# Patient Record
Sex: Female | Born: 1960 | Race: Black or African American | Hispanic: No | Marital: Single | Smoking: Former smoker
Health system: Southern US, Community
[De-identification: ages and names within clinical notes are randomized; demographics above are authoritative.]

## PROBLEM LIST (undated history)

## (undated) DIAGNOSIS — M5430 Sciatica, unspecified side: Secondary | ICD-10-CM

## (undated) DIAGNOSIS — I10 Essential (primary) hypertension: Secondary | ICD-10-CM

## (undated) DIAGNOSIS — D649 Anemia, unspecified: Secondary | ICD-10-CM

## (undated) DIAGNOSIS — R519 Headache, unspecified: Secondary | ICD-10-CM

## (undated) DIAGNOSIS — G4730 Sleep apnea, unspecified: Secondary | ICD-10-CM

## (undated) DIAGNOSIS — R011 Cardiac murmur, unspecified: Secondary | ICD-10-CM

## (undated) DIAGNOSIS — T8859XA Other complications of anesthesia, initial encounter: Secondary | ICD-10-CM

## (undated) DIAGNOSIS — K219 Gastro-esophageal reflux disease without esophagitis: Secondary | ICD-10-CM

## (undated) DIAGNOSIS — G5600 Carpal tunnel syndrome, unspecified upper limb: Secondary | ICD-10-CM

## (undated) DIAGNOSIS — M797 Fibromyalgia: Secondary | ICD-10-CM

## (undated) DIAGNOSIS — M1990 Unspecified osteoarthritis, unspecified site: Secondary | ICD-10-CM

## (undated) DIAGNOSIS — S299XXA Unspecified injury of thorax, initial encounter: Secondary | ICD-10-CM

## (undated) HISTORY — PX: ROTATOR CUFF REPAIR: SHX139

## (undated) HISTORY — PX: BREAST EXCISIONAL BIOPSY: SUR124

## (undated) HISTORY — PX: CARPAL TUNNEL RELEASE: SHX101

## (undated) HISTORY — DX: Fibromyalgia: M79.7

## (undated) HISTORY — PX: PARTIAL HYSTERECTOMY: SHX80

## (undated) HISTORY — PX: TUBAL LIGATION: SHX77

## (undated) HISTORY — PX: NECK SURGERY: SHX720

---

## 2008-11-29 ENCOUNTER — Encounter: Admission: RE | Admit: 2008-11-29 | Discharge: 2008-11-29 | Payer: Self-pay | Admitting: Otolaryngology

## 2008-11-29 IMAGING — CT CT PARANASAL SINUSES LIMITED
1 series · 8 of 10 positions shown, 10 images · non-contrast
Comparison: None.

CLINICAL DATA: Chronic sinusitis.  Headaches.  Facial swelling.
Status post septal perforation.

CT PARANASAL SINUS LIMITED WITHOUT CONTRAST
TECHNIQUE: Multidetector CT images of the paranasal sinuses were
obtained in a single plane without contrast.

[Series 3: coronal soft · axial · 0.33mm/px · z∈[+38,+108]mm · 8 of 10 slices shown, 10 images]
[im 2/10  brain]
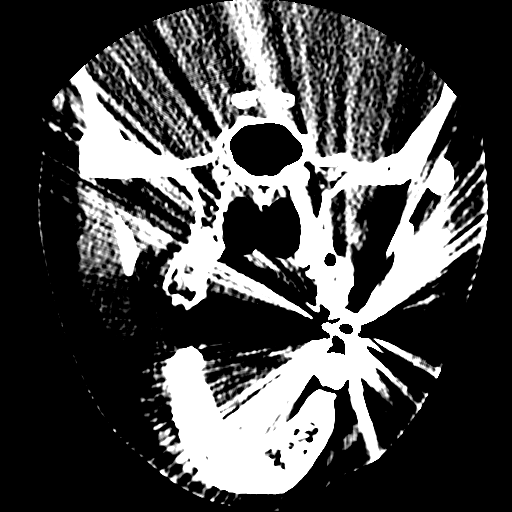
[im 2/10  bone]
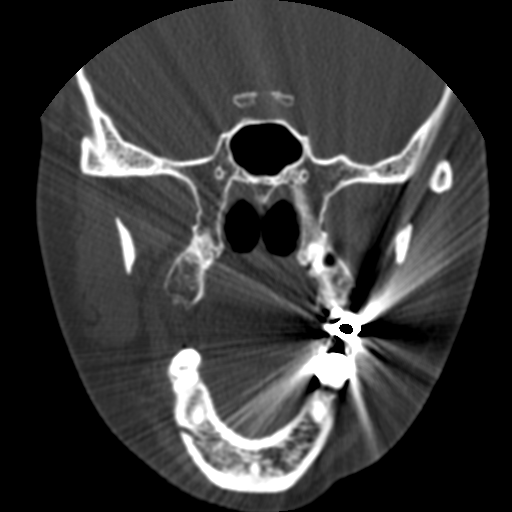
[im 3/10  bone]
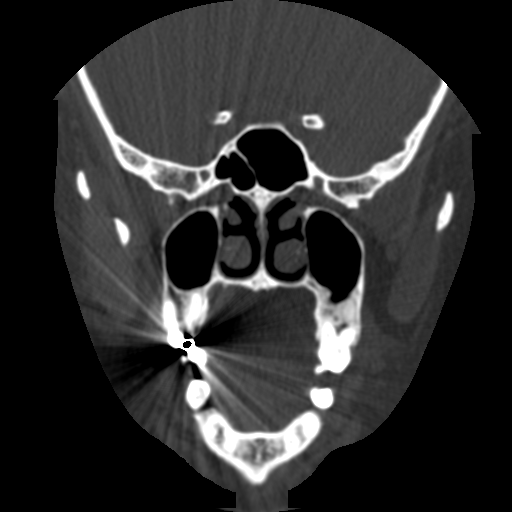
[im 4/10  bone]
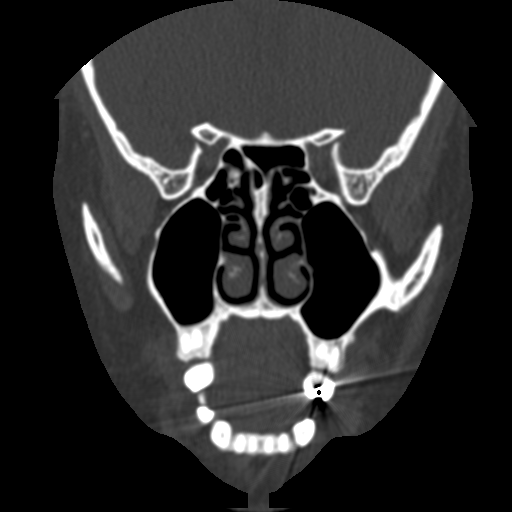
[im 5/10  bone]
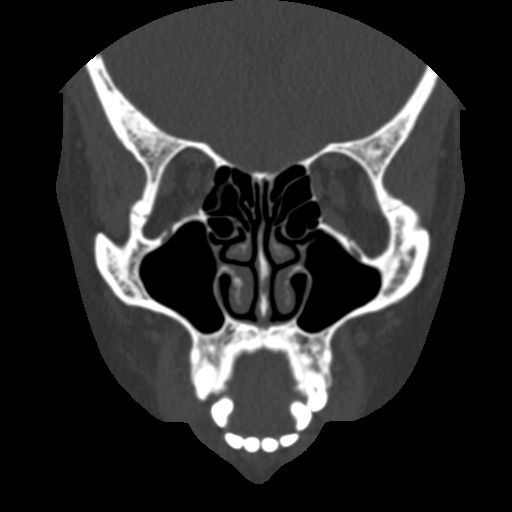
[im 6/10  brain]
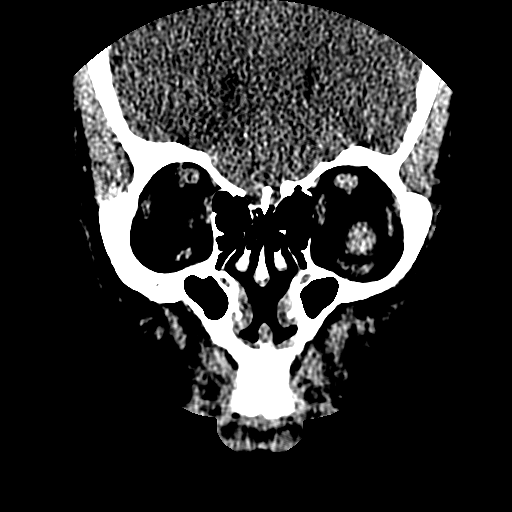
[im 6/10  bone]
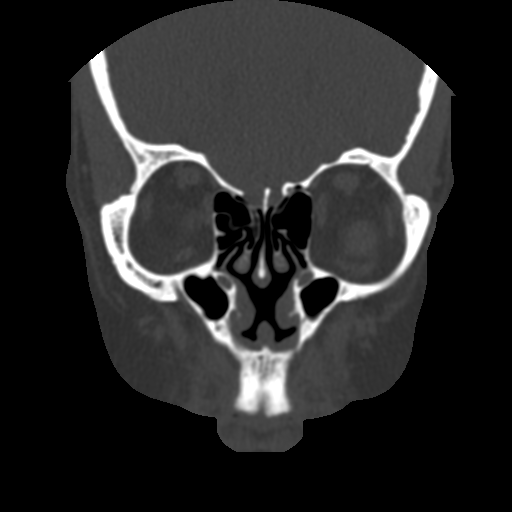
[im 7/10  bone]
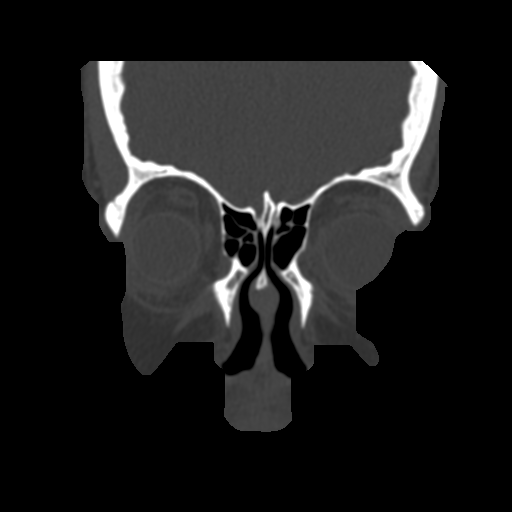
[im 8/10  bone]
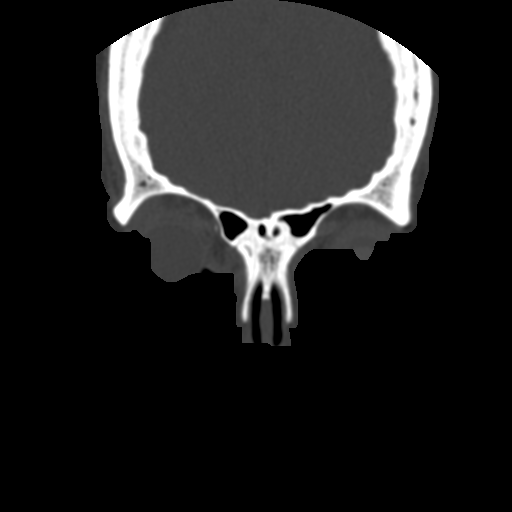
[im 9/10  bone]
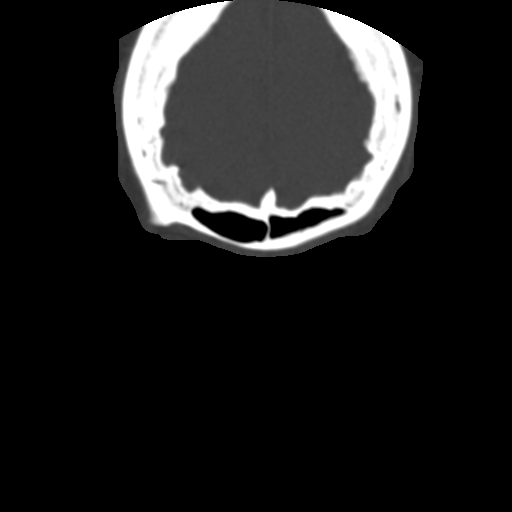

[8 of 10 positions shown; findings below may reference images not displayed]

FINDINGS: Visualized sinuses are clear.  Limited imaging of the
orbital structures and intracranial structures unremarkable.
Calcifications palatine tonsils consistent with prior inflammation.
IMPRESSION: Visualized sinuses are clear.

This has been made a call report.

## 2012-09-02 IMAGING — CR DG WRIST COMPLETE 3+V*R*
1 series · 4 of 4 positions shown · non-contrast
Comparison: None.

CLINICAL DATA: Bilateral wrist pain

EXAM:
RIGHT WRIST - COMPLETE 3+ VIEW

[Series 1: pa · 0.17mm/px · 4 of 4 slices shown]
[im 1/4]
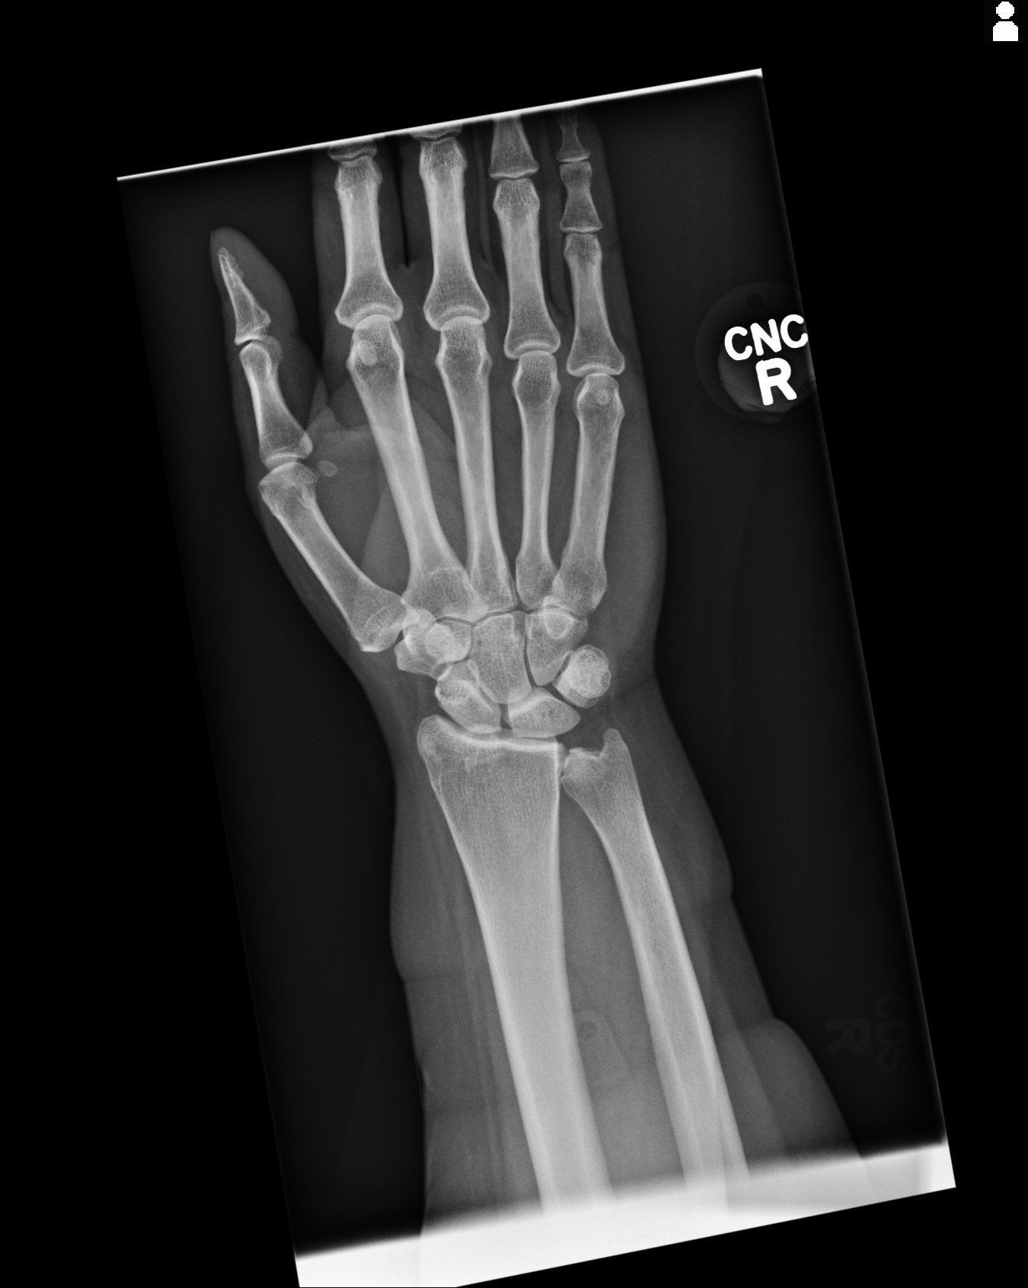
[im 2/4]
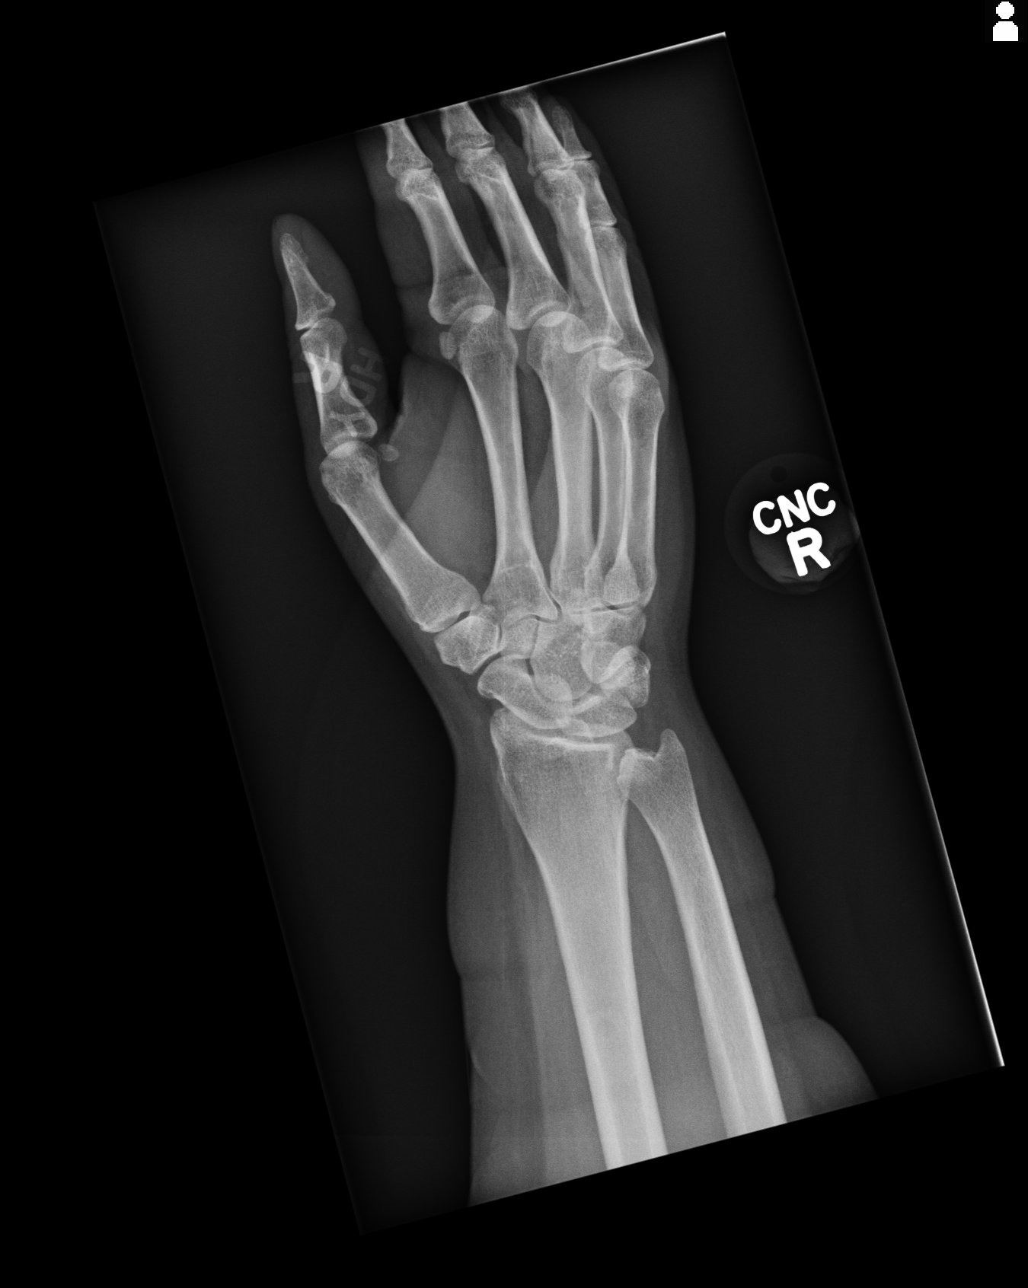
[im 3/4]
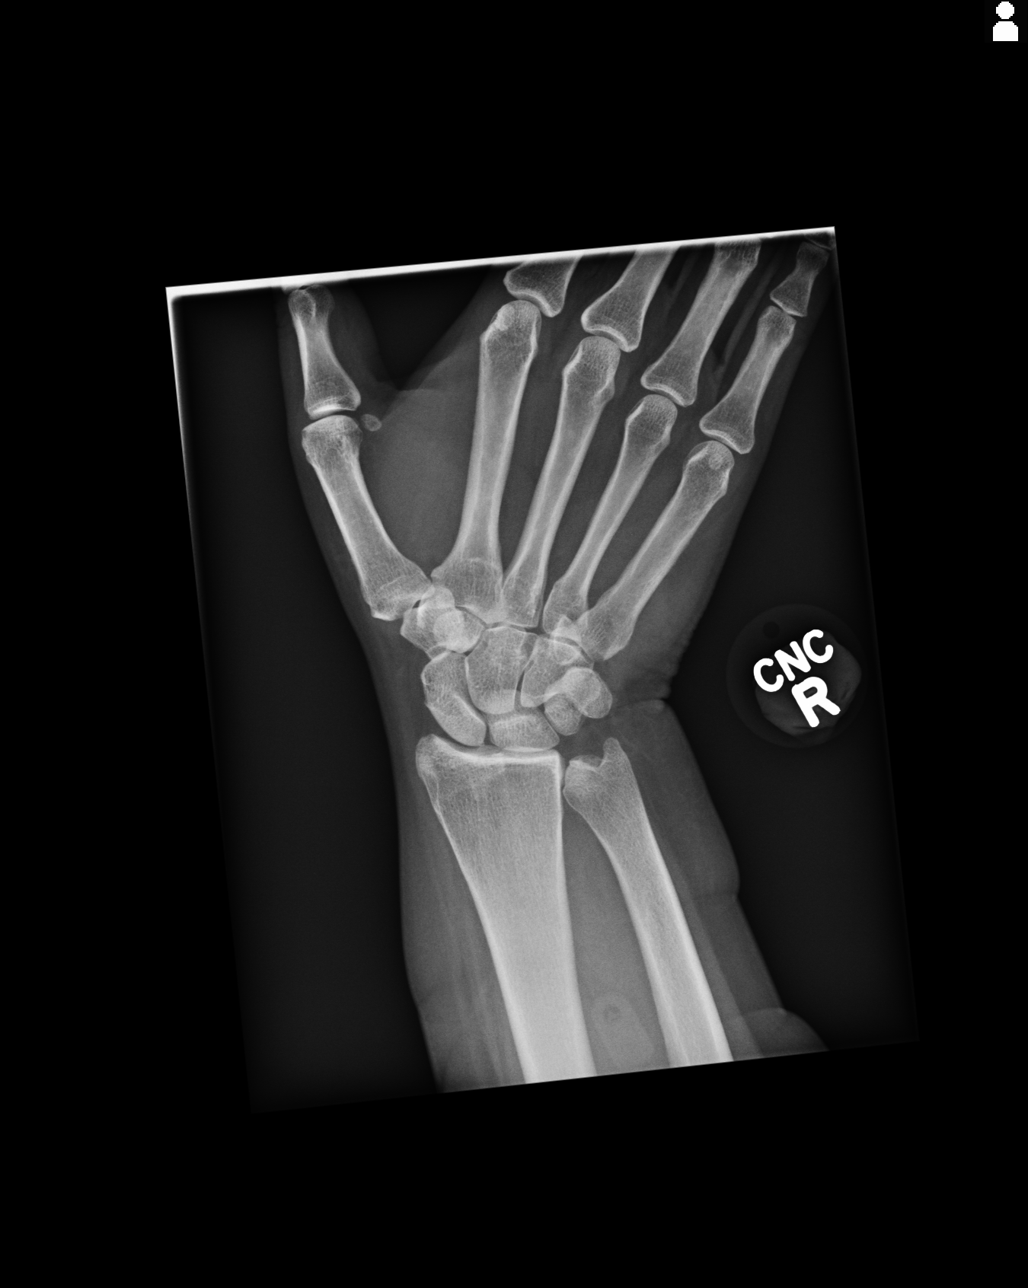
[im 4/4]
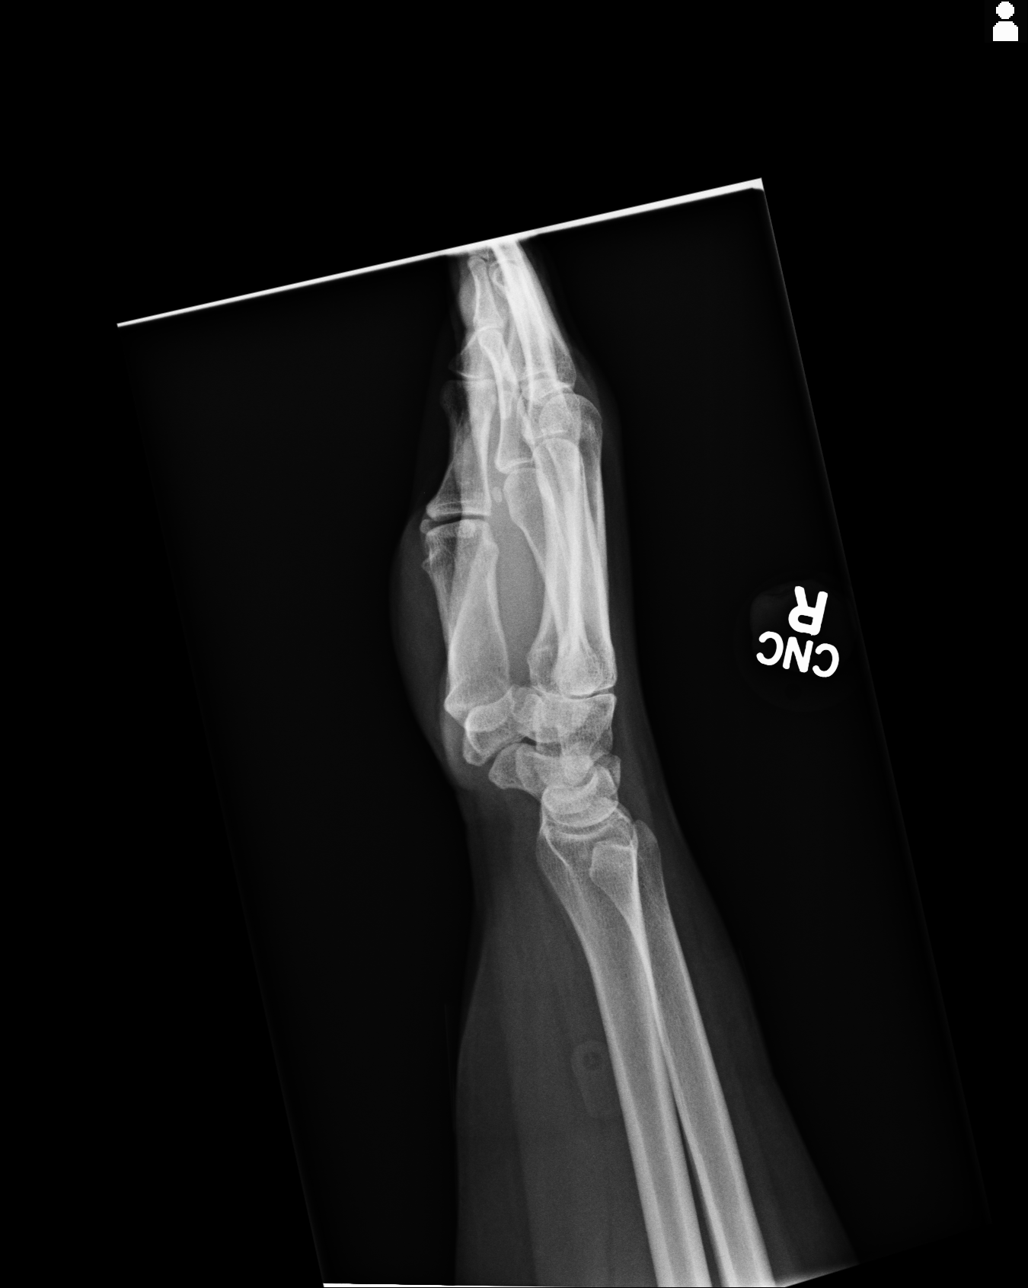

[4 of 4 positions shown; findings below may reference images not displayed]

FINDINGS: No acute fracture or dislocation is noted. No gross soft tissue
abnormality is seen. Slight ulnar minus variant is noted.
IMPRESSION: No acute abnormality seen.

## 2013-05-04 ENCOUNTER — Ambulatory Visit: Payer: Self-pay

## 2013-05-04 IMAGING — CR LEFT WRIST - COMPLETE 3+ VIEW
1 series · 4 of 4 positions shown · non-contrast
Comparison: None.

CLINICAL DATA: Left wrist pain

EXAM:
LEFT WRIST - COMPLETE 3+ VIEW

[Series 1: pa · 0.17mm/px · 4 of 4 slices shown]
[im 1/4]
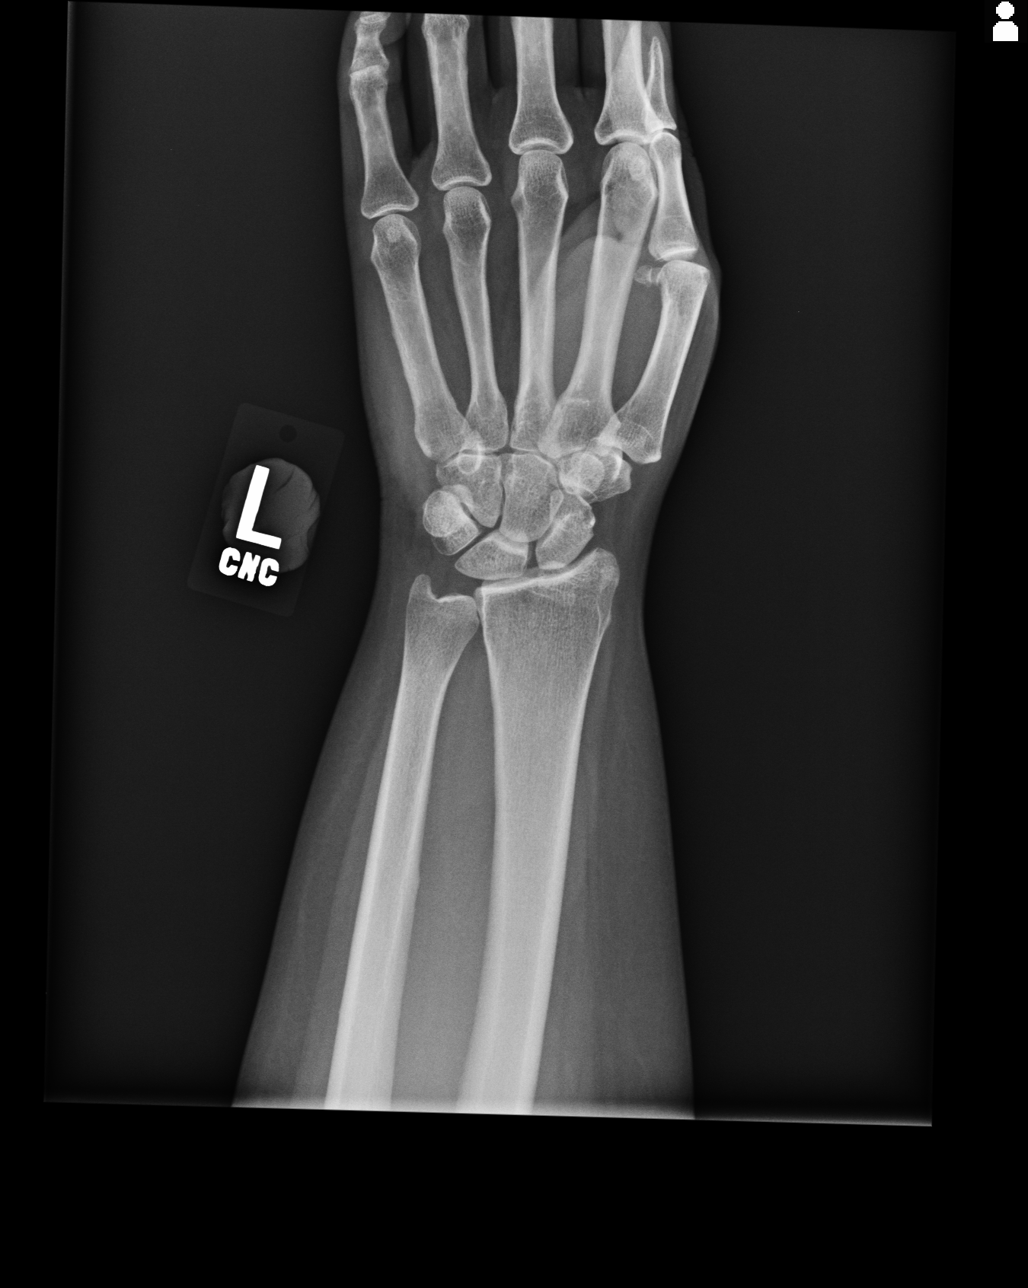
[im 2/4]
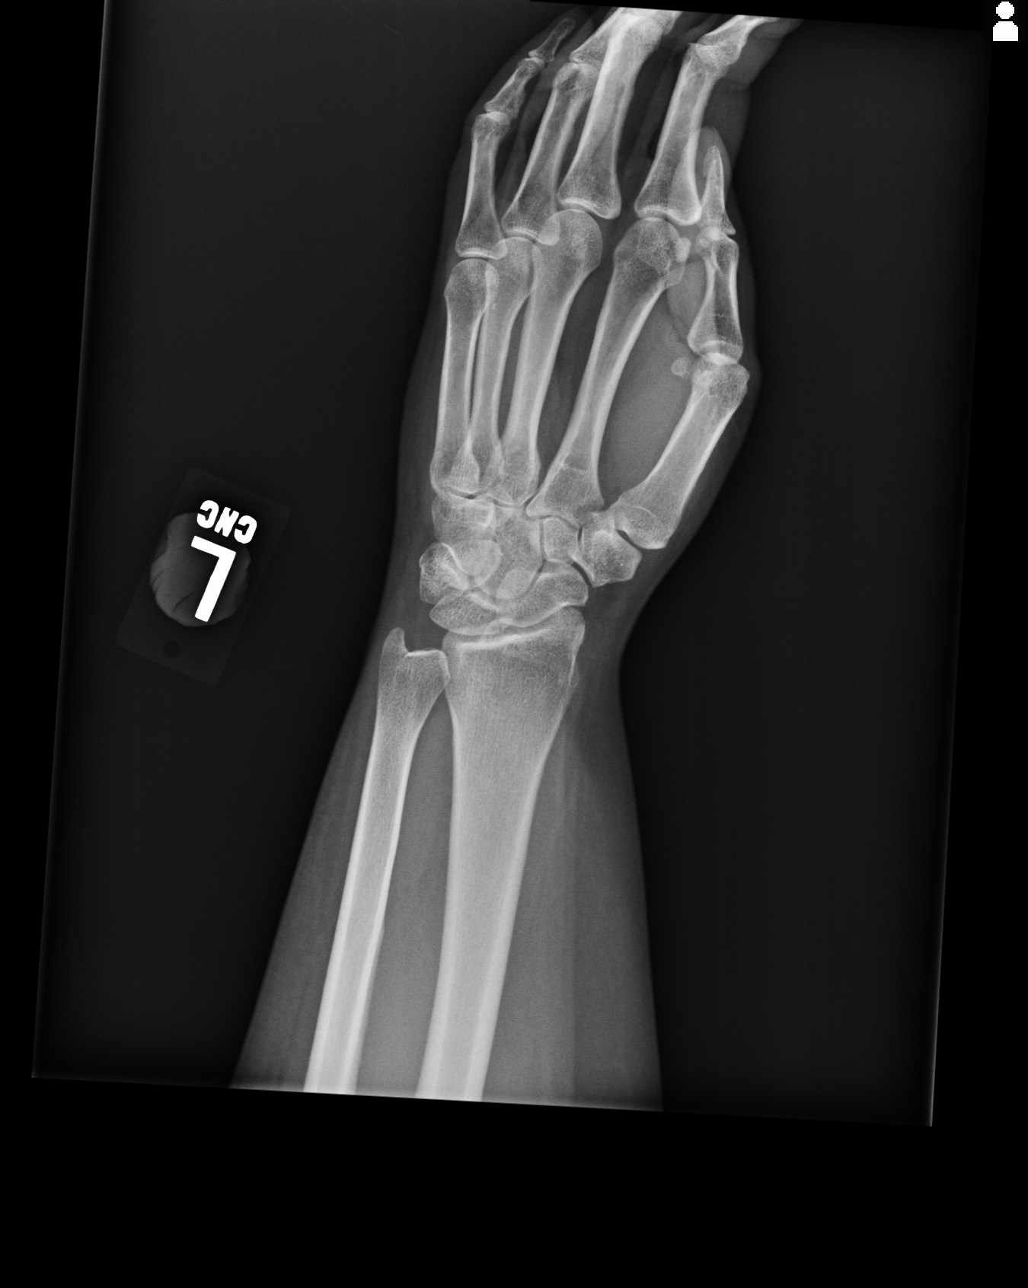
[im 3/4]
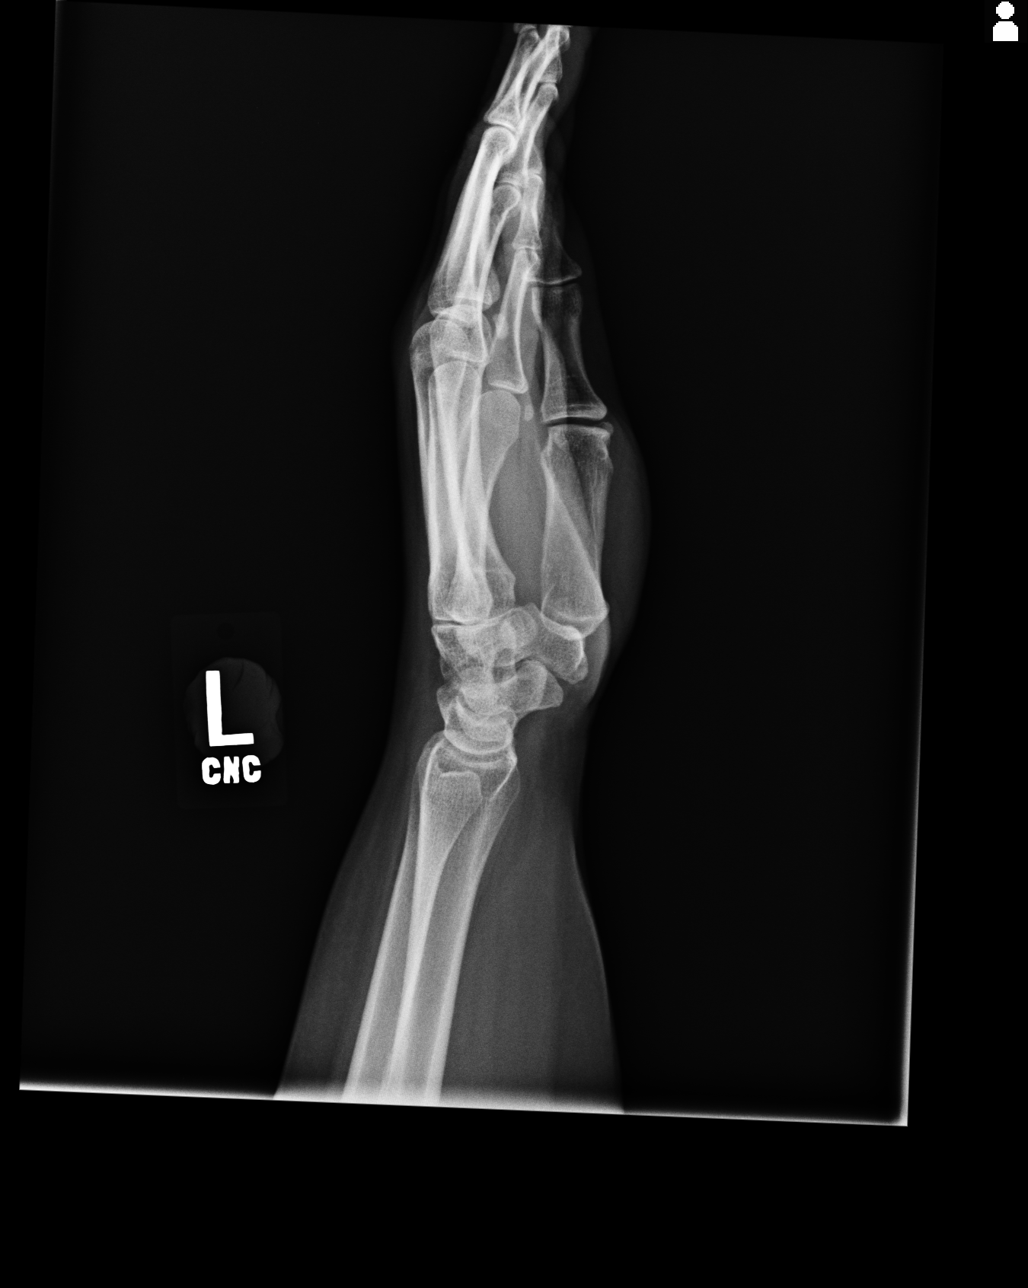
[im 4/4]
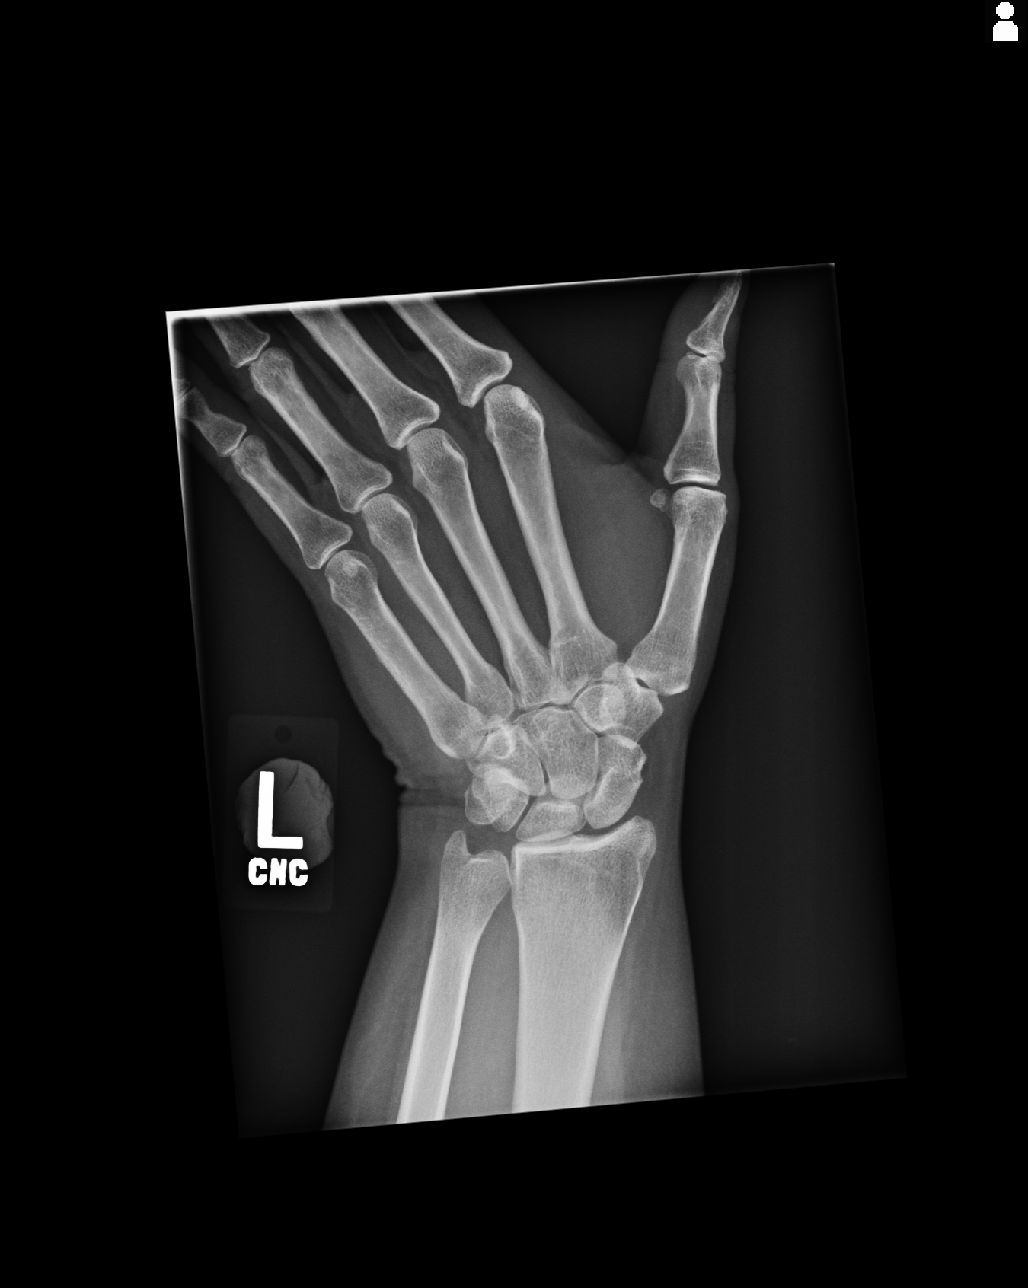

[4 of 4 positions shown; findings below may reference images not displayed]

FINDINGS: There is no evidence of fracture or dislocation. There is no
evidence of arthropathy or other focal bone abnormality. Soft
tissues are unremarkable.
IMPRESSION: Negative.

## 2019-04-01 ENCOUNTER — Other Ambulatory Visit (HOSPITAL_COMMUNITY): Payer: Self-pay | Admitting: Family Medicine

## 2019-04-01 DIAGNOSIS — Z1231 Encounter for screening mammogram for malignant neoplasm of breast: Secondary | ICD-10-CM

## 2019-04-15 ENCOUNTER — Ambulatory Visit (HOSPITAL_COMMUNITY)
Admission: RE | Admit: 2019-04-15 | Discharge: 2019-04-15 | Disposition: A | Discharge status: Home / Self Care | Payer: Medicare Other | Source: Ambulatory Visit | Attending: Family Medicine | Admitting: Family Medicine

## 2019-04-15 ENCOUNTER — Encounter (HOSPITAL_COMMUNITY): Payer: Self-pay | Admitting: Radiology

## 2019-04-15 DIAGNOSIS — Z1231 Encounter for screening mammogram for malignant neoplasm of breast: Secondary | ICD-10-CM | POA: Insufficient documentation

## 2019-04-15 IMAGING — MG DIGITAL SCREENING BILAT W/ TOMO W/ CAD
8 series · 8 of 24 positions shown · non-contrast
Comparison: Previous exam(s).

CLINICAL DATA: Screening.

EXAM:
DIGITAL SCREENING BILATERAL MAMMOGRAM WITH TOMO AND CAD

[R MLO synth-2D]
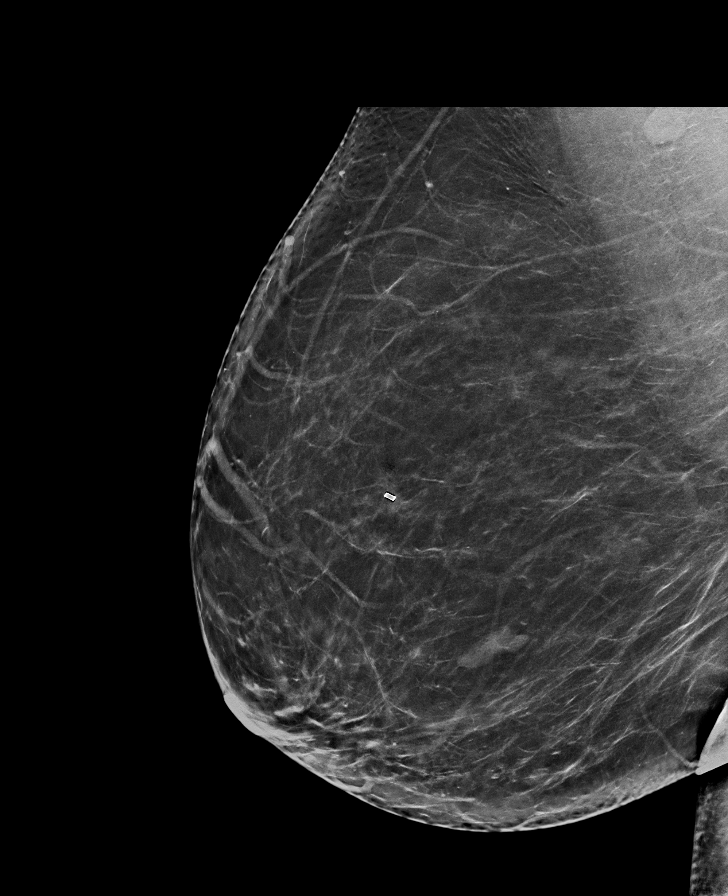

[L MLO synth-2D]
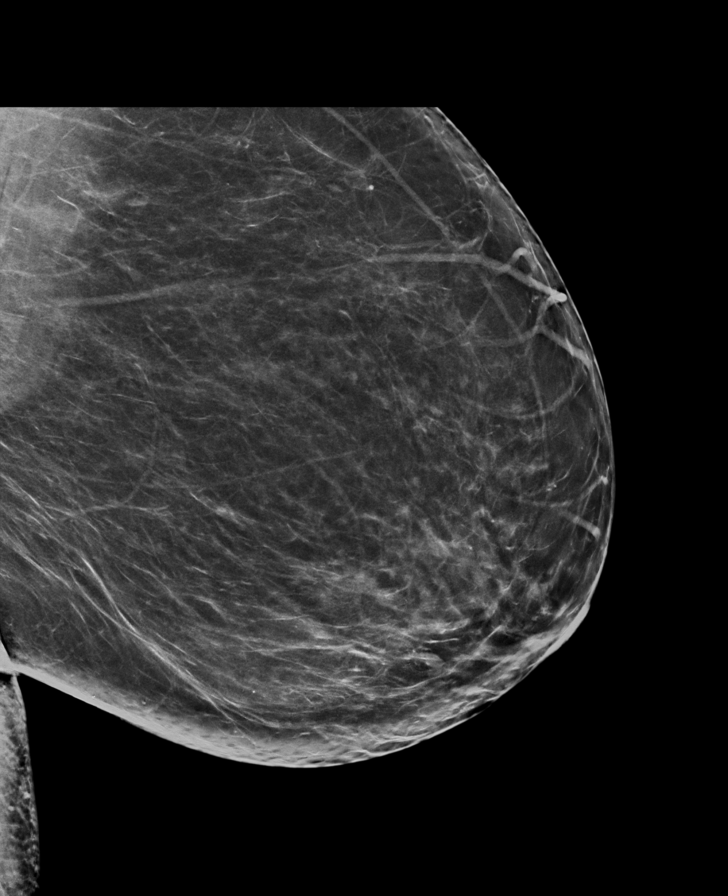

[L CC synth-2D]
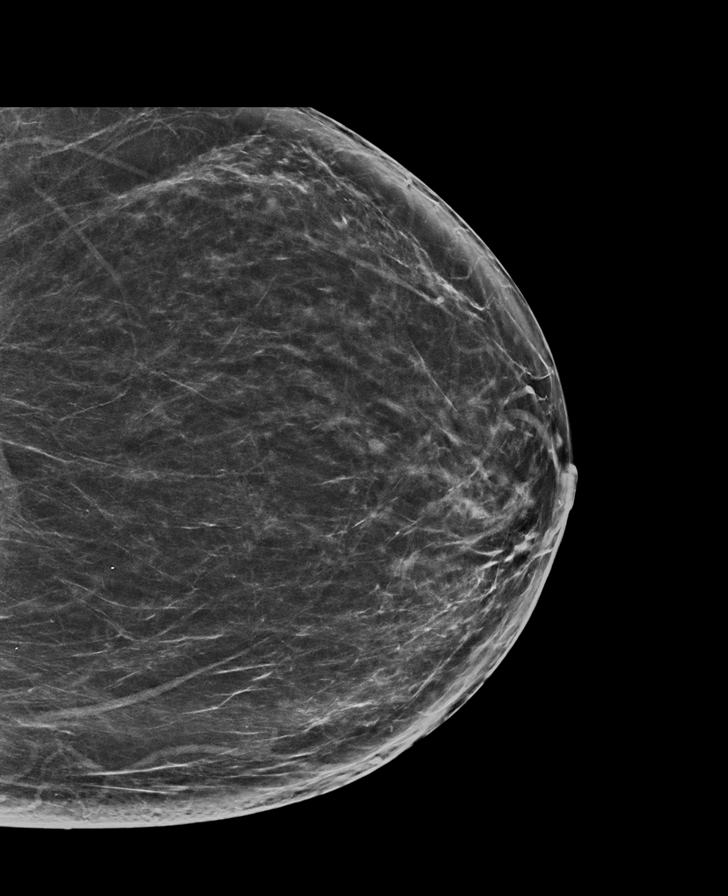

[R CC synth-2D]
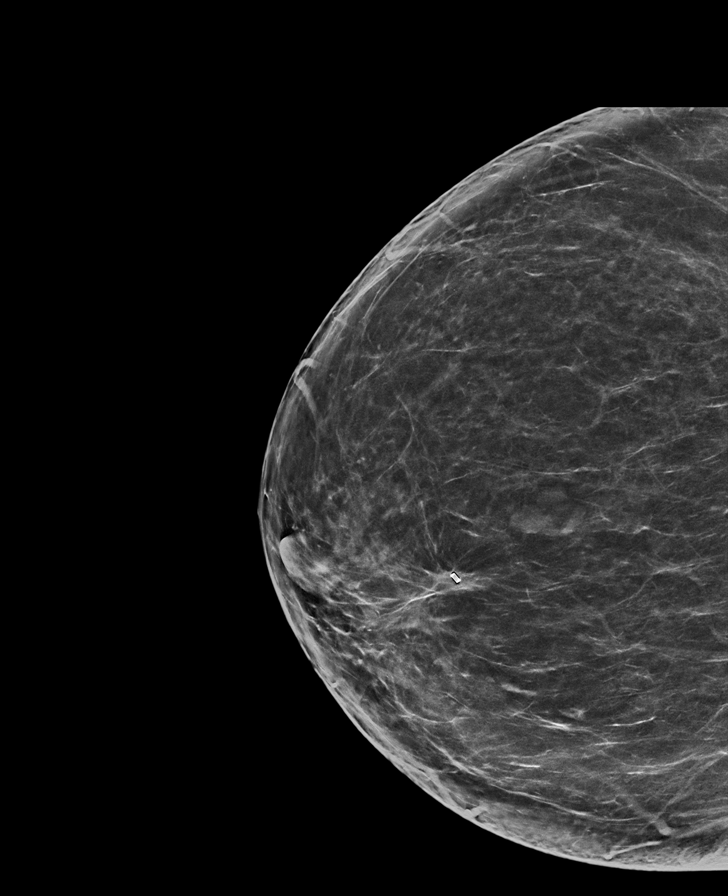

[R MLO tomo · tomo slice 44/87.0]
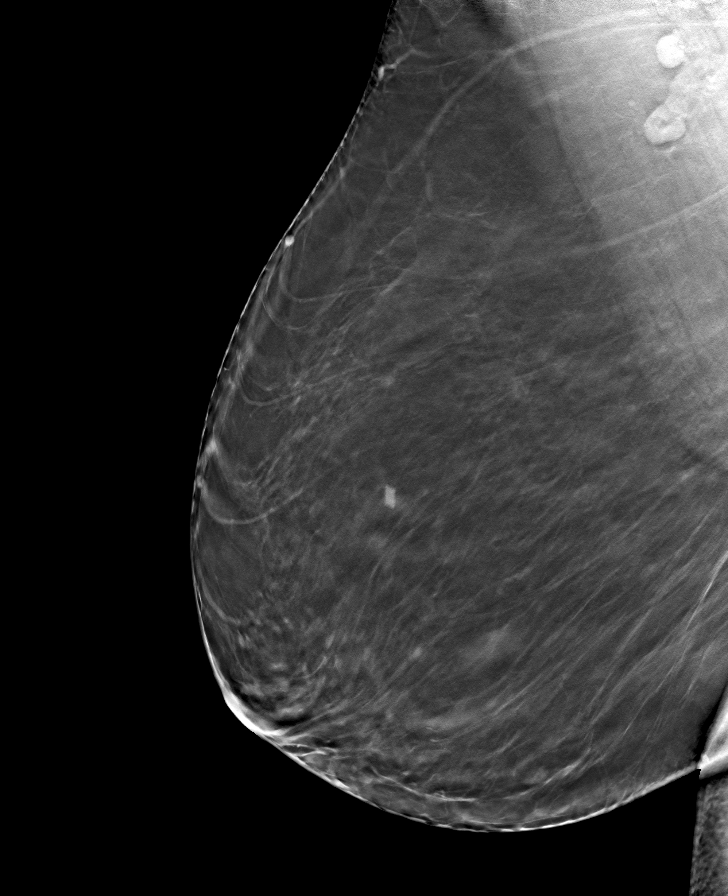

[R CC tomo · tomo slice 41/82.0]
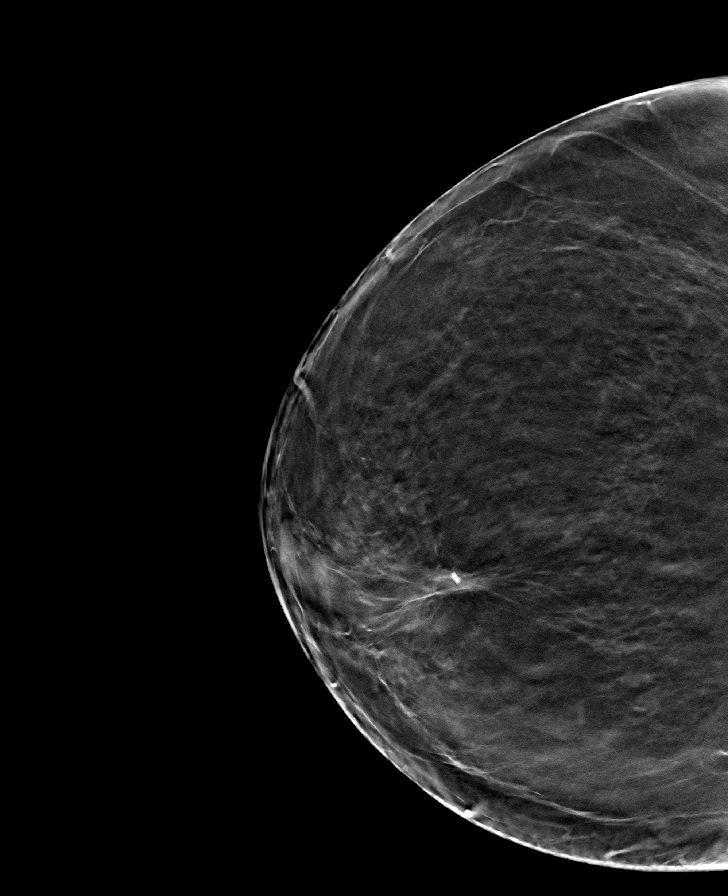

[L CC tomo · tomo slice 41/82.0]
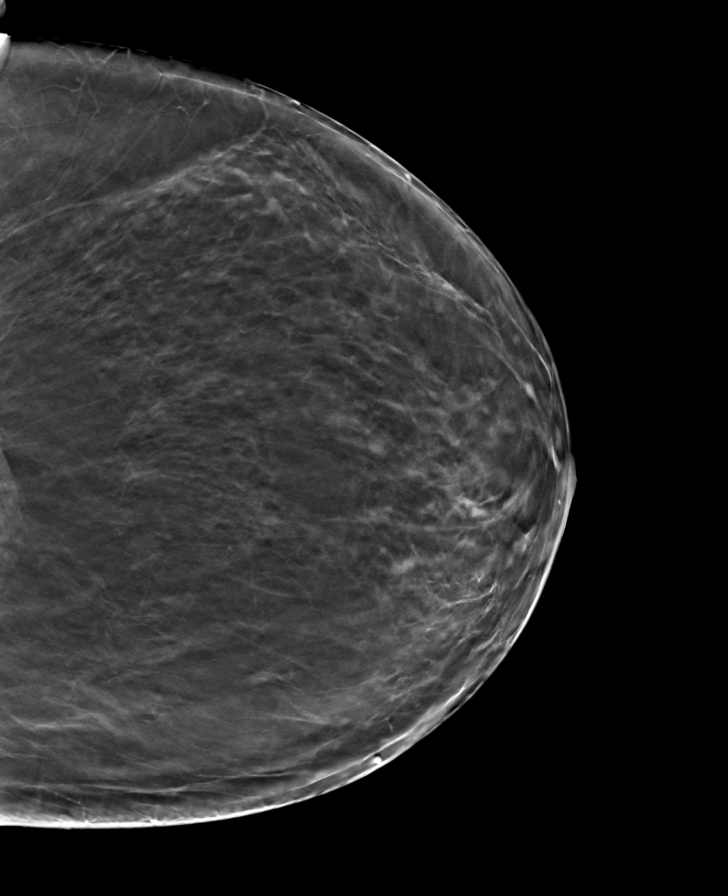

[L MLO tomo · tomo slice 43/85.0]
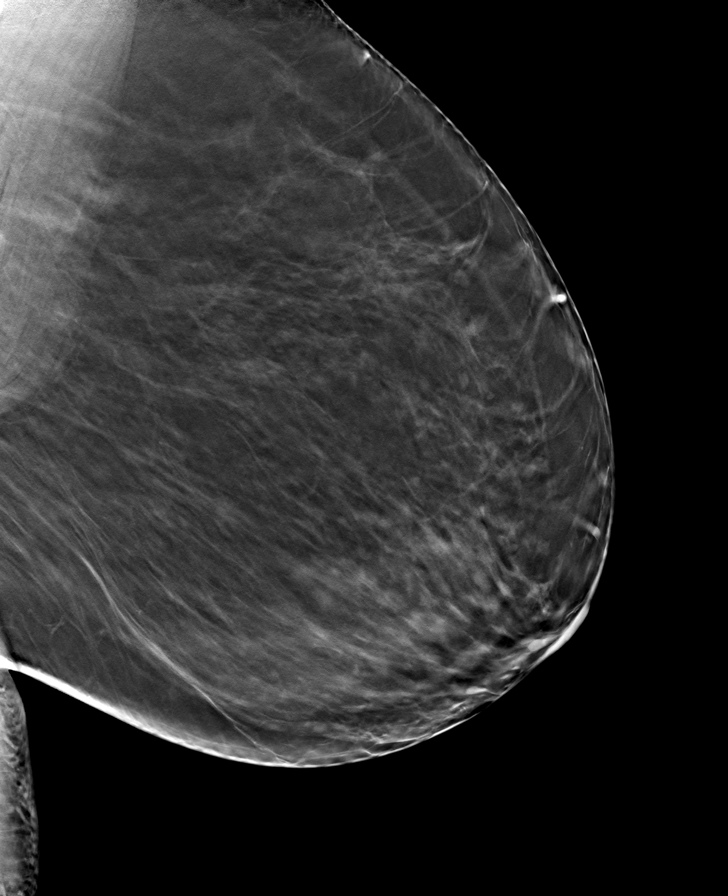

[8 of 24 positions shown; findings below may reference images not displayed]

ACR Breast Density Category b: There are scattered areas of
fibroglandular density.
FINDINGS: There are no findings suspicious for malignancy. Images were
processed with CAD.
IMPRESSION: No mammographic evidence of malignancy. A result letter of this
screening mammogram will be mailed directly to the patient.

RECOMMENDATION:
Screening mammogram in one year. (Code:[TQ])

BI-RADS CATEGORY  1: Negative.

## 2019-04-16 ENCOUNTER — Inpatient Hospital Stay
Admission: RE | Admit: 2019-04-16 | Discharge: 2019-04-16 | Disposition: A | Discharge status: Home / Self Care | Payer: Self-pay | Source: Ambulatory Visit | Attending: Family Medicine | Admitting: Family Medicine

## 2019-04-16 ENCOUNTER — Other Ambulatory Visit (HOSPITAL_COMMUNITY): Payer: Self-pay | Admitting: Family Medicine

## 2019-04-16 DIAGNOSIS — Z1231 Encounter for screening mammogram for malignant neoplasm of breast: Secondary | ICD-10-CM

## 2019-04-21 ENCOUNTER — Ambulatory Visit: Payer: Medicare Other

## 2019-04-21 ENCOUNTER — Encounter: Payer: Self-pay | Admitting: Orthopaedic Surgery

## 2019-04-21 ENCOUNTER — Ambulatory Visit (INDEPENDENT_AMBULATORY_CARE_PROVIDER_SITE_OTHER): Payer: Medicare Other | Admitting: Orthopaedic Surgery

## 2019-04-21 ENCOUNTER — Other Ambulatory Visit: Payer: Self-pay

## 2019-04-21 VITALS — BP 108/80 | HR 64 | Temp 96.6°F | Ht 66.0 in | Wt 226.0 lb

## 2019-04-21 DIAGNOSIS — M542 Cervicalgia: Secondary | ICD-10-CM | POA: Diagnosis not present

## 2019-04-21 NOTE — Progress Notes (Signed)
Subjective:    Patient ID: Kara Jimenez, female    DOB: [birth year], 59 y.o.   MRN: [id number]  HPI She complains of pain in the right anterior neck, the right shoulder, pain running to the right elbow and into the right hand and dorsum of the right index finger.  She has been seen in [place] at the center there and has had x-rays.  She brings in X-rays of the shoulder and right elbow.  I have independently reviewed and interpreted x-rays of this patient done at another site by another physician or qualified health professional.  She has no trauma.  She has no swelling.  She has no redness.  She had surgery on the neck in 2019 at [facility].  She has not been back since she recovered.    She has diagnosis of fibromyalgia.  She has intolerance of NSAIDs.  She is post carpal tunnel release and rotator cuff surgery.  She has completed a course of prednisone recently with no help.  She has not been back to [facility] as she says she has changed insurance and that doctor is no longer under her plan.   Review of Systems  Constitutional: Positive for activity change.  Musculoskeletal: Positive for arthralgias, myalgias and neck pain.  All other systems reviewed and are negative.  For Review of Systems, all other systems reviewed and are negative.  The following is a summary of the past history medically, past history surgically, known current medicines, social history and family history.  This information is gathered electronically by the computer from prior information and documentation.  I review this each visit and have found including this information at this point in the chart is beneficial and informative.   Past Medical History:  Diagnosis Date  . Fibromyalgia     Past Surgical History:  Procedure Laterality Date  . BREAST EXCISIONAL BIOPSY Right    benign  . CARPAL TUNNEL RELEASE Bilateral   . CESAREAN SECTION    . NECK SURGERY    . PARTIAL HYSTERECTOMY    .  ROTATOR CUFF REPAIR Left   . TUBAL LIGATION      Current Outpatient Medications on File Prior to Visit  Medication Sig Dispense Refill  . acetaminophen (TYLENOL) 500 MG tablet Take 500 mg by mouth every 6 (six) hours as needed.    . Brinzolamide-Brimonidine (SIMBRINZA) 1-0.2 % SUSP      No current facility-administered medications on file prior to visit.    Social History   Socioeconomic History  . Marital status: Single    Spouse name: Not on file  . Number of children: Not on file  . Years of education: Not on file  . Highest education level: Not on file  Occupational History  . Not on file  Tobacco Use  . Smoking status: Never Smoker  . Smokeless tobacco: Never Used  Substance and Sexual Activity  . Alcohol use: Not Currently  . Drug use: Never  . Sexual activity: Not on file  Other Topics Concern  . Not on file  Social History Narrative  . Not on file   Social Determinants of Health   Financial Resource Strain:   . Difficulty of Paying Living Expenses:   Food Insecurity:   . Worried About Charity fundraiser in the Last Year:   . Arboriculturist in the Last Year:   Transportation Needs:   . Film/video editor (Medical):   Marland Kitchen Lack of Transportation (  Non-Medical):   Physical Activity:   . Days of Exercise per Week:   . Minutes of Exercise per Session:   Stress:   . Feeling of Stress :   Social Connections:   . Frequency of Communication with Friends and Family:   . Frequency of Social Gatherings with Friends and Family:   . Attends Religious Services:   . Active Member of Clubs or Organizations:   . Attends Archivist Meetings:   Marland Kitchen Marital Status:   Intimate Partner Violence:   . Fear of Current or Ex-Partner:   . Emotionally Abused:   Marland Kitchen Physically Abused:   . Sexually Abused:     Family History  Problem Relation Age of Onset  . HIV/AIDS Mother   . Hypertension Father     BP 108/80   Pulse 64   Temp (!) 96.6 F (35.9 C)   Ht 5'  6" (1.676 m)   Wt 226 lb (102.5 kg)   LMP 04/15/2019   BMI 36.48 kg/m   Body mass index is 36.48 kg/m.     Objective:   Physical Exam Vitals and nursing note reviewed.  Constitutional:      Appearance: She is well-developed.  HENT:     Head: Normocephalic and atraumatic.  Eyes:     Conjunctiva/sclera: Conjunctivae normal.     Pupils: Pupils are equal, round, and reactive to light.  Cardiovascular:     Rate and Rhythm: Normal rate and regular rhythm.  Pulmonary:     Effort: Pulmonary effort is normal.  Abdominal:     Palpations: Abdomen is soft.  Musculoskeletal:       Arms:     Cervical back: Normal range of motion and neck supple.  Skin:    General: Skin is warm and dry.  Neurological:     Mental Status: She is alert and oriented to person, place, and time.     Cranial Nerves: No cranial nerve deficit.     Motor: No abnormal muscle tone.     Coordination: Coordination normal.     Deep Tendon Reflexes: Reflexes are normal and symmetric. Reflexes normal.  Psychiatric:        Behavior: Behavior normal.        Thought Content: Thought content normal.        Judgment: Judgment normal.      X-rays were done of the cervical spine, reported separately.     Assessment & Plan:   Encounter Diagnosis  Name Primary?  . Neck pain on right side Yes   I have told her that I feel her pain is from the neck and not just the shoulder and then just the elbow.  I have asked her to see her neurosurgeon.  If insurance will not cover that doctor, find out which group it will cover and we can refer for further evaluation.  I cannot give NSAIDs.  She just finished prednisone.  See as needed.  Electronically Signed Sanjuana Kava, MD 4/6/202111:13 AM

## 2019-04-22 ENCOUNTER — Telehealth: Payer: Self-pay | Admitting: Orthopaedic Surgery

## 2019-04-22 NOTE — Telephone Encounter (Signed)
Patient called to relay that she is awaiting a call back from her [place] as to whether Dr March Rummage, her previous neurosurgeon, regarding surgery in 2017, is in network. Aware, as discussed with Dr Luna Glasgow at time of visit, if not, that he will make a referral to another neurosurgery clinic. Patient also aware in that case, that she would need to request records from Dr March Rummage.

## 2019-04-27 ENCOUNTER — Telehealth: Payer: Self-pay | Admitting: Orthopaedic Surgery

## 2019-04-27 NOTE — Telephone Encounter (Signed)
Patient called back to relay that she has checked back with her previous neurosurgeon and that their office does not work with her insurance. She is asking to be referred to another neurosurgeon. Aware that any other neurosurgery clinic will lilely need her records from previous surgery, imaging reports, and films. Said she has started working on requesting.

## 2019-04-30 NOTE — Telephone Encounter (Signed)
OK. Set her up where they can see her to have her insurance cover her.

## 2019-05-18 NOTE — Telephone Encounter (Signed)
Patient aware. [employer] is assisting her; aware referral can be made to a clinic of insurer/patient's choice.

## 2019-05-18 NOTE — Telephone Encounter (Signed)
Patient called back to relay that her insurer, [employer], relays that neurosurgeon Dr Gara Kroner, [place], [place], [place], affiliate of [facility], ph# [phone].

## 2019-05-21 ENCOUNTER — Other Ambulatory Visit: Payer: Self-pay | Admitting: Orthopaedic Surgery

## 2019-05-21 ENCOUNTER — Telehealth: Payer: Self-pay

## 2019-05-21 DIAGNOSIS — M542 Cervicalgia: Secondary | ICD-10-CM

## 2019-05-21 NOTE — Telephone Encounter (Signed)
Patient called saying that her insurer, [employer] told her that Dr. Bayard Hugger, [address] [place], [place] affiliate of [facility], ph#: [phone]

## 2019-05-21 NOTE — Telephone Encounter (Signed)
I have contacted patient again to be sure she understands that the doctor will more than likely need to review the imaging on a cd as well as the report. She stated she wasn't sure about that when she got her records but that she will get the images put on a cd. I told her that as soon as we get the referral signed I will fax the necessary paperwork from our office and contact her to let her know its been faxed, she agrees with this.

## 2019-05-28 NOTE — Telephone Encounter (Signed)
Per chart notes, referral was sent electronically via [employer] portal 05/25/19 to neurosurgeon noted, Dr Era Bumpers, [facility]. Patient aware.

## 2019-06-02 ENCOUNTER — Telehealth: Payer: Self-pay | Admitting: Orthopaedic Surgery

## 2019-06-02 NOTE — Telephone Encounter (Signed)
Called patient and she gave me another doctor's name at the [facility] in [place].  Dr. Deetta Perla at [facility] (Neurosurgery) [place], [place]  She stated that her insurance carrier gave her this doctor's name and number. I have faxed her information to htem.

## 2019-06-02 NOTE — Telephone Encounter (Signed)
Patient called and wanted to know about referral.  Please call her at [phone]  Thanks

## 2019-06-09 ENCOUNTER — Other Ambulatory Visit: Payer: Self-pay

## 2019-06-09 ENCOUNTER — Emergency Department (HOSPITAL_COMMUNITY)
Admission: EM | Admit: 2019-06-09 | Discharge: 2019-06-09 | Disposition: A | Discharge status: Home / Self Care | Payer: Medicare Other | Attending: Emergency Medicine | Admitting: Emergency Medicine

## 2019-06-09 ENCOUNTER — Encounter (HOSPITAL_COMMUNITY): Payer: Self-pay

## 2019-06-09 ENCOUNTER — Emergency Department (HOSPITAL_COMMUNITY): Payer: Medicare Other

## 2019-06-09 DIAGNOSIS — R0789 Other chest pain: Secondary | ICD-10-CM | POA: Insufficient documentation

## 2019-06-09 DIAGNOSIS — I10 Essential (primary) hypertension: Secondary | ICD-10-CM | POA: Insufficient documentation

## 2019-06-09 DIAGNOSIS — Z79899 Other long term (current) drug therapy: Secondary | ICD-10-CM | POA: Insufficient documentation

## 2019-06-09 DIAGNOSIS — R079 Chest pain, unspecified: Secondary | ICD-10-CM

## 2019-06-09 HISTORY — DX: Sleep apnea, unspecified: G47.30

## 2019-06-09 HISTORY — DX: Unspecified injury of thorax, initial encounter: S29.9XXA

## 2019-06-09 HISTORY — DX: Unspecified osteoarthritis, unspecified site: M19.90

## 2019-06-09 HISTORY — DX: Carpal tunnel syndrome, unspecified upper limb: G56.00

## 2019-06-09 HISTORY — DX: Sciatica, unspecified side: M54.30

## 2019-06-09 LAB — CBC WITH DIFFERENTIAL/PLATELET
Abs Immature Granulocytes: 0.02 10*3/uL (ref 0.00–0.07)
Basophils Absolute: 0 10*3/uL (ref 0.0–0.1)
Basophils Relative: 0 %
Eosinophils Absolute: 0 10*3/uL (ref 0.0–0.5)
Eosinophils Relative: 0 %
HCT: 43.1 % (ref 36.0–46.0)
Hemoglobin: 14.2 g/dL (ref 12.0–15.0)
Immature Granulocytes: 0 %
Lymphocytes Relative: 34 %
Lymphs Abs: 3.1 10*3/uL (ref 0.7–4.0)
MCH: 31.5 pg (ref 26.0–34.0)
MCHC: 32.9 g/dL (ref 30.0–36.0)
MCV: 95.6 fL (ref 80.0–100.0)
Monocytes Absolute: 0.4 10*3/uL (ref 0.1–1.0)
Monocytes Relative: 4 %
Neutro Abs: 5.6 10*3/uL (ref 1.7–7.7)
Neutrophils Relative %: 62 %
Platelets: 236 10*3/uL (ref 150–400)
RBC: 4.51 MIL/uL (ref 3.87–5.11)
RDW: 13.1 % (ref 11.5–15.5)
WBC: 9.1 10*3/uL (ref 4.0–10.5)
nRBC: 0 % (ref 0.0–0.2)

## 2019-06-09 LAB — BASIC METABOLIC PANEL
Anion gap: 12 (ref 5–15)
BUN: 24 mg/dL — ABNORMAL HIGH (ref 6–20)
CO2: 23 mmol/L (ref 22–32)
Calcium: 9.3 mg/dL (ref 8.9–10.3)
Chloride: 104 mmol/L (ref 98–111)
Creatinine, Ser: 0.74 mg/dL (ref 0.44–1.00)
GFR calc Af Amer: >60 mL/min (ref >60)
GFR calc non Af Amer: >60 mL/min (ref >60)
Glucose, Bld: 109 mg/dL — ABNORMAL HIGH (ref 70–99)
Potassium: 3.6 mmol/L (ref 3.5–5.1)
Sodium: 139 mmol/L (ref 135–145)

## 2019-06-09 LAB — TROPONIN I (HIGH SENSITIVITY)
Troponin I (High Sensitivity): 3 ng/L (ref <18)
Troponin I (High Sensitivity): 3 ng/L (ref <18)

## 2019-06-09 IMAGING — DX DG CHEST 2V
2 series · 2 of 2 positions shown · non-contrast
Comparison: None.

CLINICAL DATA: Chest pain

EXAM:
CHEST - 2 VIEW

[chest pa]
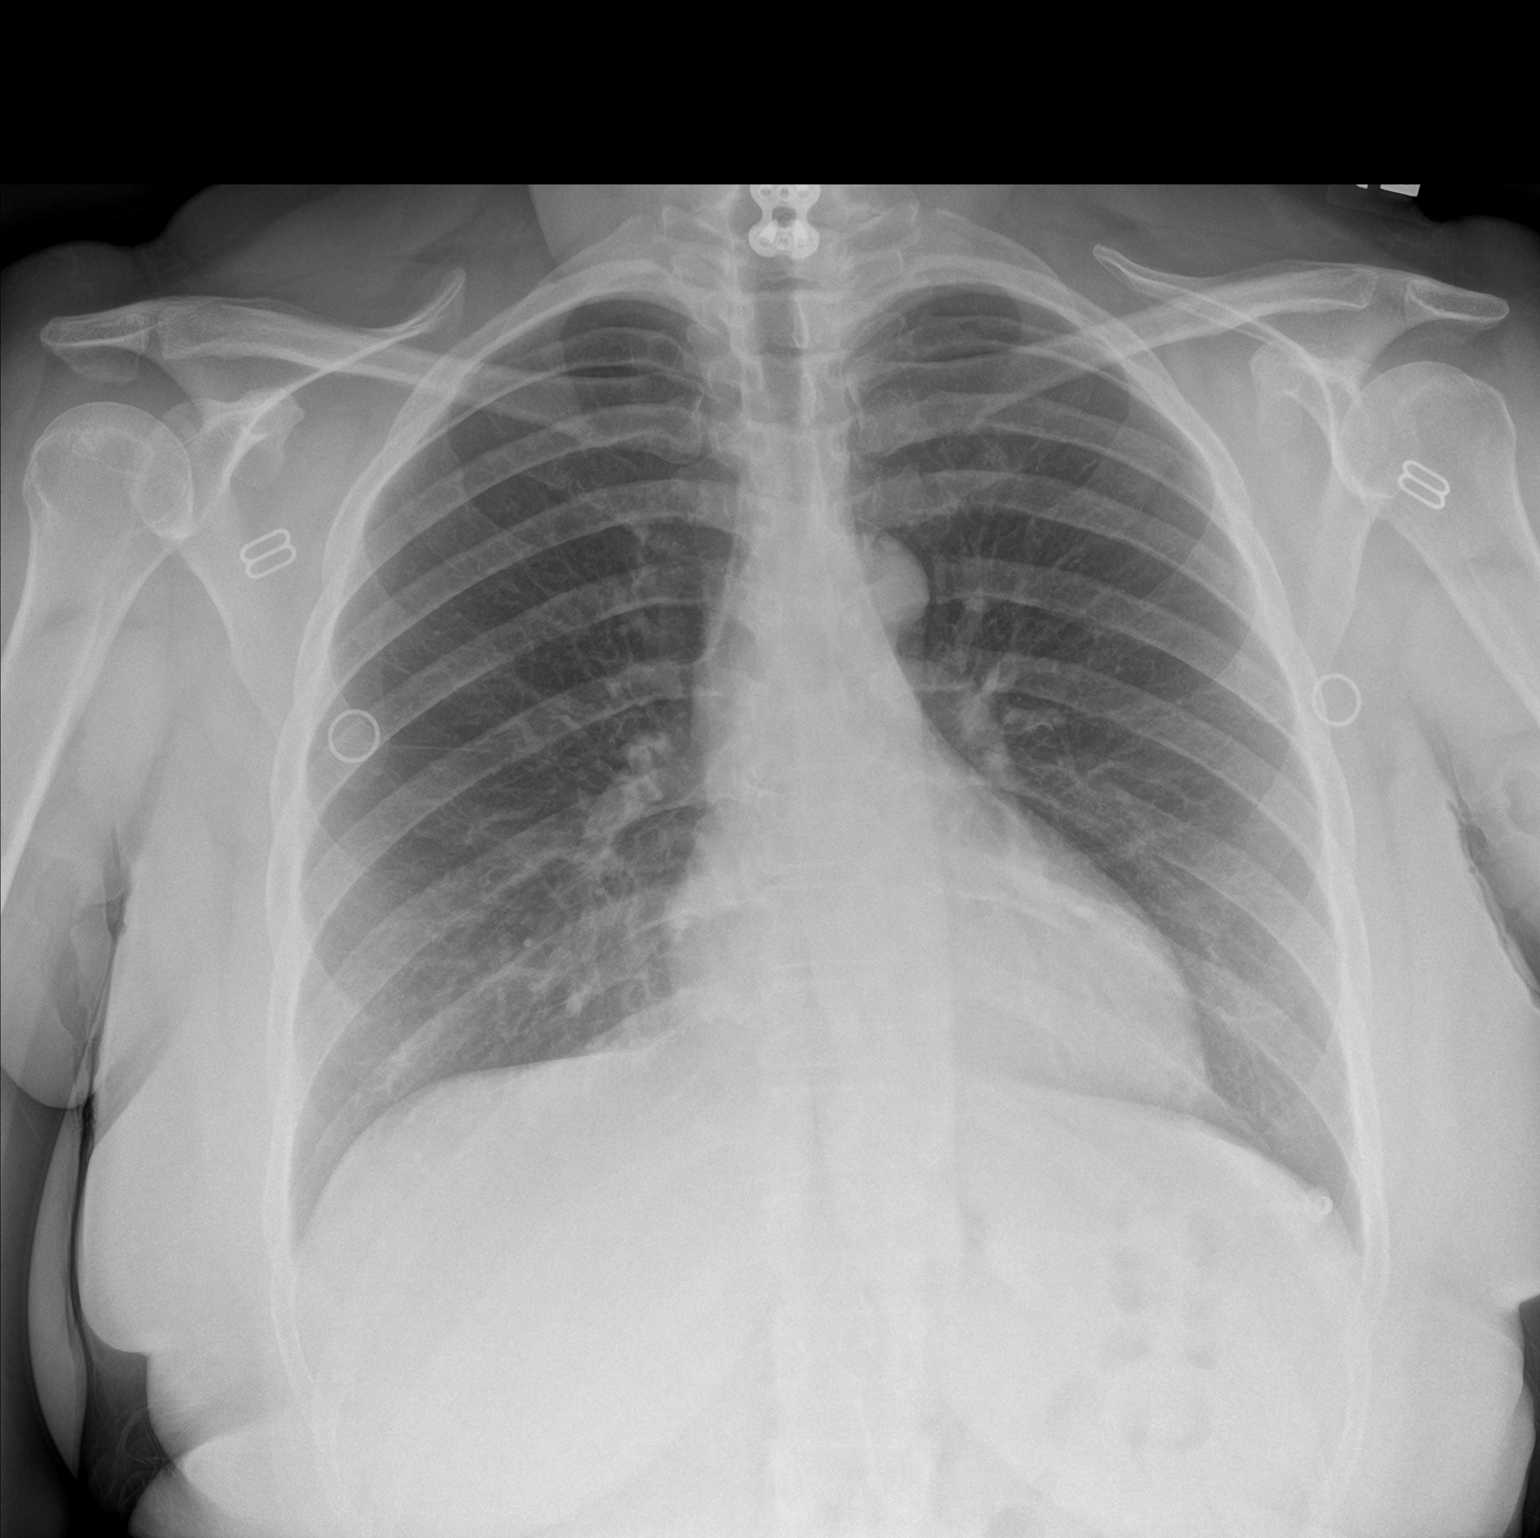

[chest lat]
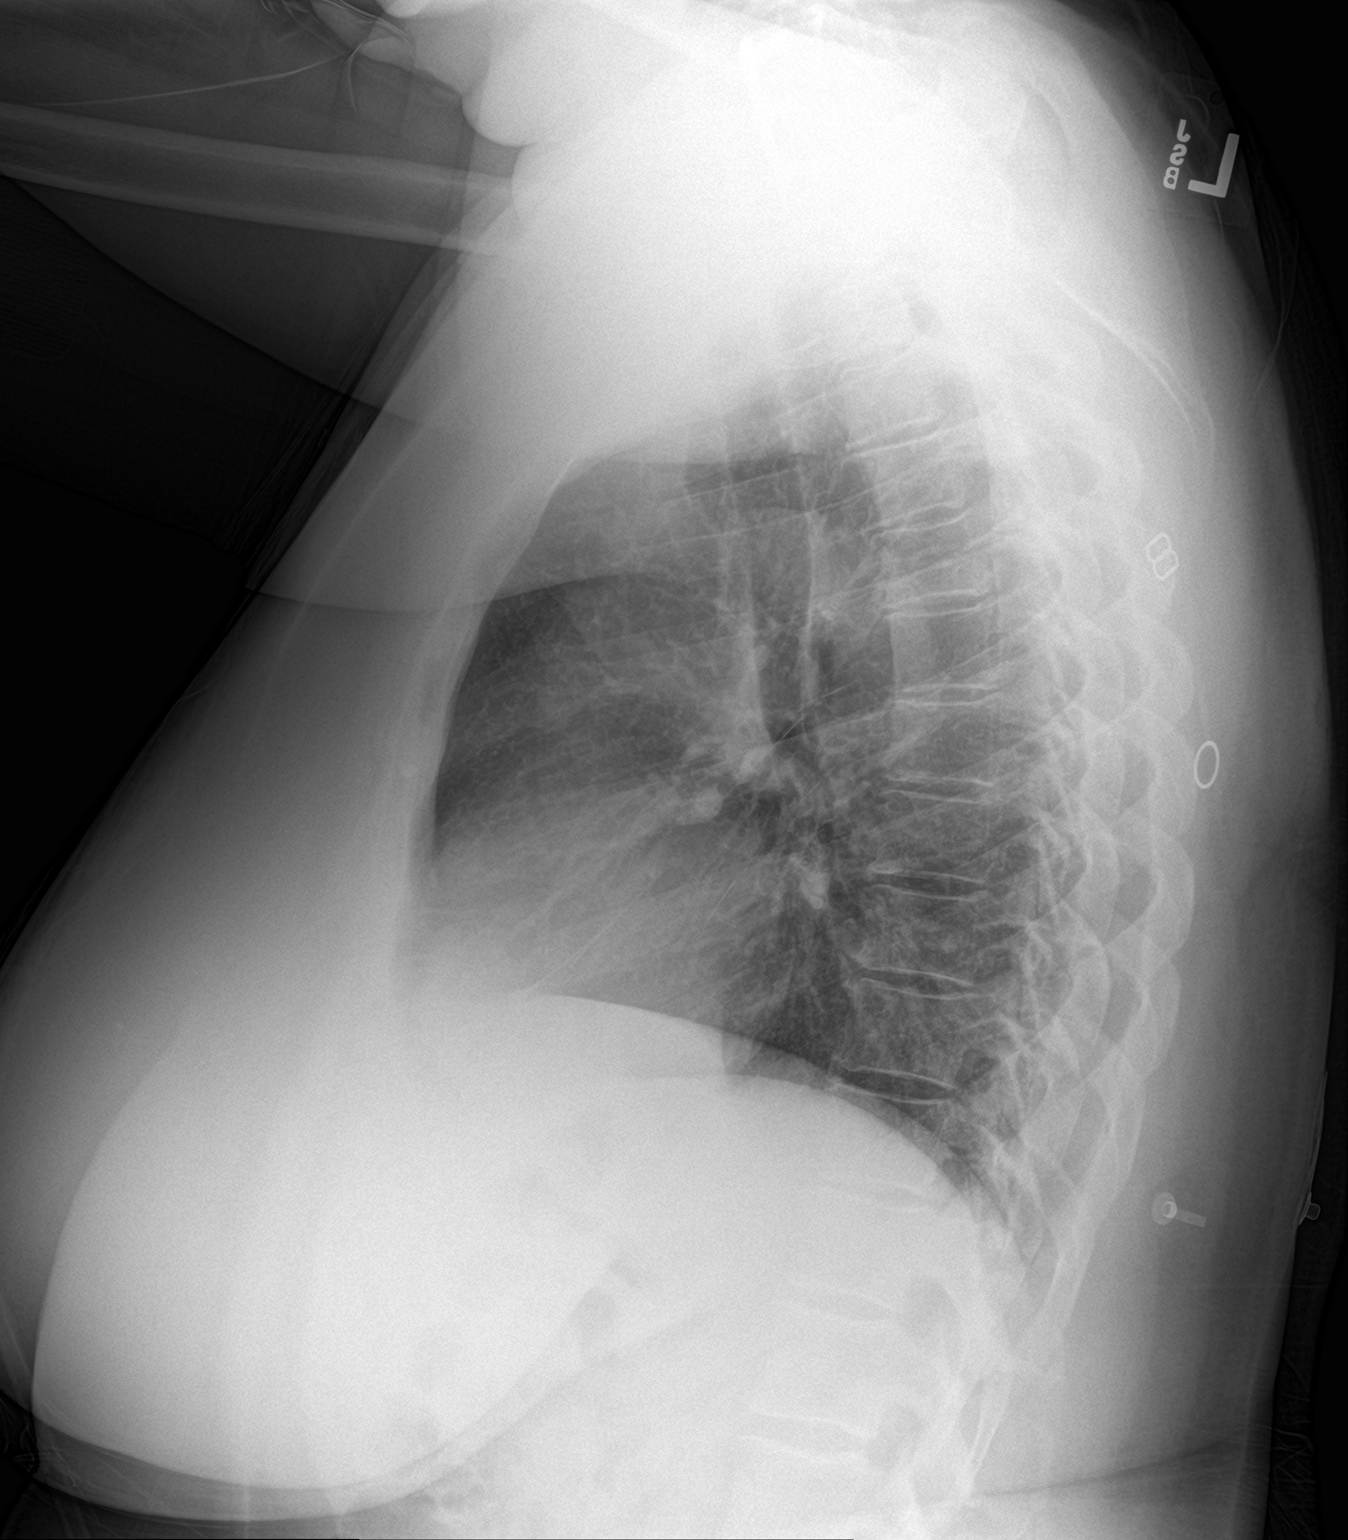

[2 of 2 positions shown; findings below may reference images not displayed]

FINDINGS: The heart size and mediastinal contours are within normal limits.
Both lungs are clear. The visualized skeletal structures are
unremarkable.
IMPRESSION: No active cardiopulmonary disease.

## 2019-06-09 MED ORDER — HYDROCHLOROTHIAZIDE 12.5 MG PO TABS: 12.5000 mg | ORAL_TABLET | Freq: Every day | ORAL | 0 refills | Status: DC

## 2019-06-09 NOTE — ED Provider Notes (Signed)
[facility] EMERGENCY DEPARTMENT Provider Note   CSN: [id number] Arrival date & time: 06/09/19  1256     History Chief Complaint  Patient presents with  . Hypertension    Kara Jimenez is a 59 y.o. female asthma history of arthritis, carpal tunnel, fibromyalgia who presents for evaluation of hypertension.  Patient reports that she had a follow-up appointment with her neurosurgeon today for evaluation of chronic right neck pain.  At her neurosurgeons office, her blood pressure was elevated with systolic blood pressure in the 180s.  She states she does not have any history of hypertension and is not currently on any medications.  She was instructed to go to urgent care for evaluation.  Urgent care advised her to go to the emergency department.  Patient went home first and then called EMS.  On EMS arrival, her blood pressure was 196/106.  Patient had told EMS that she was having "twinges of chest pain."  She was given aspirin and 1 time sublingual nitro.  Patient states that "they thought I had bad chest pain so they gave me that medication."  She states that the pain is intermittent and is more of a dull ache.  She states it is barely noticeable.  She does report that she has history of spasms and states that she gets spasms in her chest.  This does not feel as bad as her spasms.  She did not have any associated nausea, diaphoresis, vomiting, difficulty breathing.  She states that the pain was not improved with sublingual nitro.  It eventually went away by itself after a few seconds.  She states that she has followed up with her primary care doctor as recently as a month ago and did not have any evidence of high blood pressure.  She denies any vision changes, difficulty breathing, abdominal pain, nausea/vomiting, numbness/weakness of arms or legs.  She does not smoke.  She does report a history of marijuana.  She did last use marijuana earlier today.  No cocaine use.  No history of diabetes.  No  personal history of MI.  No family history of heart attack before the age of 1.  The history is provided by the patient.    HPI: A 59 year old patient presents for evaluation of chest pain. Initial onset of pain was approximately 1-3 hours ago. The patient's chest pain is not worse with exertion. The patient's chest pain is middle- or left-sided, is not well-localized, is not described as heaviness/pressure/tightness, is not sharp and does not radiate to the arms/jaw/neck. The patient does not complain of nausea and denies diaphoresis. The patient has no history of stroke, has no history of peripheral artery disease, has not smoked in the past 90 days, denies any history of treated diabetes, has no relevant family history of coronary artery disease (first degree relative at less than age 64), is not hypertensive, has no history of hypercholesterolemia and does not have an elevated BMI (>=30).   Past Medical History:  Diagnosis Date  . Arthritis   . Breast injury   . Carpal tunnel syndrome   . Fibromyalgia   . Sciatica   . Sleep apnea     There are no problems to display for this patient.   Past Surgical History:  Procedure Laterality Date  . BREAST EXCISIONAL BIOPSY Right    benign  . CARPAL TUNNEL RELEASE Bilateral   . CESAREAN SECTION    . NECK SURGERY    . PARTIAL HYSTERECTOMY    .  ROTATOR CUFF REPAIR Left   . TUBAL LIGATION       OB History   No obstetric history on file.     Family History  Problem Relation Age of Onset  . HIV/AIDS Mother   . Hypertension Father     Social History   Tobacco Use  . Smoking status: Never Smoker  . Smokeless tobacco: Never Used  Substance Use Topics  . Alcohol use: Not Currently  . Drug use: Yes    Types: Marijuana    Home Medications Prior to Admission medications   Medication Sig Start Date End Date Taking? Authorizing Provider  acetaminophen (TYLENOL) 500 MG tablet Take 500 mg by mouth every 6 (six) hours as needed.     [provider]  Brinzolamide-Brimonidine (SIMBRINZA) 1-0.2 % SUSP Apply 1 drop to eye daily.  02/27/17   [provider]  hydrochlorothiazide (HYDRODIURIL) 12.5 MG tablet Take 1 tablet (12.5 mg total) by mouth daily. 06/09/19 07/09/19  Volanda Napoleon, PA-C    Allergies    Amitriptyline, Duloxetine, Gabapentin, Pregabalin, Sertraline, Baclofen, and Ibuprofen  Review of Systems   Review of Systems  Constitutional: Negative for fever.  Respiratory: Negative for cough and shortness of breath.   Cardiovascular: Positive for chest pain.  Gastrointestinal: Negative for abdominal pain, nausea and vomiting.  Genitourinary: Negative for dysuria and hematuria.  Neurological: Negative for weakness, numbness and headaches.  All other systems reviewed and are negative.   Physical Exam Updated Vital Signs BP (!) 139/92   Pulse 86   Temp (!) 97.5 F (36.4 C) (Temporal)   Resp 16   Ht 5\' 6"  (1.676 m)   Wt 104.3 kg   LMP 04/15/2019   SpO2 98%   BMI 37.12 kg/m   Physical Exam Vitals and nursing note reviewed.  Constitutional:      Appearance: Normal appearance. She is well-developed.  HENT:     Head: Normocephalic and atraumatic.  Eyes:     General: Lids are normal.     Conjunctiva/sclera: Conjunctivae normal.     Pupils: Pupils are equal, round, and reactive to light.     Comments: PERRL. EOMs intact. No nystagmus. No neglect.   Cardiovascular:     Rate and Rhythm: Normal rate and regular rhythm.     Pulses: Normal pulses.          Radial pulses are 2+ on the right side and 2+ on the left side.       Dorsalis pedis pulses are 2+ on the right side and 2+ on the left side.     Heart sounds: Normal heart sounds. No murmur. No friction rub. No gallop.   Pulmonary:     Effort: Pulmonary effort is normal.     Breath sounds: Normal breath sounds.     Comments: Lungs clear to auscultation bilaterally.  Symmetric chest rise.  No wheezing, rales, rhonchi. Abdominal:      Palpations: Abdomen is soft. Abdomen is not rigid.     Tenderness: There is no abdominal tenderness. There is no guarding.     Comments: Abdomen is soft, non-distended, non-tender. No rigidity, No guarding. No peritoneal signs.  Musculoskeletal:        General: Normal range of motion.     Cervical back: Full passive range of motion without pain.  Skin:    General: Skin is warm and dry.     Capillary Refill: Capillary refill takes less than 2 seconds.  Neurological:     Mental  Status: She is alert and oriented to person, place, and time.     Comments: Cranial nerves III-XII intact Follows commands, Moves all extremities  5/5 strength to BUE and BLE  Sensation intact throughout all major nerve distributions Normal finger to nose. No slurred speech. No facial droop.   Psychiatric:        Speech: Speech normal.     ED Results / Procedures / Treatments   Labs (all labs ordered are listed, but only abnormal results are displayed) Labs Reviewed  BASIC METABOLIC PANEL - Abnormal; Notable for the following components:      Result Value   Glucose, Bld 109 (*)    BUN 24 (*)    All other components within normal limits  CBC WITH DIFFERENTIAL/PLATELET  URINALYSIS, ROUTINE W REFLEX MICROSCOPIC  TROPONIN I (HIGH SENSITIVITY)  TROPONIN I (HIGH SENSITIVITY)    EKG EKG Interpretation  Date/Time:  Tuesday Jun 09 2019 13:26:07 EDT Ventricular Rate:  78 PR Interval:  202 QRS Duration: 104 QT Interval:  382 QTC Calculation: 435 R Axis:   73 Text Interpretation: Normal sinus rhythm Normal ECG No significant change since last tracing Confirmed by Dorie Rank [phone]) on 06/09/2019 9:51:36 PM   Radiology DG Chest 2 View  Result Date: 06/09/2019 CLINICAL DATA:  Chest pain EXAM: CHEST - 2 VIEW COMPARISON:  None. FINDINGS: The heart size and mediastinal contours are within normal limits. Both lungs are clear. The visualized skeletal structures are unremarkable. IMPRESSION: No active  cardiopulmonary disease. Electronically Signed   By: Ulyses Jarred M.D.   On: 06/09/2019 19:33    Procedures Procedures (including critical care time)  Medications Ordered in ED Medications - No data to display  ED Course  I have reviewed the triage vital signs and the nursing notes.  Pertinent labs & imaging results that were available during my care of the patient were reviewed by me and considered in my medical decision making (see chart for details).    MDM Rules/Calculators/A&P HEAR Score: 1                    59 year old female who presents for evaluation of high blood pressure.  She reports that she was seen by a neurosurgeon for evaluation of neck pain earlier today and was told her blood pressure was high.  She was instructed to go to the emergency department.  She went home and called EMS.  On EMS arrival, her blood pressure was elevated.  She was reporting some minor twinges/dull chest pain and they gave her nitro which she states did not help with her pain.  She states that this is intermittent and not affected by exertion or deep inspiration.  She also reports she has history of spasms.  On initial arrival, she is afebrile, nontoxic-appearing.  Her initial blood pressure was 166/98.  She has fluctuated between 140s-160s.  We will plan to monitor.  Plan to check labs, EKG, chest x-ray.  BMP is normal with normal BUN and creatinine.  CBC shows no leukocytosis or anemia.  Troponin is negative.  Chest x-ray negative for any acute abnormalities.  Delta troponin is negative.  At this time, patient is resting comfortably.  Her blood pressure has improved though she still slightly hypertensive.  At this time, she has no evidence of neurological deficits.  Her delta troponin and EKG are reassuring.  Her story is not concerning for ACS etiology.  Question if this is new onset hypertension.  At this time,  do not see any infectious etiology that would be concerning for symptoms.  Exam not  concerning for hypertensive emergency.  I discussed with patient regarding her blood pressure.  We will plan to start her on a very small dose of blood pressure medication.  She has follow-up with her primary care doctor tomorrow.  I discussed with her regarding monitoring her blood pressure and following up with her doctor. Discussed patient with Dr. Tomi Bamberger who is agreeable. Patient had ample opportunity for questions and discussion. All patient's questions were answered with full understanding. Strict return precautions discussed. Patient expresses understanding and agreement to plan.   Portions of this note were generated with [employer]. Dictation errors may occur despite best attempts at proofreading.   Final Clinical Impression(s) / ED Diagnoses Final diagnoses:  Hypertension, unspecified type  Nonspecific chest pain    Rx / DC Orders ED Discharge Orders         Ordered    hydrochlorothiazide (HYDRODIURIL) 12.5 MG tablet  Daily     06/09/19 2218           Desma Mcgregor 06/10/19 0049    Dorie Rank, MD 06/10/19 2021

## 2019-06-09 NOTE — ED Triage Notes (Signed)
Per EMS- Pt c/o hypertension with dizziness. Pt sent by neurologist. Initial BP 196/106.   EMS gave 324 aspirin, 1 sublingual nitro. No relief.   In triage, complaining of "minor chest pain" per pt. Pointing to left chest area. Denies radiation of pain.

## 2019-06-09 NOTE — Discharge Instructions (Addendum)
As we discussed, your work-up today was reassuring.  Your blood pressure is still slightly elevated but has improved.  We will plan to start you on a very low dose of blood pressure medication. Follow-up with your primary care doctor as previously scheduled tomorrow.  As we discussed, if you feel lightheaded or feel you are going to pass out, stop taking the blood pressure medication. Return to the emergency department for any chest pain, difficulty breathing, numbness/weakness of your arms or legs, difficulty walking, vision changes or any other worsening concerning symptoms.

## 2019-06-10 ENCOUNTER — Other Ambulatory Visit: Payer: Self-pay | Admitting: Nurse Practitioner

## 2019-06-10 DIAGNOSIS — Z981 Arthrodesis status: Secondary | ICD-10-CM

## 2019-06-25 ENCOUNTER — Ambulatory Visit
Admission: RE | Admit: 2019-06-25 | Discharge: 2019-06-25 | Disposition: A | Discharge status: Home / Self Care | Payer: Medicare Other | Source: Ambulatory Visit | Attending: Nurse Practitioner | Admitting: Nurse Practitioner

## 2019-06-25 ENCOUNTER — Other Ambulatory Visit: Payer: Self-pay

## 2019-06-25 DIAGNOSIS — Z981 Arthrodesis status: Secondary | ICD-10-CM | POA: Diagnosis not present

## 2019-06-25 IMAGING — MR MR CERVICAL SPINE W/O CM
5 series · 40 of 48 positions shown · non-contrast
Comparison: Radiography [DATE]

CLINICAL DATA: Right-sided neck pain radiating to the right arm and
hand

EXAM:
MRI CERVICAL SPINE WITHOUT CONTRAST
TECHNIQUE: Multiplanar, multisequence MR imaging of the cervical spine was
performed. No intravenous contrast was administered.

[Series 5: T2 · sagittal · 3.0mm · 0.62mm/px · 6 of 15 slices shown (1 of 2)]
[im 1/15]
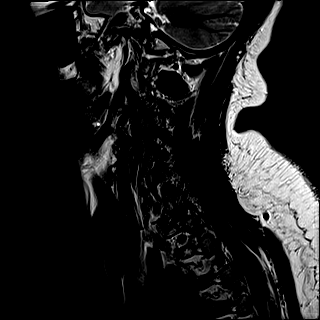
[im 3/15]
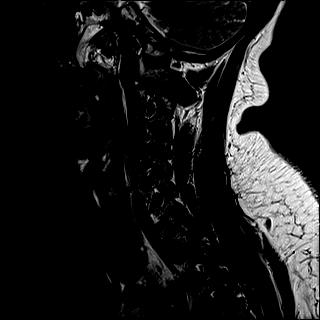
[im 6/15]
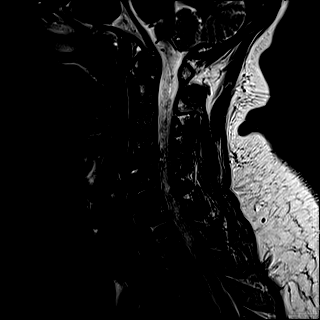
[im 9/15]
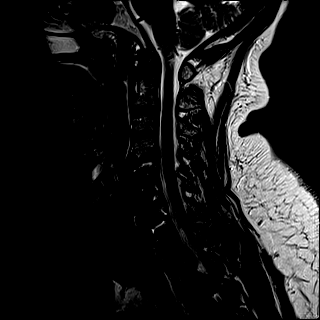
[im 12/15]
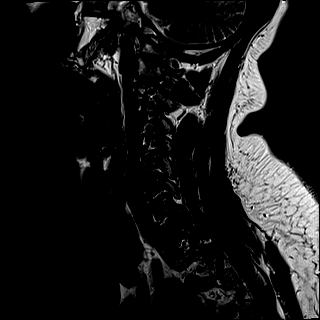
[im 15/15]
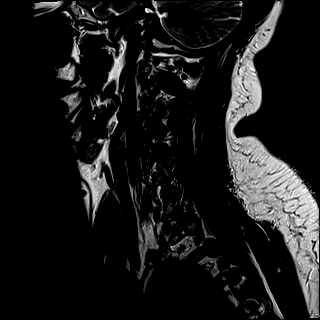

[Series 6: FLAIR · sagittal · 3.0mm · 0.78mm/px · 7 of 15 slices shown]
[im 1/15]
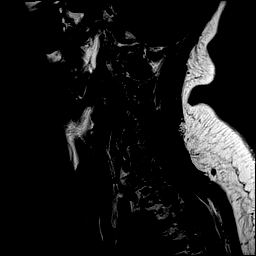
[im 3/15]
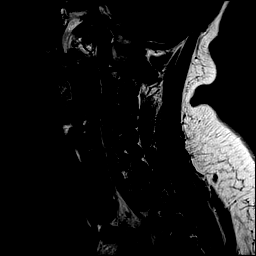
[im 5/15]
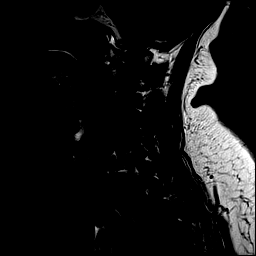
[im 8/15]
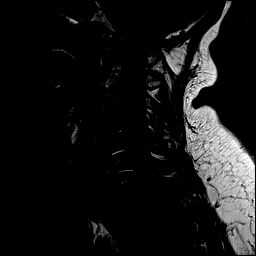
[im 10/15]
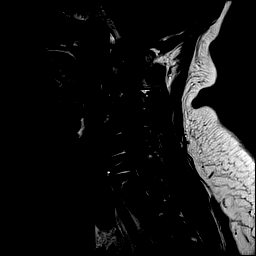
[im 12/15]
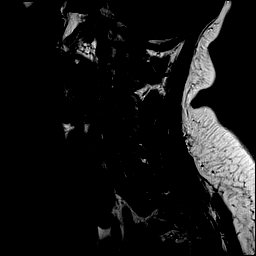
[im 15/15]
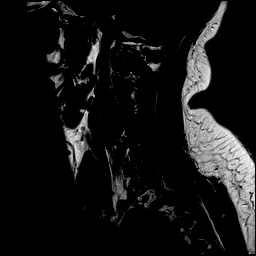

[Series 7: STIR · sagittal · 3.0mm · 0.62mm/px · 7 of 15 slices shown]
[im 1/15]
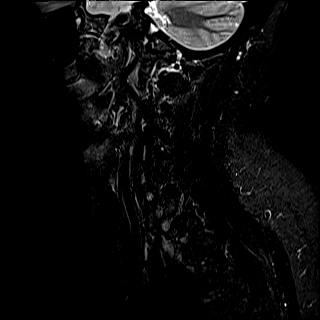
[im 3/15]
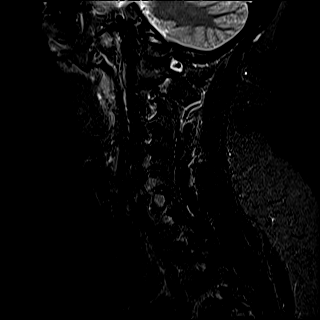
[im 5/15]
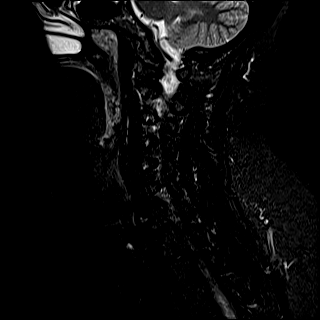
[im 8/15]
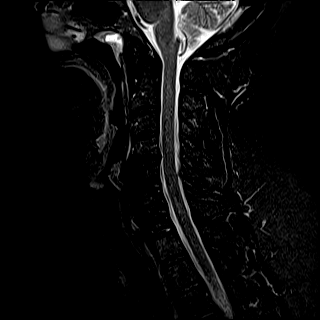
[im 10/15]
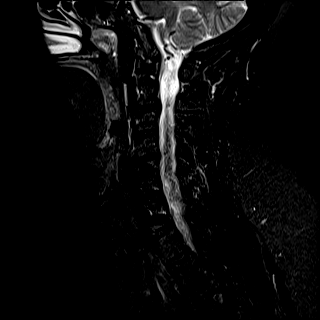
[im 12/15]
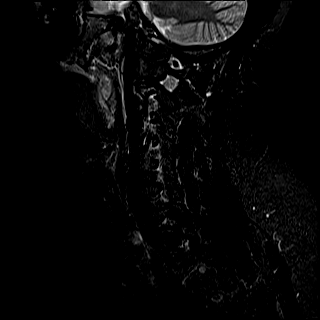
[im 15/15]
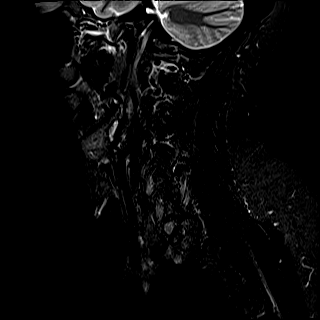

[Series 8: T2 · axial · 3.0mm · 0.70mm/px · z∈[-103,-7]mm · 12 of 29 slices shown (2 of 2)]
[im 1/29]
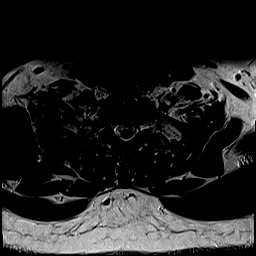
[im 3/29]
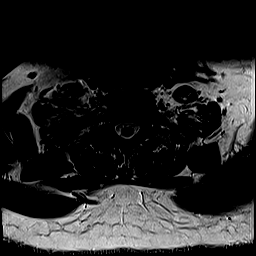
[im 5/29]
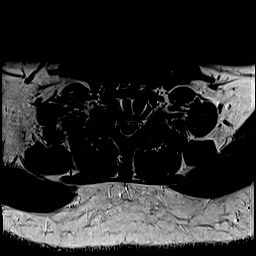
[im 7/29]
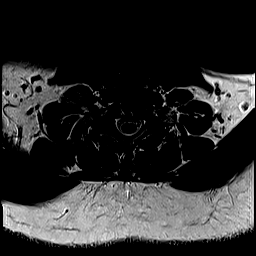
[im 9/29]
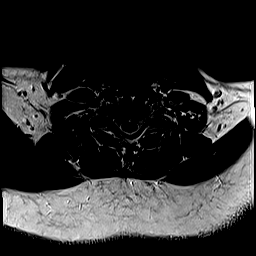
[im 11/29]
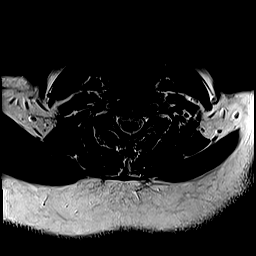
[im 13/29]
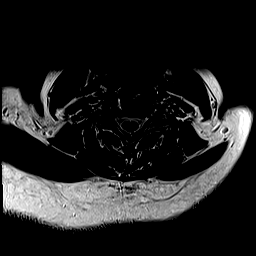
[im 16/29]
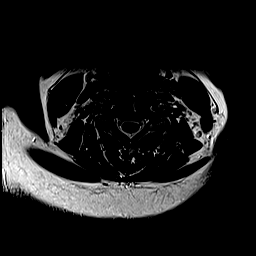
[im 18/29]
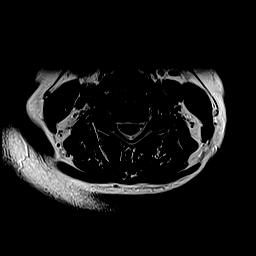
[im 20/29]
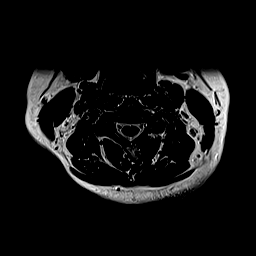
[im 24/29]
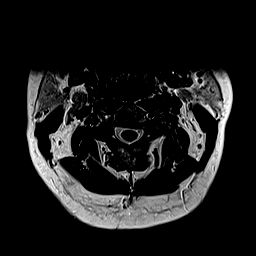
[im 29/29]
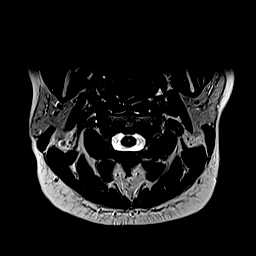

[Series 9: ax mpgr · axial · 3.0mm · 0.35mm/px · z∈[-103,-7]mm · 8 of 29 slices shown]
[im 1/29]
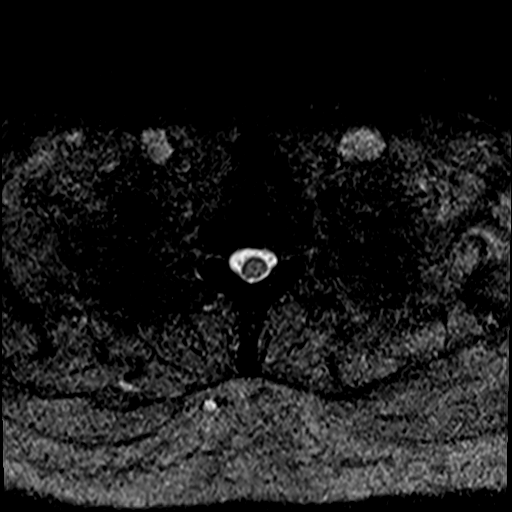
[im 5/29]
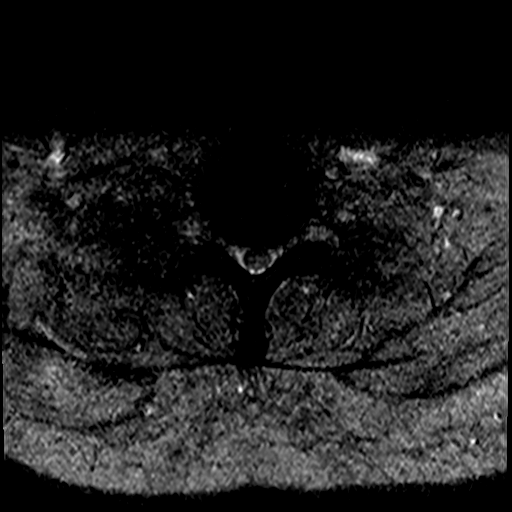
[im 9/29]
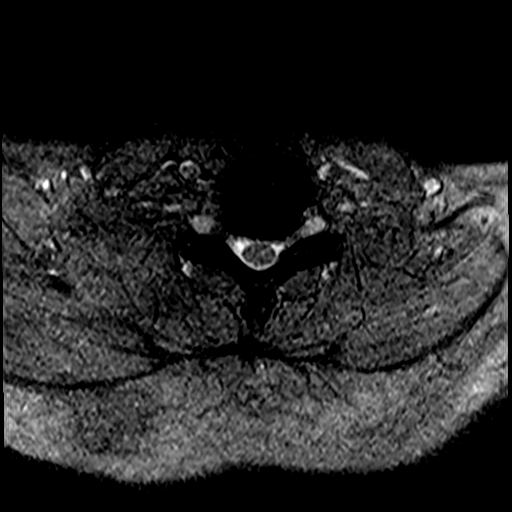
[im 13/29]
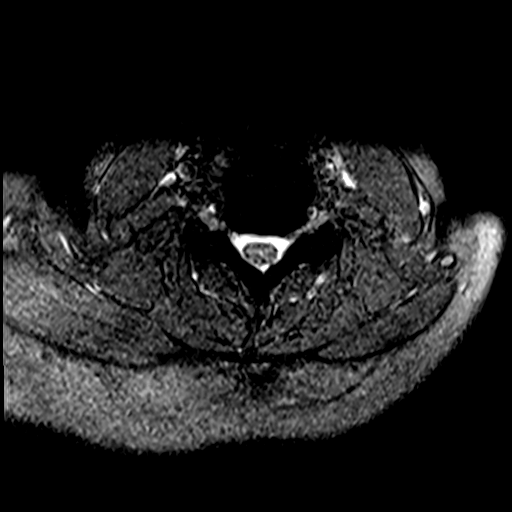
[im 16/29]
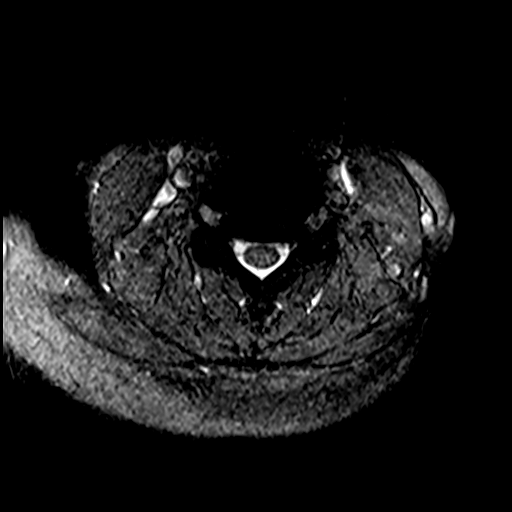
[im 20/29]
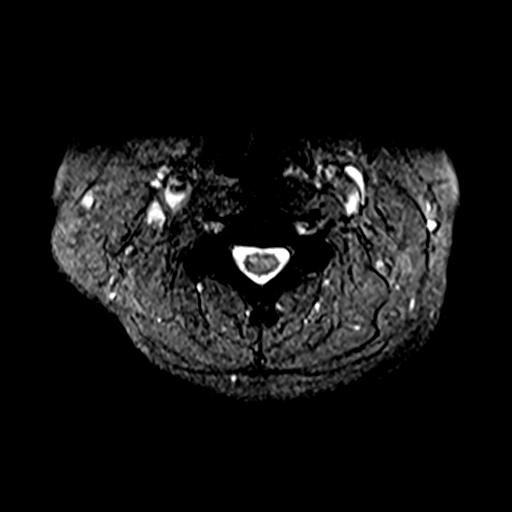
[im 24/29]
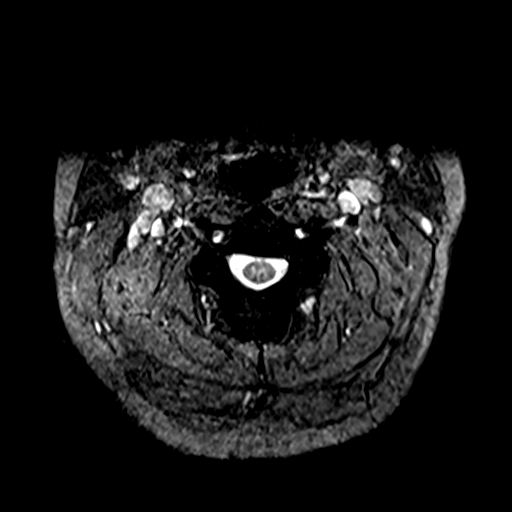
[im 29/29]
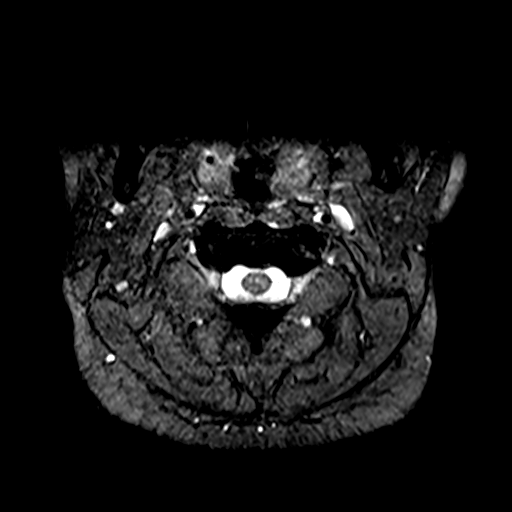

[40 of 48 positions shown; findings below may reference images not displayed]

FINDINGS: Alignment: Normal

Vertebrae: Previous ACDF C5 through C7 which appears solid.

Cord: No cord compression or primary cord lesion.

Posterior Fossa, vertebral arteries, paraspinal tissues: Negative

Disc levels:

No abnormality at the foramen magnum or C1-2.

C2-3: Left-sided uncovertebral prominence. No canal stenosis. Mild
left foraminal narrowing.

C3-4: Normal interspace.

C4-5: Degenerative spondylosis with endplate osteophytes and
uncovertebral hypertrophy right more than left. No canal stenosis.
Foraminal narrowing on the right that could affect the C5 nerve.

C5 through C7: Previous ACDF appears solid with wide patency of the
canal and foramina.

C7-T1: No disc pathology.  Mild facet degeneration.  No stenosis.
IMPRESSION: Previous ACDF C5 through C7 has a good appearance with wide patency
of the canal and foramina.

C4-5: Degenerative spondylosis right more than left. Right foraminal
narrowing that could possibly affect the right C5 nerve.

C2-3: Left uncovertebral prominence with mild left foraminal
narrowing.

C7-T1: Mild facet degeneration but without slippage or encroachment
upon the canal or foramina.

## 2019-11-05 ENCOUNTER — Inpatient Hospital Stay (HOSPITAL_COMMUNITY)
Admission: EM | Admit: 2019-11-05 | Discharge: 2019-11-07 | DRG: 343 | Disposition: A | Discharge status: Home / Self Care | Payer: Medicare Other | Attending: Surgery | Admitting: Surgery

## 2019-11-05 ENCOUNTER — Observation Stay (HOSPITAL_COMMUNITY): Payer: Medicare Other

## 2019-11-05 ENCOUNTER — Encounter (HOSPITAL_COMMUNITY)
Admission: EM | Disposition: A | Discharge status: Home / Self Care | Payer: Self-pay | Source: Home / Self Care | Attending: General Surgery

## 2019-11-05 ENCOUNTER — Emergency Department (HOSPITAL_COMMUNITY): Payer: Medicare Other | Admitting: Anesthesiology

## 2019-11-05 ENCOUNTER — Emergency Department (HOSPITAL_COMMUNITY): Payer: Medicare Other

## 2019-11-05 ENCOUNTER — Encounter (HOSPITAL_COMMUNITY): Payer: Self-pay

## 2019-11-05 DIAGNOSIS — K3580 Unspecified acute appendicitis: Principal | ICD-10-CM | POA: Diagnosis present

## 2019-11-05 DIAGNOSIS — Z6836 Body mass index (BMI) 36.0-36.9, adult: Secondary | ICD-10-CM

## 2019-11-05 DIAGNOSIS — R001 Bradycardia, unspecified: Secondary | ICD-10-CM

## 2019-11-05 DIAGNOSIS — M797 Fibromyalgia: Secondary | ICD-10-CM | POA: Diagnosis present

## 2019-11-05 DIAGNOSIS — E66812 Obesity, class 2: Secondary | ICD-10-CM

## 2019-11-05 DIAGNOSIS — Z888 Allergy status to other drugs, medicaments and biological substances status: Secondary | ICD-10-CM

## 2019-11-05 DIAGNOSIS — R1031 Right lower quadrant pain: Secondary | ICD-10-CM | POA: Diagnosis not present

## 2019-11-05 DIAGNOSIS — E669 Obesity, unspecified: Secondary | ICD-10-CM

## 2019-11-05 DIAGNOSIS — K869 Disease of pancreas, unspecified: Secondary | ICD-10-CM | POA: Diagnosis present

## 2019-11-05 DIAGNOSIS — G4733 Obstructive sleep apnea (adult) (pediatric): Secondary | ICD-10-CM | POA: Diagnosis present

## 2019-11-05 DIAGNOSIS — Z8249 Family history of ischemic heart disease and other diseases of the circulatory system: Secondary | ICD-10-CM

## 2019-11-05 DIAGNOSIS — K660 Peritoneal adhesions (postprocedural) (postinfection): Secondary | ICD-10-CM | POA: Diagnosis present

## 2019-11-05 DIAGNOSIS — Z90711 Acquired absence of uterus with remaining cervical stump: Secondary | ICD-10-CM

## 2019-11-05 DIAGNOSIS — F1290 Cannabis use, unspecified, uncomplicated: Secondary | ICD-10-CM | POA: Diagnosis present

## 2019-11-05 DIAGNOSIS — I10 Essential (primary) hypertension: Secondary | ICD-10-CM | POA: Diagnosis present

## 2019-11-05 DIAGNOSIS — Z886 Allergy status to analgesic agent status: Secondary | ICD-10-CM

## 2019-11-05 DIAGNOSIS — Z79899 Other long term (current) drug therapy: Secondary | ICD-10-CM

## 2019-11-05 DIAGNOSIS — Z20822 Contact with and (suspected) exposure to covid-19: Secondary | ICD-10-CM | POA: Diagnosis present

## 2019-11-05 DIAGNOSIS — M1990 Unspecified osteoarthritis, unspecified site: Secondary | ICD-10-CM | POA: Diagnosis present

## 2019-11-05 DIAGNOSIS — I440 Atrioventricular block, first degree: Secondary | ICD-10-CM | POA: Diagnosis present

## 2019-11-05 HISTORY — PX: LAPAROSCOPIC APPENDECTOMY: SHX408

## 2019-11-05 LAB — CBC WITH DIFFERENTIAL/PLATELET
Abs Immature Granulocytes: 0.02 10*3/uL (ref 0.00–0.07)
Abs Immature Granulocytes: 0.03 10*3/uL (ref 0.00–0.07)
Basophils Absolute: 0 10*3/uL (ref 0.0–0.1)
Basophils Absolute: 0 10*3/uL (ref 0.0–0.1)
Basophils Relative: 1 %
Basophils Relative: 0 %
Eosinophils Absolute: 0 10*3/uL (ref 0.0–0.5)
Eosinophils Absolute: 0 10*3/uL (ref 0.0–0.5)
Eosinophils Relative: 1 %
Eosinophils Relative: 0 %
HCT: 42.5 % (ref 36.0–46.0)
HCT: 42.2 % (ref 36.0–46.0)
Hemoglobin: 14.2 g/dL (ref 12.0–15.0)
Hemoglobin: 13.5 g/dL (ref 12.0–15.0)
Immature Granulocytes: 0 %
Immature Granulocytes: 0 %
Lymphocytes Relative: 32 %
Lymphocytes Relative: 16 %
Lymphs Abs: 2.3 10*3/uL (ref 0.7–4.0)
Lymphs Abs: 1.9 10*3/uL (ref 0.7–4.0)
MCH: 31.9 pg (ref 26.0–34.0)
MCH: 32.1 pg (ref 26.0–34.0)
MCHC: 33.4 g/dL (ref 30.0–36.0)
MCHC: 32 g/dL (ref 30.0–36.0)
MCV: 95.5 fL (ref 80.0–100.0)
MCV: 100.2 fL — ABNORMAL HIGH (ref 80.0–100.0)
Monocytes Absolute: 0.3 10*3/uL (ref 0.1–1.0)
Monocytes Absolute: 0.4 10*3/uL (ref 0.1–1.0)
Monocytes Relative: 5 %
Monocytes Relative: 4 %
Neutro Abs: 4.6 10*3/uL (ref 1.7–7.7)
Neutro Abs: 9.2 10*3/uL — ABNORMAL HIGH (ref 1.7–7.7)
Neutrophils Relative %: 61 %
Neutrophils Relative %: 80 %
Platelets: 217 10*3/uL (ref 150–400)
Platelets: 191 10*3/uL (ref 150–400)
RBC: 4.45 MIL/uL (ref 3.87–5.11)
RBC: 4.21 MIL/uL (ref 3.87–5.11)
RDW: 12.7 % (ref 11.5–15.5)
RDW: 12.8 % (ref 11.5–15.5)
WBC: 7.4 10*3/uL (ref 4.0–10.5)
WBC: 11.6 10*3/uL — ABNORMAL HIGH (ref 4.0–10.5)
nRBC: 0 % (ref 0.0–0.2)
nRBC: 0 % (ref 0.0–0.2)

## 2019-11-05 LAB — URINALYSIS, ROUTINE W REFLEX MICROSCOPIC
Bacteria, UA: NONE SEEN
Bilirubin Urine: NEGATIVE
Glucose, UA: NEGATIVE mg/dL
Ketones, ur: NEGATIVE mg/dL
Leukocytes,Ua: NEGATIVE
Nitrite: NEGATIVE
Protein, ur: NEGATIVE mg/dL
Specific Gravity, Urine: 1.005 (ref 1.005–1.030)
pH: 5 (ref 5.0–8.0)

## 2019-11-05 LAB — RESPIRATORY PANEL BY RT PCR (FLU A&B, COVID)
Influenza A by PCR: NEGATIVE
Influenza B by PCR: NEGATIVE
SARS Coronavirus 2 by RT PCR: NEGATIVE

## 2019-11-05 LAB — BASIC METABOLIC PANEL
Anion gap: 9 (ref 5–15)
BUN: 15 mg/dL (ref 6–20)
CO2: 21 mmol/L — ABNORMAL LOW (ref 22–32)
Calcium: 8.4 mg/dL — ABNORMAL LOW (ref 8.9–10.3)
Chloride: 108 mmol/L (ref 98–111)
Creatinine, Ser: 0.8 mg/dL (ref 0.44–1.00)
GFR, Estimated: >60 mL/min (ref >60)
Glucose, Bld: 129 mg/dL — ABNORMAL HIGH (ref 70–99)
Potassium: 3.4 mmol/L — ABNORMAL LOW (ref 3.5–5.1)
Sodium: 138 mmol/L (ref 135–145)

## 2019-11-05 LAB — COMPREHENSIVE METABOLIC PANEL
ALT: 16 U/L (ref 0–44)
AST: 18 U/L (ref 15–41)
Albumin: 4.2 g/dL (ref 3.5–5.0)
Alkaline Phosphatase: 61 U/L (ref 38–126)
Anion gap: 10 (ref 5–15)
BUN: 18 mg/dL (ref 6–20)
CO2: 26 mmol/L (ref 22–32)
Calcium: 9.3 mg/dL (ref 8.9–10.3)
Chloride: 105 mmol/L (ref 98–111)
Creatinine, Ser: 0.78 mg/dL (ref 0.44–1.00)
GFR, Estimated: >60 mL/min (ref >60)
Glucose, Bld: 95 mg/dL (ref 70–99)
Potassium: 3.8 mmol/L (ref 3.5–5.1)
Sodium: 141 mmol/L (ref 135–145)
Total Bilirubin: 0.7 mg/dL (ref 0.3–1.2)
Total Protein: 7.9 g/dL (ref 6.5–8.1)

## 2019-11-05 LAB — MAGNESIUM: Magnesium: 1.9 mg/dL (ref 1.7–2.4)

## 2019-11-05 LAB — TROPONIN I (HIGH SENSITIVITY)
Troponin I (High Sensitivity): 3 ng/L (ref <18)
Troponin I (High Sensitivity): 4 ng/L (ref <18)

## 2019-11-05 LAB — LIPASE, BLOOD: Lipase: 19 U/L (ref 11–51)

## 2019-11-05 LAB — TSH: TSH: 3.002 u[IU]/mL (ref 0.350–4.500)

## 2019-11-05 IMAGING — CT CT ABD-PELV W/ CM
2 of 5 series · 16 of 46 positions shown, 18 images · IV contrast (Omnipaque or Isovue)
Comparison: None.

CLINICAL DATA: Right lower quadrant abdominal pain

EXAM:
CT ABDOMEN AND PELVIS WITH CONTRAST
TECHNIQUE: Multidetector CT imaging of the abdomen and pelvis was performed
using the standard protocol following bolus administration of
intravenous contrast.
CONTRAST:  100mL OMNIPAQUE IOHEXOL 300 MG/ML  SOLN

[Series 2: axial st · axial · 0.94mm/px · z∈[-704,-294]mm · 13 of 92 slices shown, 15 images]
[im 5/92  soft-tissue]
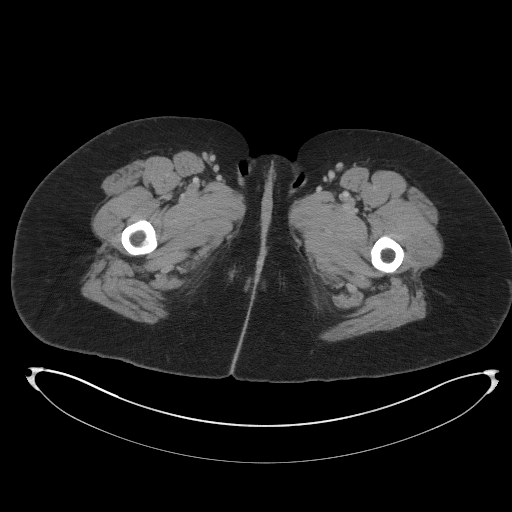
[im 5/92  bone]
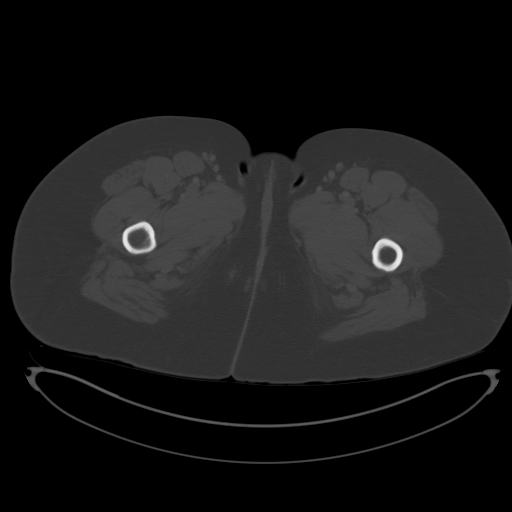
[im 14/92  soft-tissue]
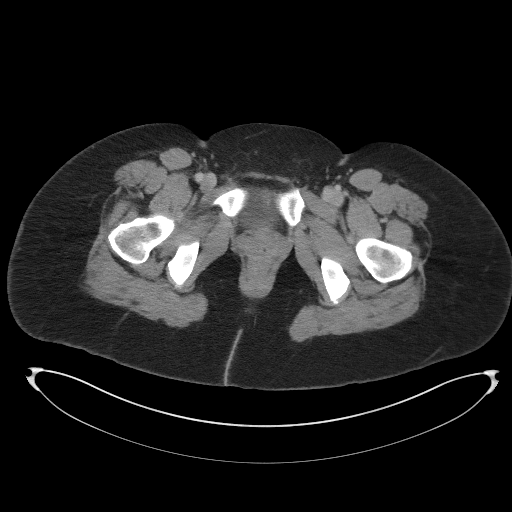
[im 19/92  soft-tissue]
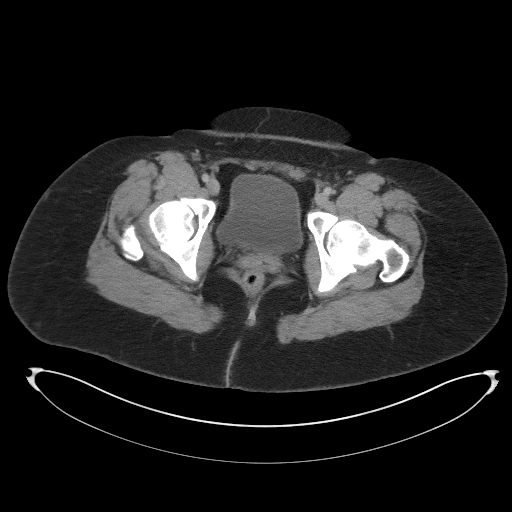
[im 28/92  soft-tissue]
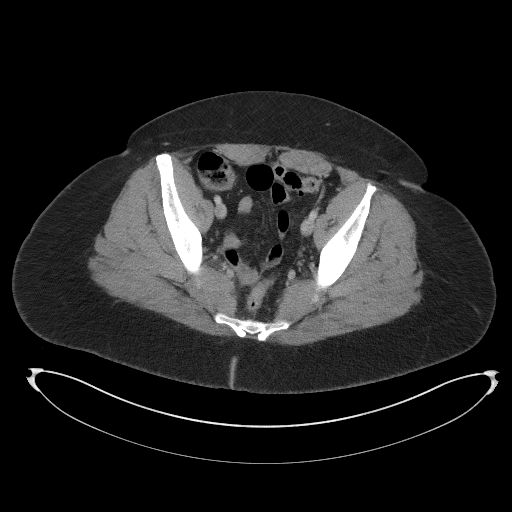
[im 32/92  soft-tissue]
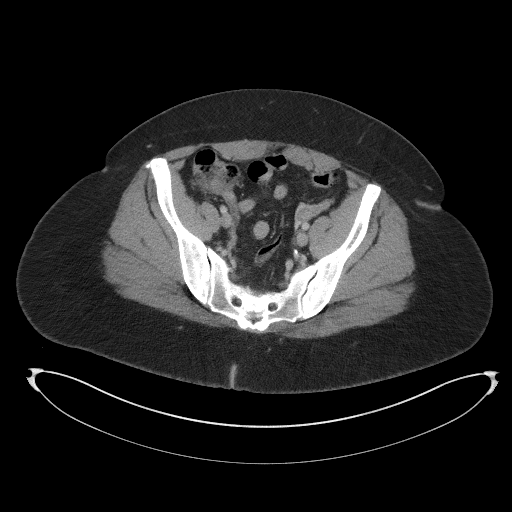
[im 41/92  soft-tissue]
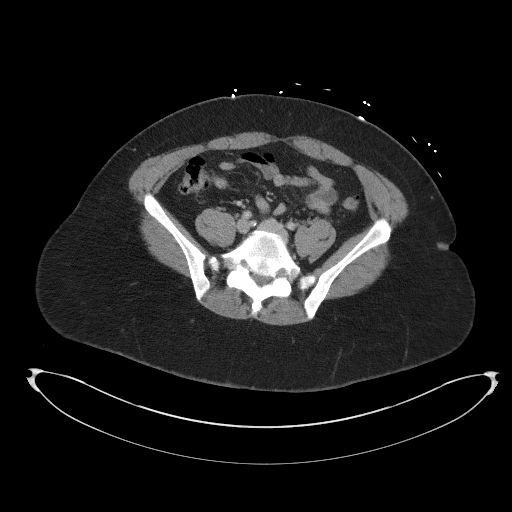
[im 46/92  soft-tissue]
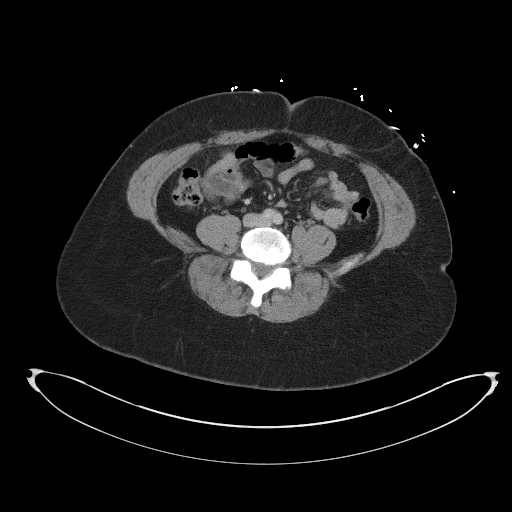
[im 51/92  soft-tissue]
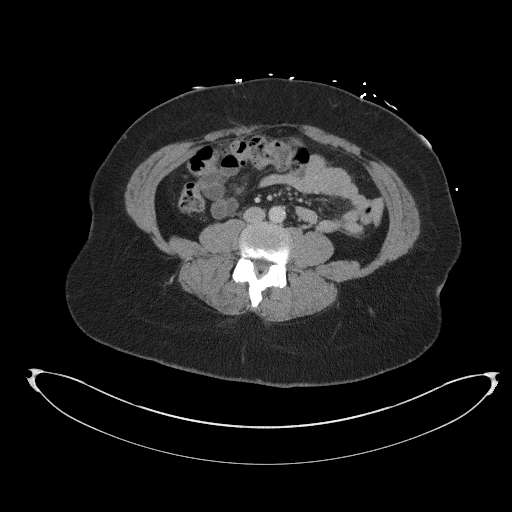
[im 60/92  soft-tissue]
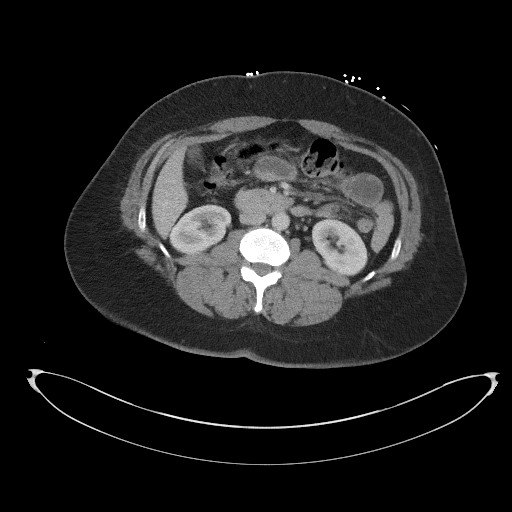
[im 60/92  bone]
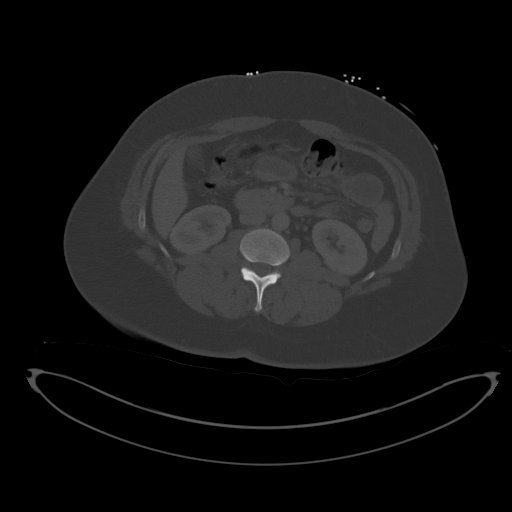
[im 64/92  soft-tissue]
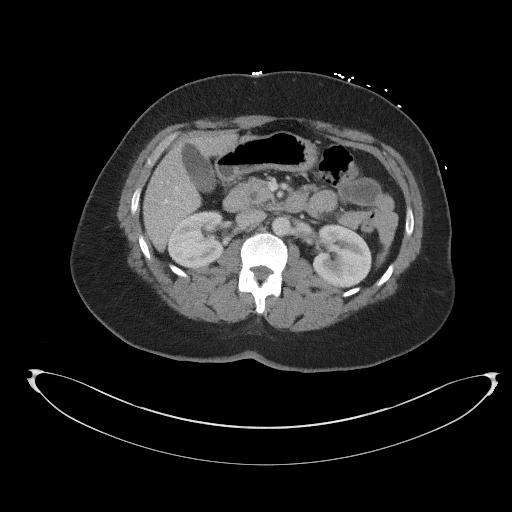
[im 73/92  soft-tissue]
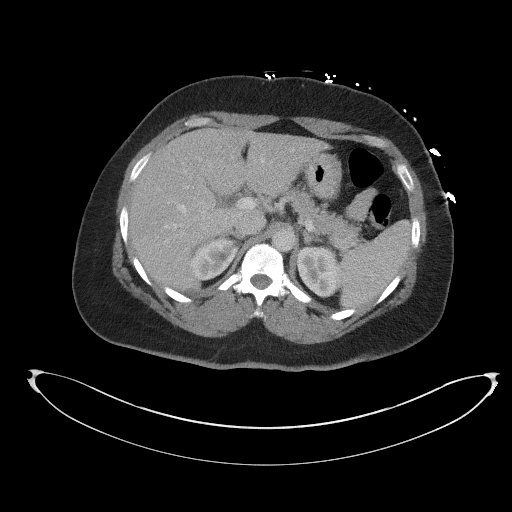
[im 78/92  soft-tissue]
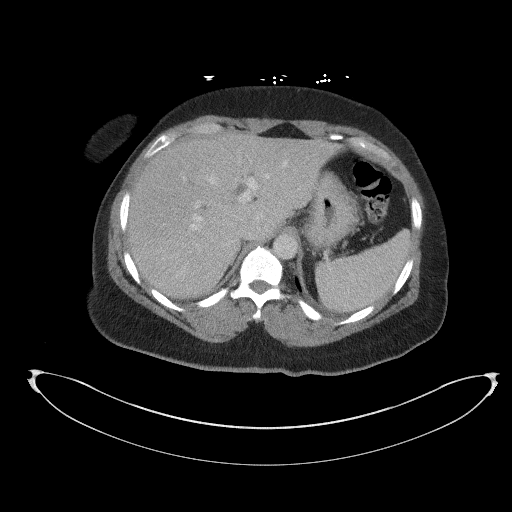
[im 87/92  soft-tissue]
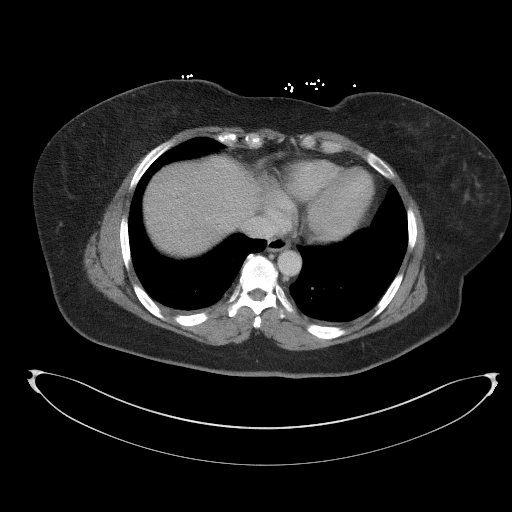

[Series 5: coronal st · coronal · 0.74mm/px · 3 of 103 slices shown]
[im 35/103  soft-tissue]
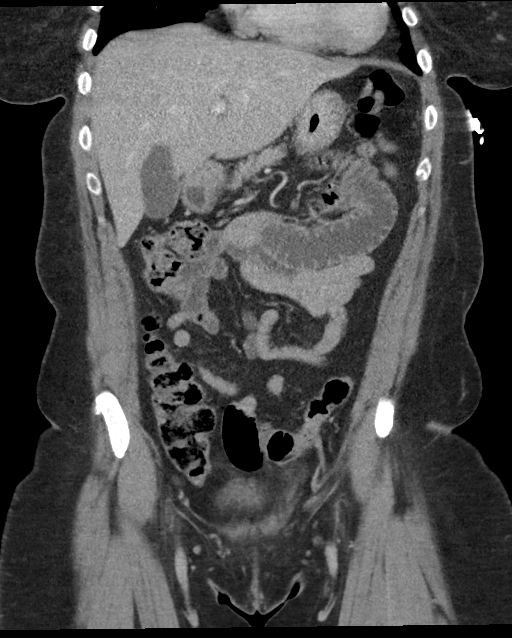
[im 46/103  soft-tissue]
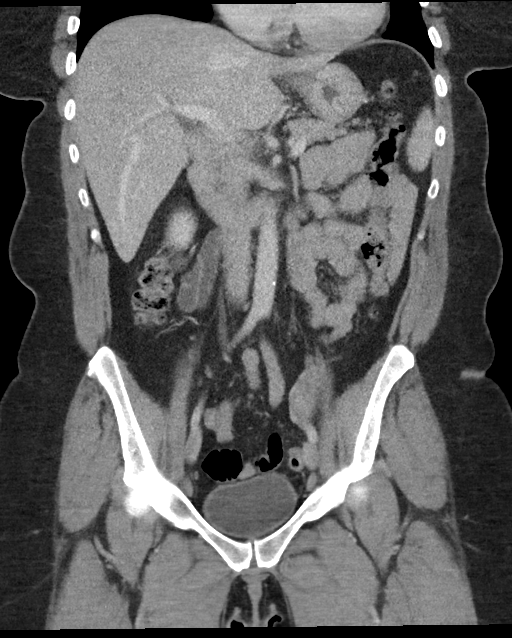
[im 57/103  soft-tissue]
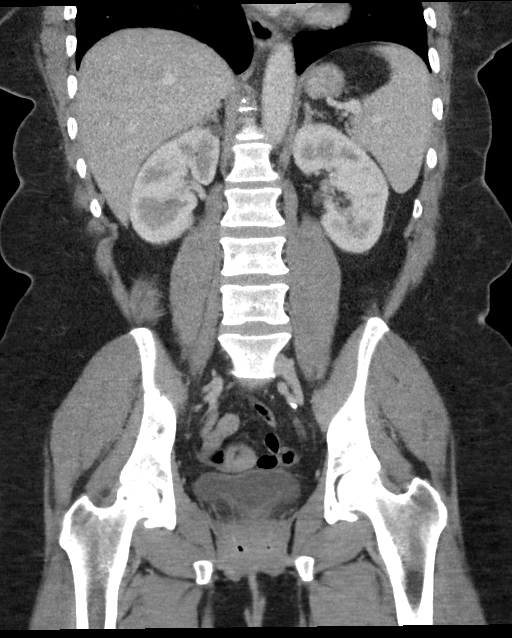

[16 of 46 positions shown; findings below may reference images not displayed]

FINDINGS: Lower chest: No acute abnormality.

Hepatobiliary: Subcentimeter low-density lesion within the left
hepatic lobe, too small to definitively characterize but may
represent a cyst. The liver is otherwise unremarkable. Normal
appearance of the gallbladder. No hyperdense gallstone. No biliary
dilatation.

Pancreas: There is a 7 mm low-density lesion within the uncinate
process of the pancreas (series 2, image 29), possibly cyst.
Pancreas appears otherwise within normal limits. No pancreatic
ductal dilatation or peripancreatic inflammatory changes.

Spleen: Normal in size without focal abnormality.

Adrenals/Urinary Tract: 1 cm upper pole right renal cyst. Kidneys
enhance symmetrically. No stone or hydronephrosis. Unremarkable
bladder. Unremarkable adrenal glands.

Stomach/Bowel: Hyperenhancing appendix measuring up to 15 mm in
diameter with a small amount of fluid surrounding the tip of the
appendix within the low pelvis (series 6, images 49-59). No
organized peritendinous heel fluid collection. No extraluminal air
is seen. Stomach is within normal limits. There is a fluid filled
loop of small bowel within the upper abdomen. Remaining small bowel
is within normal limits. No evidence of bowel obstruction. Colon is
within normal limits.

Vascular/Lymphatic: Scattered aortoiliac atherosclerotic
calcifications without aneurysm. No abdominopelvic lymphadenopathy.

Reproductive: Status post hysterectomy. No adnexal masses.

Other: No pneumoperitoneum. No abdominopelvic abscess. No abdominal
wall hernia.

Musculoskeletal: No acute or significant osseous findings.
IMPRESSION: 1. Acute uncomplicated appendicitis.
2. There is a 7 mm low-density lesion within the uncinate process of
the pancreas, possibly a cyst or IPMN. Further evaluation with
nonemergent pancreatic protocol MRI is recommended.
3. Aortic atherosclerosis.  ([2S]-[2S]).

## 2019-11-05 IMAGING — DX DG CHEST 1V PORT
1 series · 1 of 1 positions shown · non-contrast
Comparison: [DATE]

CLINICAL DATA: Bradycardia.

EXAM:
PORTABLE CHEST 1 VIEW

[chest ap]
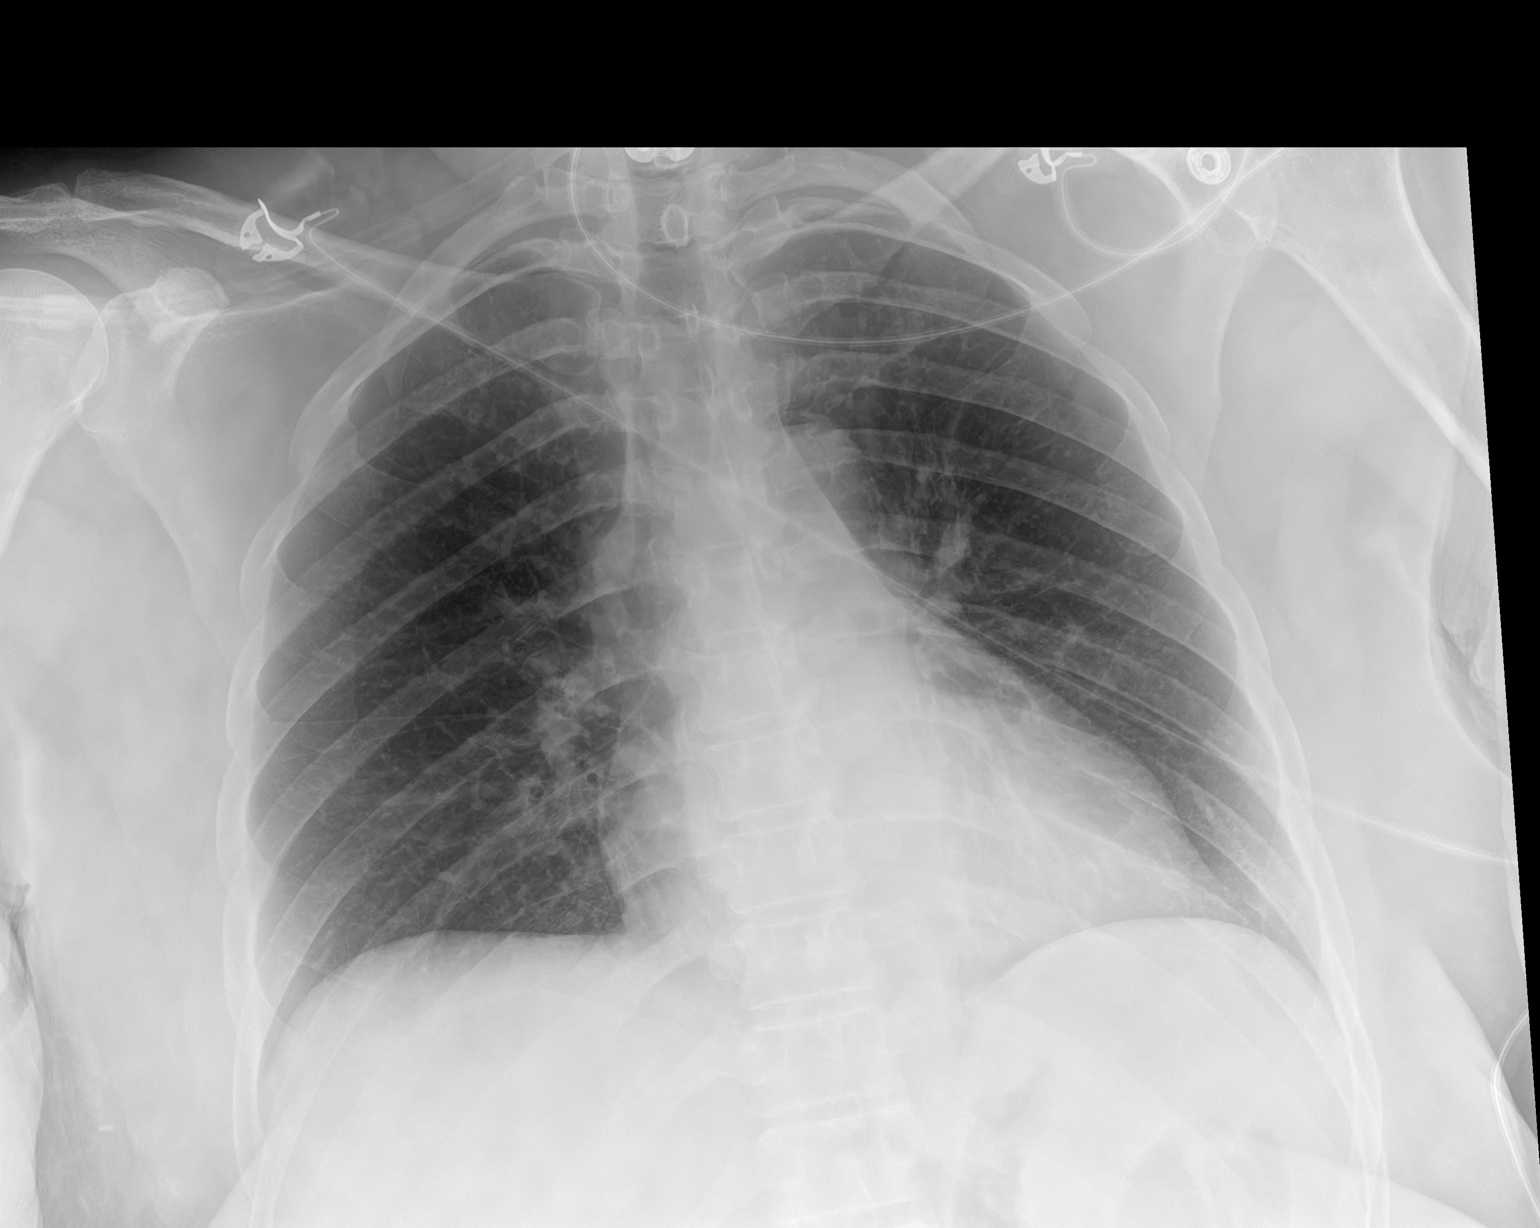

[1 of 1 positions shown; findings below may reference images not displayed]

FINDINGS: The heart size and mediastinal contours are within normal limits.
Both lungs are clear. No pneumothorax or pleural effusion is noted.
The visualized skeletal structures are unremarkable.
IMPRESSION: No active disease.

## 2019-11-05 SURGERY — APPENDECTOMY, LAPAROSCOPIC
Anesthesia: General | Site: Abdomen

## 2019-11-05 MED ORDER — KETOROLAC TROMETHAMINE 30 MG/ML IJ SOLN
INTRAMUSCULAR | Status: AC
  Filled 2019-11-05: qty 1

## 2019-11-05 MED ORDER — BUPIVACAINE LIPOSOME 1.3 % IJ SUSP
INTRAMUSCULAR | Status: AC
  Filled 2019-11-05: qty 20

## 2019-11-05 MED ORDER — PROPOFOL 10 MG/ML IV BOLUS
INTRAVENOUS | Status: AC
  Filled 2019-11-05: qty 40

## 2019-11-05 MED ORDER — FENTANYL CITRATE (PF) 100 MCG/2ML IJ SOLN
25.0000 ug | INTRAMUSCULAR | Status: DC | PRN
  Administered 2019-11-05 (×2): 50 ug via INTRAVENOUS

## 2019-11-05 MED ORDER — CHLORHEXIDINE GLUCONATE CLOTH 2 % EX PADS: 6.0000 | MEDICATED_PAD | Freq: Once | CUTANEOUS | Status: DC

## 2019-11-05 MED ORDER — NEOSTIGMINE METHYLSULFATE 10 MG/10ML IV SOLN
INTRAVENOUS | Status: DC | PRN
  Administered 2019-11-05: 3 mg via INTRAVENOUS

## 2019-11-05 MED ORDER — PROPOFOL 10 MG/ML IV BOLUS
INTRAVENOUS | Status: DC | PRN
  Administered 2019-11-05: 60 mg via INTRAVENOUS
  Administered 2019-11-05: 200 mg via INTRAVENOUS

## 2019-11-05 MED ORDER — LIDOCAINE 2% (20 MG/ML) 5 ML SYRINGE
INTRAMUSCULAR | Status: AC
  Filled 2019-11-05: qty 15

## 2019-11-05 MED ORDER — SODIUM CHLORIDE 0.9 % IV BOLUS
1000.0000 mL | Freq: Once | INTRAVENOUS | Status: AC
  Administered 2019-11-05: 1000 mL via INTRAVENOUS

## 2019-11-05 MED ORDER — SIMETHICONE 80 MG PO CHEW: 40.0000 mg | CHEWABLE_TABLET | Freq: Four times a day (QID) | ORAL | Status: DC | PRN

## 2019-11-05 MED ORDER — FENTANYL CITRATE (PF) 100 MCG/2ML IJ SOLN
INTRAMUSCULAR | Status: DC | PRN
  Administered 2019-11-05 (×2): 100 ug via INTRAVENOUS
  Administered 2019-11-05 (×2): 50 ug via INTRAVENOUS

## 2019-11-05 MED ORDER — MIDAZOLAM HCL 5 MG/5ML IJ SOLN
INTRAMUSCULAR | Status: DC | PRN
  Administered 2019-11-05: 2 mg via INTRAVENOUS

## 2019-11-05 MED ORDER — DIPHENHYDRAMINE HCL 50 MG/ML IJ SOLN: 12.5000 mg | Freq: Four times a day (QID) | INTRAMUSCULAR | Status: DC | PRN

## 2019-11-05 MED ORDER — ROCURONIUM BROMIDE 10 MG/ML (PF) SYRINGE
PREFILLED_SYRINGE | INTRAVENOUS | Status: AC
  Filled 2019-11-05: qty 10

## 2019-11-05 MED ORDER — SODIUM CHLORIDE 0.9 % IV SOLN
2.0000 g | INTRAVENOUS | Status: AC
  Administered 2019-11-05: 2 g via INTRAVENOUS
  Filled 2019-11-05: qty 2

## 2019-11-05 MED ORDER — IOHEXOL 300 MG/ML  SOLN
100.0000 mL | Freq: Once | INTRAMUSCULAR | Status: AC | PRN
  Administered 2019-11-05: 100 mL via INTRAVENOUS

## 2019-11-05 MED ORDER — SUCCINYLCHOLINE CHLORIDE 20 MG/ML IJ SOLN
INTRAMUSCULAR | Status: DC | PRN
  Administered 2019-11-05: 100 mg via INTRAVENOUS

## 2019-11-05 MED ORDER — PROPOFOL 10 MG/ML IV BOLUS
INTRAVENOUS | Status: AC
  Filled 2019-11-05: qty 20

## 2019-11-05 MED ORDER — ORAL CARE MOUTH RINSE: 15.0000 mL | Freq: Once | OROMUCOSAL | Status: DC

## 2019-11-05 MED ORDER — DOCUSATE SODIUM 100 MG PO CAPS
100.0000 mg | ORAL_CAPSULE | Freq: Two times a day (BID) | ORAL | Status: DC
  Administered 2019-11-05 – 2019-11-07 (×4): 100 mg via ORAL
  Filled 2019-11-05 (×4): qty 1

## 2019-11-05 MED ORDER — FENTANYL CITRATE (PF) 100 MCG/2ML IJ SOLN
INTRAMUSCULAR | Status: AC
  Filled 2019-11-05: qty 2

## 2019-11-05 MED ORDER — ONDANSETRON HCL 4 MG/2ML IJ SOLN
4.0000 mg | Freq: Four times a day (QID) | INTRAMUSCULAR | Status: DC | PRN
  Administered 2019-11-06: 4 mg via INTRAVENOUS
  Filled 2019-11-05: qty 2

## 2019-11-05 MED ORDER — ONDANSETRON HCL 4 MG/2ML IJ SOLN: 4.0000 mg | Freq: Once | INTRAMUSCULAR | Status: DC | PRN

## 2019-11-05 MED ORDER — ATROPINE SULFATE 0.4 MG/ML IJ SOLN
INTRAMUSCULAR | Status: DC | PRN
  Administered 2019-11-05: .4 mg via INTRAVENOUS

## 2019-11-05 MED ORDER — LIDOCAINE HCL (CARDIAC) PF 100 MG/5ML IV SOSY
PREFILLED_SYRINGE | INTRAVENOUS | Status: DC | PRN
  Administered 2019-11-05: 100 mg via INTRAVENOUS

## 2019-11-05 MED ORDER — METOPROLOL TARTRATE 5 MG/5ML IV SOLN: 5.0000 mg | Freq: Four times a day (QID) | INTRAVENOUS | Status: DC | PRN

## 2019-11-05 MED ORDER — CHLORHEXIDINE GLUCONATE 0.12 % MT SOLN: 15.0000 mL | Freq: Once | OROMUCOSAL | Status: DC

## 2019-11-05 MED ORDER — GLYCOPYRROLATE 0.2 MG/ML IJ SOLN
INTRAMUSCULAR | Status: DC | PRN
  Administered 2019-11-05: .4 mg via INTRAVENOUS

## 2019-11-05 MED ORDER — ONDANSETRON HCL 4 MG/2ML IJ SOLN
INTRAMUSCULAR | Status: AC
  Filled 2019-11-05: qty 2

## 2019-11-05 MED ORDER — MIDAZOLAM HCL 2 MG/2ML IJ SOLN
INTRAMUSCULAR | Status: AC
  Filled 2019-11-05: qty 2

## 2019-11-05 MED ORDER — ACETAMINOPHEN 325 MG PO TABS
650.0000 mg | ORAL_TABLET | Freq: Four times a day (QID) | ORAL | Status: DC | PRN
  Administered 2019-11-06 (×2): 650 mg via ORAL
  Filled 2019-11-05 (×2): qty 2

## 2019-11-05 MED ORDER — SUCCINYLCHOLINE CHLORIDE 200 MG/10ML IV SOSY
PREFILLED_SYRINGE | INTRAVENOUS | Status: AC
  Filled 2019-11-05: qty 10

## 2019-11-05 MED ORDER — LACTATED RINGERS IV SOLN: INTRAVENOUS | Status: DC

## 2019-11-05 MED ORDER — BUPIVACAINE LIPOSOME 1.3 % IJ SUSP
INTRAMUSCULAR | Status: DC | PRN
  Administered 2019-11-05: 20 mL

## 2019-11-05 MED ORDER — DIPHENHYDRAMINE HCL 12.5 MG/5ML PO ELIX: 12.5000 mg | ORAL_SOLUTION | Freq: Four times a day (QID) | ORAL | Status: DC | PRN

## 2019-11-05 MED ORDER — SODIUM CHLORIDE 0.9 % IR SOLN
Status: DC | PRN
  Administered 2019-11-05: 1000 mL

## 2019-11-05 MED ORDER — ACETAMINOPHEN 650 MG RE SUPP: 650.0000 mg | Freq: Four times a day (QID) | RECTAL | Status: DC | PRN

## 2019-11-05 MED ORDER — MORPHINE SULFATE (PF) 2 MG/ML IV SOLN: 2.0000 mg | INTRAVENOUS | Status: DC | PRN

## 2019-11-05 MED ORDER — MORPHINE SULFATE (PF) 2 MG/ML IV SOLN
1.0000 mg | Freq: Once | INTRAVENOUS | Status: AC
  Administered 2019-11-05: 1 mg via INTRAVENOUS
  Filled 2019-11-05: qty 1

## 2019-11-05 MED ORDER — OXYCODONE HCL 5 MG PO TABS
5.0000 mg | ORAL_TABLET | ORAL | Status: DC | PRN
  Administered 2019-11-05 – 2019-11-06 (×2): 10 mg via ORAL
  Filled 2019-11-05 (×2): qty 2

## 2019-11-05 MED ORDER — ONDANSETRON 4 MG PO TBDP: 4.0000 mg | ORAL_TABLET | Freq: Four times a day (QID) | ORAL | Status: DC | PRN

## 2019-11-05 MED ORDER — ONDANSETRON HCL 4 MG/2ML IJ SOLN
INTRAMUSCULAR | Status: DC | PRN
  Administered 2019-11-05: 4 mg via INTRAVENOUS

## 2019-11-05 MED ORDER — PANTOPRAZOLE SODIUM 40 MG IV SOLR
40.0000 mg | Freq: Every day | INTRAVENOUS | Status: DC
  Administered 2019-11-05 – 2019-11-06 (×2): 40 mg via INTRAVENOUS
  Filled 2019-11-05 (×2): qty 40

## 2019-11-05 MED ORDER — LACTATED RINGERS IV SOLN: INTRAVENOUS | Status: DC | PRN

## 2019-11-05 SURGICAL SUPPLY — 48 items
BAG RETRIEVAL 10 (BASKET) ×1
BLADE SURG 15 STRL LF DISP TIS (BLADE) ×1 IMPLANT
BLADE SURG 15 STRL SS (BLADE) ×1
CHLORAPREP W/TINT 26 (MISCELLANEOUS) ×2 IMPLANT
CLOTH BEACON ORANGE TIMEOUT ST (SAFETY) ×2 IMPLANT
COVER LIGHT HANDLE STERIS (MISCELLANEOUS) ×4 IMPLANT
COVER WAND RF STERILE (DRAPES) ×2 IMPLANT
CUTTER FLEX LINEAR 45M (STAPLE) ×2 IMPLANT
DERMABOND ADVANCED (GAUZE/BANDAGES/DRESSINGS) ×1
DERMABOND ADVANCED .7 DNX12 (GAUZE/BANDAGES/DRESSINGS) ×1 IMPLANT
ELECT REM PT RETURN 9FT ADLT (ELECTROSURGICAL) ×2
ELECTRODE REM PT RTRN 9FT ADLT (ELECTROSURGICAL) ×1 IMPLANT
GLOVE BIO SURGEON STRL SZ 6.5 (GLOVE) ×2 IMPLANT
GLOVE BIO SURGEON STRL SZ7 (GLOVE) ×2 IMPLANT
GLOVE BIOGEL M 6.5 STRL (GLOVE) ×2 IMPLANT
GLOVE BIOGEL PI IND STRL 6.5 (GLOVE) ×3 IMPLANT
GLOVE BIOGEL PI IND STRL 7.0 (GLOVE) ×3 IMPLANT
GLOVE BIOGEL PI INDICATOR 6.5 (GLOVE) ×3
GLOVE BIOGEL PI INDICATOR 7.0 (GLOVE) ×3
GLOVE ECLIPSE 6.5 STRL STRAW (GLOVE) ×2 IMPLANT
GLOVE SURG SS PI 6.5 STRL IVOR (GLOVE) ×2 IMPLANT
GOWN STRL REUS W/TWL LRG LVL3 (GOWN DISPOSABLE) ×8 IMPLANT
INST SET LAPROSCOPIC AP (KITS) ×2 IMPLANT
KIT TURNOVER KIT A (KITS) ×2 IMPLANT
MANIFOLD NEPTUNE II (INSTRUMENTS) ×2 IMPLANT
NEEDLE HYPO 18GX1.5 BLUNT FILL (NEEDLE) ×2 IMPLANT
NEEDLE HYPO 21X1.5 SAFETY (NEEDLE) ×2 IMPLANT
NEEDLE INSUFFLATION 14GA 120MM (NEEDLE) ×2 IMPLANT
NS IRRIG 1000ML POUR BTL (IV SOLUTION) ×2 IMPLANT
PACK LAP CHOLE LZT030E (CUSTOM PROCEDURE TRAY) ×2 IMPLANT
PAD ARMBOARD 7.5X6 YLW CONV (MISCELLANEOUS) ×2 IMPLANT
RELOAD STAPLE TA45 3.5 REG BLU (ENDOMECHANICALS) ×4 IMPLANT
SET BASIN LINEN APH (SET/KITS/TRAYS/PACK) ×2 IMPLANT
SET TUBE IRRIG SUCTION NO TIP (IRRIGATION / IRRIGATOR) IMPLANT
SET TUBE SMOKE EVAC HIGH FLOW (TUBING) ×2 IMPLANT
SHEARS HARMONIC ACE PLUS 36CM (ENDOMECHANICALS) ×2 IMPLANT
SUT MNCRL AB 4-0 PS2 18 (SUTURE) ×4 IMPLANT
SUT VICRYL 0 UR6 27IN ABS (SUTURE) ×2 IMPLANT
SYR 20ML LL LF (SYRINGE) ×4 IMPLANT
SYS BAG RETRIEVAL 10MM (BASKET) ×1
SYSTEM BAG RETRIEVAL 10MM (BASKET) ×1 IMPLANT
TRAY FOLEY W/BAG SLVR 16FR (SET/KITS/TRAYS/PACK) ×1
TRAY FOLEY W/BAG SLVR 16FR ST (SET/KITS/TRAYS/PACK) ×1 IMPLANT
TROCAR ENDO BLADELESS 11MM (ENDOMECHANICALS) ×2 IMPLANT
TROCAR ENDO BLADELESS 12MM (ENDOMECHANICALS) ×2 IMPLANT
TROCAR XCEL NON-BLD 5MMX100MML (ENDOMECHANICALS) ×2 IMPLANT
WARMER LAPAROSCOPE (MISCELLANEOUS) ×2 IMPLANT
YANKAUER SUCT 12FT TUBE ARGYLE (SUCTIONS) ×2 IMPLANT

## 2019-11-05 NOTE — H&P (Signed)
[facility] History and Physical  Reason for Referral: Acute appendicitis  Referring Physician:  Dr. Tomi Bamberger  Chief Complaint    Abdominal Pain      Kara Jimenez is a 59 y.o. female.  HPI: Kara Jimenez is a 59 yo who comes in with 3 days of abdominal pain and associated nausea and vomiting. The pain started more in the left and moved to the right lower quadrant. she has pain with moving her leg and down into her pelvis. The pain is sharp and constant in nature. She has a history of sciatica and fibromyalgia. She has had a colonoscopy and is due in 2026. She has no cardiac history. She is COVID negative. She had her last BM today.   Past Medical History:  Diagnosis Date  . Arthritis   . Breast injury   . Carpal tunnel syndrome   . Fibromyalgia   . Sciatica   . Sleep apnea     Past Surgical History:  Procedure Laterality Date  . BREAST EXCISIONAL BIOPSY Right    benign  . CARPAL TUNNEL RELEASE Bilateral   . CESAREAN SECTION    . NECK SURGERY    . PARTIAL HYSTERECTOMY    . ROTATOR CUFF REPAIR Left   . TUBAL LIGATION      Family History  Problem Relation Age of Onset  . HIV/AIDS Mother   . Hypertension Father     Social History   Tobacco Use  . Smoking status: Never Smoker  . Smokeless tobacco: Never Used  Substance Use Topics  . Alcohol use: Not Currently  . Drug use: Yes    Types: Marijuana    Medications: I have reviewed the patient's current medications. Current Facility-Administered Medications  Medication Dose Route Frequency Provider Last Rate Last Admin  . [START ON 11/06/2019] cefoTEtan (CEFOTAN) 2 g in sodium chloride 0.9 % 100 mL IVPB  2 g Intravenous On Call to OR Virl Cagey, MD      . Chlorhexidine Gluconate Cloth 2 % PADS 6 each  6 each Topical Once Virl Cagey, MD       Current Outpatient Medications  Medication Sig Dispense Refill Last Dose  . acetaminophen (TYLENOL) 500 MG tablet Take 500 mg by mouth every  6 (six) hours as needed.   11/04/2019 at Unknown time  . Brinzolamide-Brimonidine (SIMBRINZA) 1-0.2 % SUSP Apply 1 drop to eye daily.    11/05/2019 at Unknown time  . lisinopril (ZESTRIL) 5 MG tablet Take 5 mg by mouth daily.   11/05/2019 at Unknown time  . tiZANidine (ZANAFLEX) 2 MG tablet Take 2 mg by mouth at bedtime.    11/04/2019 at Unknown time  . vitamin E 1000 UNIT capsule Take 1,000 Units by mouth daily.   11/05/2019 at Unknown time  . hydrochlorothiazide (HYDRODIURIL) 12.5 MG tablet Take 1 tablet (12.5 mg total) by mouth daily. (Patient not taking: Reported on 11/05/2019) 30 tablet 0 Not Taking at Unknown time   Allergies  Allergen Reactions  . Amitriptyline Shortness Of Breath  . Duloxetine Shortness Of Breath  . Gabapentin Shortness Of Breath  . Pregabalin Shortness Of Breath  . Sertraline Shortness Of Breath  . Hydrochlorothiazide Nausea And Vomiting  . Baclofen   . Ibuprofen   . Prednisone     Emesis   . Amlodipine Rash     ROS:  A comprehensive review of systems was negative except for: Gastrointestinal: positive for abdominal pain, nausea and vomiting  Blood pressure 139/87,  pulse 78, temperature 98.3 F (36.8 C), temperature source Oral, resp. rate 17, height 5\' 6"  (1.676 m), weight 103.4 kg, last menstrual period 04/15/2019, SpO2 99 %. Physical Exam Vitals reviewed.  Constitutional:      Appearance: She is well-developed.  HENT:     Head: Normocephalic.  Cardiovascular:     Rate and Rhythm: Normal rate and regular rhythm.  Pulmonary:     Effort: Pulmonary effort is normal.  Abdominal:     General: There is no distension.     Palpations: Abdomen is soft.     Tenderness: There is abdominal tenderness in the right lower quadrant and suprapubic area.  Musculoskeletal:     Comments: Moves all extremities   Skin:    General: Skin is warm and dry.  Neurological:     General: No focal deficit present.     Mental Status: She is alert and oriented to person,  place, and time.  Psychiatric:        Mood and Affect: Mood normal.        Behavior: Behavior normal.     Results: Results for orders placed or performed during the hospital encounter of 11/05/19 (from the past 48 hour(s))  Urinalysis, Routine w reflex microscopic Urine, Clean Catch     Status: Abnormal   Collection Time: 11/05/19 12:33 PM  Result Value Ref Range   Color, Urine STRAW (A) YELLOW   APPearance CLEAR CLEAR   Specific Gravity, Urine 1.005 1.005 - 1.030   pH 5.0 5.0 - 8.0   Glucose, UA NEGATIVE NEGATIVE mg/dL   Hgb urine dipstick MODERATE (A) NEGATIVE   Bilirubin Urine NEGATIVE NEGATIVE   Ketones, ur NEGATIVE NEGATIVE mg/dL   Protein, ur NEGATIVE NEGATIVE mg/dL   Nitrite NEGATIVE NEGATIVE   Leukocytes,Ua NEGATIVE NEGATIVE   RBC / HPF 0-5 0 - 5 RBC/hpf   WBC, UA 0-5 0 - 5 WBC/hpf   Bacteria, UA NONE SEEN NONE SEEN   Squamous Epithelial / LPF 0-5 0 - 5   Mucus PRESENT     Comment: Performed at [facility], [address] [place], [place] [address]  CBC with Differential     Status: None   Collection Time: 11/05/19 12:36 PM  Result Value Ref Range   WBC 7.4 4.0 - 10.5 K/uL   RBC 4.45 3.87 - 5.11 MIL/uL   Hemoglobin 14.2 12.0 - 15.0 g/dL   HCT 42.5 36 - 46 %   MCV 95.5 80.0 - 100.0 fL   MCH 31.9 26.0 - 34.0 pg   MCHC 33.4 30.0 - 36.0 g/dL   RDW 12.7 11.5 - 15.5 %   Platelets 217 150 - 400 K/uL   nRBC 0.0 0.0 - 0.2 %   Neutrophils Relative % 61 %   Neutro Abs 4.6 1.7 - 7.7 K/uL   Lymphocytes Relative 32 %   Lymphs Abs 2.3 0.7 - 4.0 K/uL   Monocytes Relative 5 %   Monocytes Absolute 0.3 0.1 - 1.0 K/uL   Eosinophils Relative 1 %   Eosinophils Absolute 0.0 0.0 - 0.5 K/uL   Basophils Relative 1 %   Basophils Absolute 0.0 0.0 - 0.1 K/uL   Immature Granulocytes 0 %   Abs Immature Granulocytes 0.02 0.00 - 0.07 K/uL    Comment: Performed at [facility], [address], [place], [place] [address]  Comprehensive metabolic panel     Status: None   Collection Time:  11/05/19 12:36 PM  Result Value Ref Range   Sodium  141 135 - 145 mmol/L   Potassium 3.8 3.5 - 5.1 mmol/L   Chloride 105 98 - 111 mmol/L   CO2 26 22 - 32 mmol/L   Glucose, Bld 95 70 - 99 mg/dL    Comment: Glucose reference range applies only to samples taken after fasting for at least 8 hours.   BUN 18 6 - 20 mg/dL   Creatinine, Ser 0.78 0.44 - 1.00 mg/dL   Calcium 9.3 8.9 - 10.3 mg/dL   Total Protein 7.9 6.5 - 8.1 g/dL   Albumin 4.2 3.5 - 5.0 g/dL   AST 18 15 - 41 U/L   ALT 16 0 - 44 U/L   Alkaline Phosphatase 61 38 - 126 U/L   Total Bilirubin 0.7 0.3 - 1.2 mg/dL   GFR, Estimated >60 >60 mL/min    Comment: (NOTE) Calculated using the CKD-EPI Creatinine Equation (2021)    Anion gap 10 5 - 15    Comment: Performed at [facility], [address], [place], [place] [address]  Lipase, blood     Status: None   Collection Time: 11/05/19 12:36 PM  Result Value Ref Range   Lipase 19 11 - 51 U/L    Comment: Performed at [facility], [address] [place], [place] [address]  Respiratory Panel by RT PCR (Flu A&B, Covid) - Nasopharyngeal Swab     Status: None   Collection Time: 11/05/19  2:33 PM   Specimen: Nasopharyngeal Swab  Result Value Ref Range   SARS Coronavirus 2 by RT PCR NEGATIVE NEGATIVE    Comment: (NOTE) SARS-CoV-2 target nucleic acids are NOT DETECTED.  The SARS-CoV-2 RNA is generally detectable in upper respiratoy specimens during the acute phase of infection. The lowest concentration of SARS-CoV-2 viral copies this assay can detect is 131 copies/mL. A negative result does not preclude SARS-Cov-2 infection and should not be used as the sole basis for treatment or other patient management decisions. A negative result may occur with  improper specimen collection/handling, submission of specimen other than nasopharyngeal swab, presence of viral mutation(s) within the areas targeted by this assay, and inadequate number of viral copies (<131 copies/mL). A negative result  must be combined with clinical observations, patient history, and epidemiological information. The expected result is Negative.  Fact Sheet for Patients:  PinkCheek.be  Fact Sheet for Healthcare Providers:  GravelBags.it  This test is no t yet approved or cleared by the [place] FDA and  has been authorized for detection and/or diagnosis of SARS-CoV-2 by FDA under an Emergency Use Authorization (EUA). This EUA will remain  in effect (meaning this test can be used) for the duration of the COVID-19 declaration under Section 564(b)(1) of the Act, 21 U.S.C. section 360bbb-3(b)(1), unless the authorization is terminated or revoked sooner.     Influenza A by PCR NEGATIVE NEGATIVE   Influenza B by PCR NEGATIVE NEGATIVE    Comment: (NOTE) The Xpert Xpress SARS-CoV-2/FLU/RSV assay is intended as an aid in  the diagnosis of influenza from Nasopharyngeal swab specimens and  should not be used as a sole basis for treatment. Nasal washings and  aspirates are unacceptable for Xpert Xpress SARS-CoV-2/FLU/RSV  testing.  Fact Sheet for Patients: PinkCheek.be  Fact Sheet for Healthcare Providers: GravelBags.it  This test is not yet approved or cleared by the [place] FDA and  has been authorized for detection and/or diagnosis of SARS-CoV-2 by  FDA under an Emergency Use Authorization (EUA). This EUA will remain  in effect (meaning this  test can be used) for the duration of the  Covid-19 declaration under Section 564(b)(1) of the Act, 21  U.S.C. section 360bbb-3(b)(1), unless the authorization is  terminated or revoked. Performed at [facility], [address], [place], [place] [address]    Personally reviewed- acute appendicitis with thickened appendix to 1.5cm going into pelvis  CT ABDOMEN PELVIS W CONTRAST  Result Date: 11/05/2019 CLINICAL DATA:  Right lower  quadrant abdominal pain EXAM: CT ABDOMEN AND PELVIS WITH CONTRAST TECHNIQUE: Multidetector CT imaging of the abdomen and pelvis was performed using the standard protocol following bolus administration of intravenous contrast. CONTRAST:  136mL OMNIPAQUE IOHEXOL 300 MG/ML  SOLN COMPARISON:  None. FINDINGS: Lower chest: No acute abnormality. Hepatobiliary: Subcentimeter low-density lesion within the left hepatic lobe, too small to definitively characterize but may represent a cyst. The liver is otherwise unremarkable. Normal appearance of the gallbladder. No hyperdense gallstone. No biliary dilatation. Pancreas: There is a 7 mm low-density lesion within the uncinate process of the pancreas (series 2, image 29), possibly cyst. Pancreas appears otherwise within normal limits. No pancreatic ductal dilatation or peripancreatic inflammatory changes. Spleen: Normal in size without focal abnormality. Adrenals/Urinary Tract: 1 cm upper pole right renal cyst. Kidneys enhance symmetrically. No stone or hydronephrosis. Unremarkable bladder. Unremarkable adrenal glands. Stomach/Bowel: Hyperenhancing appendix measuring up to 15 mm in diameter with a small amount of fluid surrounding the tip of the appendix within the low pelvis (series 6, images 49-59). No organized peritendinous heel fluid collection. No extraluminal air is seen. Stomach is within normal limits. There is a fluid filled loop of small bowel within the upper abdomen. Remaining small bowel is within normal limits. No evidence of bowel obstruction. Colon is within normal limits. Vascular/Lymphatic: Scattered aortoiliac atherosclerotic calcifications without aneurysm. No abdominopelvic lymphadenopathy. Reproductive: Status post hysterectomy. No adnexal masses. Other: No pneumoperitoneum. No abdominopelvic abscess. No abdominal wall hernia. Musculoskeletal: No acute or significant osseous findings. IMPRESSION: 1. Acute uncomplicated appendicitis. 2. There is a 7 mm  low-density lesion within the uncinate process of the pancreas, possibly a cyst or IPMN. Further evaluation with nonemergent pancreatic protocol MRI is recommended. 3. Aortic atherosclerosis.  (ICD10-I70.0). Electronically Signed   By: Davina Poke D.O.   On: 11/05/2019 14:19     Assessment & Plan:  RUBYE STROHMEYER is a 59 y.o. female with acute appendicitis on CT. Discussed laparoscopic appendectomy with the patient and risk of bleeding, infection, finding something other than appendicitis on pathology.   Discussed discharge following surgery as long as she is doing well.   All questions were answered to the satisfaction of the patient.   Virl Cagey 11/05/2019, 4:11 PM

## 2019-11-05 NOTE — Op Note (Addendum)
[facility]  Date of Surgery: 11/05/2019  Admit Date: 11/05/2019   Performing Service: General  Surgeon(s) and Role:    * Virl Cagey, MD - Primary   Pre-operative Diagnosis: Acute Appendicitis  Post-operative Diagnosis: Acute Appendicitis  Procedure Performed: Laparoscopic Appendectomy   Surgeon: Lanell Matar. Constance Haw, MD   Assistant: No qualified resident was available.   Anesthesia: General   Findings:  The appendix was found to be inflamed. There were no signs of necrosis. There was not perforation. There was not abscess formation.  She had extensive adhesions from her prior hysterectomy with the cecum and appendix adherent to the anterior lateral right sided pelvic wall.   Estimated Blood Loss: less than 50 mL   Specimens:  ID Type Source Tests Collected by Time Destination  1 : appendix Tissue PATH Appendix SURGICAL PATHOLOGY Virl Cagey, MD [id number]      Complications: Bradycardia to the 20s intraoperative, required atropine, rebounded to 70-90s, did not lose pulse and had pressure 70/40s during the entire time   Disposition: ICU/Stepdown extubated for cardiac monitoring    Condition: stable   Indications: The patient presented with a 3 day history of right-sided abdominal pain. A CT revealed findings consistent with acute appendicitis with a very enlarged appendix down into the pelvis to 1.5cm.   Procedure Details  Prior to the procedure, the risks, benefits, complications, treatment options, and expected outcomes were discussed with the patient and/or family, including but not limited to the risk of bleeding, infection, finding of a normal appendix, and the need for conversion to an open procedure. There was concurrence with the proposed plan and informed consent was obtained. The patient was taken to the operating room, identified as Kara Jimenez and the procedure verified as Laproscopic Appendectomy.    The patient was  placed in the supine position and general anesthesia was induced, along with placement of orogastric tube, SCD's, and a Foley catheter. The abdomen was prepped and draped in a sterile fashion. The abdomen was entered with Veress technique in the infraumbilical incision. Intraperitoneal placement was confirmed with saline drop, low entry pressures, and easy insufflation. A 11 mm optiview trocar was placed under direct visualization with a 0 degree scope. The 10 mm 0 degree scope was placed in the abdomen and no evidence of injury was identified. A 12 mm port was placed in the left lower quadrant of the abdomen after skin incision with trocar placement under direct vision. There was a loop of bowel that appeared to be the sigmoid colon that was adherent to the left of midline lower abdomen. With care an additional 5 mm port was placed in the suprapubic area under direct vision.  The patient was placed in Trendelenburg and left lateral decubitus position.  The cecum was adherent to the right lower abdominal wall and down into the pelvis and the tip of the appendix was visualized in the pelvis. With great care using scissors and cut/ cautery and harmonic the cecum was taken down from the abdominal wall allowing the appendix to then be fully exposed. The small intestines were retracted in the cephalad and left lateral direction away from the pelvis and right lower quadrant. The patient was found to have an dilated and enlarged appendix that was adherent to the right pelvic anterior side wall.  There was not evidence of perforation. The appendix was very dilated and had a wide base that almost tapered down to the cecum. Given this it was difficult  to determine the exact point of the base.   To better delineate the mesoappendix was taken with the harmonic energy device from the tip toward the base, and again the base was almost tapered down to the cecum. I picked a point on the cecum that seemed to be a good place to  divide, and using two standard endo GIA loads the appendix was divided. The appendix was placed within an Endocatch specimen bag. There was no evidence of bleeding, leakage, or complication after division of the appendix.  Any remaining blood was suctioned out from the abdomen, hemostasis was confirmed. The endocatch bag was removed via the 12 mm port, then the abdomen desufflated. The appendix was passed off the field as a specimen. The bag was intact.  The the 12 mm and 10 mm port sites were closed with a 0 Vicryl suture. The trocar site skin wounds were closed using subcuticular 4-0 Monocryl suture and dermabond.   The patient had an episode of bradycardia to the 20s and required atropine. She never lost pulses and had a blood pressure. Her HR returned to the 70s.  The patient was then awakened from general anesthesia, extubated, and taken to PACU for recovery. She will be monitored in the Stepdown/ ICU unit and EKG, CXR, and labs were obtained.   Instrument, sponge, and needle counts were correct at the conclusion of the case.   Curlene Labrum, MD [facility] [address] [place], [place] [address] [phone]([email or web address])

## 2019-11-05 NOTE — Consult Note (Addendum)
Medical Consultation   NICOYA FRIEL  ONG:[id number]  DOB: [birth year]  DOA: 11/05/2019  PCP: Alfonse Flavors, MD   Outpatient Specialists: General Surgery  Requesting physician: Dr. Constance Haw  Reason for consultation: Bradycardia  History of Present Illness: Kara Jimenez is an 59 y.o. female with history of sleep apnea, presented to the ED today with right lower quadrant pain of 2 days duration.  Patient also reported nausea and vomiting. In the ED, CBC CMP, UA unremarkable, with stable creatinine 0.78, WBC of 7.4.  Stable hemoglobin 14.2.  Negative Covid and influenza test.  Pelvis with contrast showed acute uncomplicated appendicitis, 7 mm low-density lesion within the uncinate process of the pancreas possibly cyst or IPMN-nonemergent pancreatic protocol MRI recommended. General surgery was consulted and patient was taken to the OR for laparoscopic appendectomy.    During procedure, patient had an episode of bradycardia heart rate down to the 20s, lasted about 60 seconds, she required 0.4 mg of atropine with good response.  Patient never lost pulse, and maintain her blood pressure.  Subsequently patient was awakened from anesthesia, extubated and taken to PACU for recovery.  EKG, chest x-ray and labs were ordered.  Review of Systems:  ROS Unable to ascertain review of system, patient, recovering from anesthesia,  Past Medical History: Past Medical History:  Diagnosis Date  . Arthritis   . Breast injury   . Carpal tunnel syndrome   . Fibromyalgia   . Sciatica   . Sleep apnea     Past Surgical History: Past Surgical History:  Procedure Laterality Date  . BREAST EXCISIONAL BIOPSY Right    benign  . CARPAL TUNNEL RELEASE Bilateral   . CESAREAN SECTION    . NECK SURGERY    . PARTIAL HYSTERECTOMY    . ROTATOR CUFF REPAIR Left   . TUBAL LIGATION       Allergies:   Allergies  Allergen Reactions  . Amitriptyline Shortness Of Breath  .  Duloxetine Shortness Of Breath  . Gabapentin Shortness Of Breath  . Pregabalin Shortness Of Breath  . Sertraline Shortness Of Breath  . Hydrochlorothiazide Nausea And Vomiting  . Baclofen   . Ibuprofen   . Prednisone     Emesis   . Amlodipine Rash     Social History:  reports that she has never smoked. She has never used smokeless tobacco. She reports previous alcohol use. She reports current drug use. Drug: Marijuana.   Family History: Family History  Problem Relation Age of Onset  . HIV/AIDS Mother   . Hypertension Father    Physical Exam: Exam limited by patient's postop status.  Vitals:   11/05/19 1800 11/05/19 1803 11/05/19 1815 11/05/19 1824  BP: 136/79 130/77 (!) 141/81 132/70  Pulse: 90     Resp: 20 16 15 10   Temp:      TempSrc:      SpO2:   99%   Weight:      Height:        Constitutional: recovering from anesthesia, awake, but drowsy Eyes: Pupils round equal. ENMT:   Lips appears normal  Neck: neck appears normal, no masses, no thyromegaly CVS: Heartbeat distant, due to habitus, no appreciable murmur rubs or gallops, no LE edema, normal pedal pulses  Respiratory:  clear to auscultation bilaterally, no wheezing, rales or rhonchi. Respiratory effort normal. No accessory muscle use.  Abdomen: soft  nondistended Musculoskeletal: : no cyanosis,  clubbing or edema noted bilaterally                     Neuro: No apparent cranial nerve abnormality psych: recovered from anesthesia, drowsy  Skin: no rashes or lesions or ulcers, no induration or nodules   Data reviewed:  I have personally reviewed following labs and imaging studies Labs:  CBC: Recent Labs  Lab 11/05/19 1236  WBC 7.4  NEUTROABS 4.6  HGB 14.2  HCT 42.5  MCV 95.5  PLT 350    Basic Metabolic Panel: Recent Labs  Lab 11/05/19 1236  NA 141  K 3.8  CL 105  CO2 26  GLUCOSE 95  BUN 18  CREATININE 0.78  CALCIUM 9.3   Estimated Creatinine Clearance: 93.1 mL/min (by C-G formula based on  SCr of 0.78 mg/dL). Liver Function Tests: Recent Labs  Lab 11/05/19 1236  AST 18  ALT 16  ALKPHOS 61  BILITOT 0.7  PROT 7.9  ALBUMIN 4.2   Recent Labs  Lab 11/05/19 1236  LIPASE 19   Urinalysis    Component Value Date/Time   COLORURINE STRAW (A) 11/05/2019 1233   APPEARANCEUR CLEAR 11/05/2019 1233   LABSPEC 1.005 11/05/2019 1233   PHURINE 5.0 11/05/2019 1233   GLUCOSEU NEGATIVE 11/05/2019 1233   [place] (A) 11/05/2019 1233   BILIRUBINUR NEGATIVE 11/05/2019 [place] 11/05/2019 [place] 11/05/2019 1233   NITRITE NEGATIVE 11/05/2019 [place] 11/05/2019 1233     Microbiology Recent Results (from the past 240 hour(s))  Respiratory Panel by RT PCR (Flu A&B, Covid) - Nasopharyngeal Swab     Status: None   Collection Time: 11/05/19  2:33 PM   Specimen: Nasopharyngeal Swab  Result Value Ref Range Status   SARS Coronavirus 2 by RT PCR NEGATIVE NEGATIVE Final    Comment: (NOTE) SARS-CoV-2 target nucleic acids are NOT DETECTED.  The SARS-CoV-2 RNA is generally detectable in upper respiratoy specimens during the acute phase of infection. The lowest concentration of SARS-CoV-2 viral copies this assay can detect is 131 copies/mL. A negative result does not preclude SARS-Cov-2 infection and should not be used as the sole basis for treatment or other patient management decisions. A negative result may occur with  improper specimen collection/handling, submission of specimen other than nasopharyngeal swab, presence of viral mutation(s) within the areas targeted by this assay, and inadequate number of viral copies (<131 copies/mL). A negative result must be combined with clinical observations, patient history, and epidemiological information. The expected result is Negative.  Fact Sheet for Patients:  PinkCheek.be  Fact Sheet for Healthcare Providers:    GravelBags.it  This test is no t yet approved or cleared by the [place] FDA and  has been authorized for detection and/or diagnosis of SARS-CoV-2 by FDA under an Emergency Use Authorization (EUA). This EUA will remain  in effect (meaning this test can be used) for the duration of the COVID-19 declaration under Section 564(b)(1) of the Act, 21 U.S.C. section 360bbb-3(b)(1), unless the authorization is terminated or revoked sooner.     Influenza A by PCR NEGATIVE NEGATIVE Final   Influenza B by PCR NEGATIVE NEGATIVE Final    Comment: (NOTE) The Xpert Xpress SARS-CoV-2/FLU/RSV assay is intended as an aid in  the diagnosis of influenza from Nasopharyngeal swab specimens and  should not be used as a sole basis for treatment. Nasal washings and  aspirates are unacceptable for Xpert Xpress SARS-CoV-2/FLU/RSV  testing.  Fact  Sheet for Patients: PinkCheek.be  Fact Sheet for Healthcare Providers: GravelBags.it  This test is not yet approved or cleared by the [place] FDA and  has been authorized for detection and/or diagnosis of SARS-CoV-2 by  FDA under an Emergency Use Authorization (EUA). This EUA will remain  in effect (meaning this test can be used) for the duration of the  Covid-19 declaration under Section 564(b)(1) of the Act, 21  U.S.C. section 360bbb-3(b)(1), unless the authorization is  terminated or revoked. Performed at [facility], [address], [place], [place] [address]        Inpatient Medications:   Scheduled Meds: . docusate sodium  100 mg Oral BID  . pantoprazole (PROTONIX) IV  40 mg Intravenous QHS   Continuous Infusions: . lactated ringers       Radiological Exams on Admission: CT ABDOMEN PELVIS W CONTRAST  Result Date: 11/05/2019 CLINICAL DATA:  Right lower quadrant abdominal pain EXAM: CT ABDOMEN AND PELVIS WITH CONTRAST TECHNIQUE: Multidetector CT  imaging of the abdomen and pelvis was performed using the standard protocol following bolus administration of intravenous contrast. CONTRAST:  156mL OMNIPAQUE IOHEXOL 300 MG/ML  SOLN COMPARISON:  None. FINDINGS: Lower chest: No acute abnormality. Hepatobiliary: Subcentimeter low-density lesion within the left hepatic lobe, too small to definitively characterize but may represent a cyst. The liver is otherwise unremarkable. Normal appearance of the gallbladder. No hyperdense gallstone. No biliary dilatation. Pancreas: There is a 7 mm low-density lesion within the uncinate process of the pancreas (series 2, image 29), possibly cyst. Pancreas appears otherwise within normal limits. No pancreatic ductal dilatation or peripancreatic inflammatory changes. Spleen: Normal in size without focal abnormality. Adrenals/Urinary Tract: 1 cm upper pole right renal cyst. Kidneys enhance symmetrically. No stone or hydronephrosis. Unremarkable bladder. Unremarkable adrenal glands. Stomach/Bowel: Hyperenhancing appendix measuring up to 15 mm in diameter with a small amount of fluid surrounding the tip of the appendix within the low pelvis (series 6, images 49-59). No organized peritendinous heel fluid collection. No extraluminal air is seen. Stomach is within normal limits. There is a fluid filled loop of small bowel within the upper abdomen. Remaining small bowel is within normal limits. No evidence of bowel obstruction. Colon is within normal limits. Vascular/Lymphatic: Scattered aortoiliac atherosclerotic calcifications without aneurysm. No abdominopelvic lymphadenopathy. Reproductive: Status post hysterectomy. No adnexal masses. Other: No pneumoperitoneum. No abdominopelvic abscess. No abdominal wall hernia. Musculoskeletal: No acute or significant osseous findings. IMPRESSION: 1. Acute uncomplicated appendicitis. 2. There is a 7 mm low-density lesion within the uncinate process of the pancreas, possibly a cyst or IPMN. Further  evaluation with nonemergent pancreatic protocol MRI is recommended. 3. Aortic atherosclerosis.  (ICD10-I70.0). Electronically Signed   By: Davina Poke D.O.   On: 11/05/2019 14:19   DG Chest Port 1 View  Result Date: 11/05/2019 CLINICAL DATA:  Bradycardia. EXAM: PORTABLE CHEST 1 VIEW COMPARISON:  Jun 09, 2019 FINDINGS: The heart size and mediastinal contours are within normal limits. Both lungs are clear. No pneumothorax or pleural effusion is noted. The visualized skeletal structures are unremarkable. IMPRESSION: No active disease. Electronically Signed   By: Marijo Conception M.D.   On: 11/05/2019 18:34    Impression/Recommendations Principal Problem:   Acute appendicitis Active Problems:   Pancreatic lesion  Acute appendicitis-not septic, WBC 7.4, afebrile.  CT showed uncomplicated appendicitis.  -Status post laparoscopic appendectomy by Dr. Constance Haw.  Appendix was inflamed no sign of necrosis, no perforation or abscess, patient had extensive adhesions.  EBL 50 mils. -CBC in  the morning  Bradycardia-heart rate down to 20s, for about 60 seconds, required 0.4 mg of atropine.  Never lost pulse, blood pressure stable.  Heart rate currently 90, blood pressure 132/70.  Portable chest x-ray postprocedure unremarkable. -  EKG before and after procedure shows PR interval prolonged at 230, prior EKG from May 2021 showed PR interval 196-202. - Agree etiology likely vasovagal worsened by the fact that patient has baseline first-degree PR interval prolongation and ?  Anesthetic agent used. - Repeat blood work ordered by general surgery troponin, CBC, BMP, will follow. -Add on TSH, magnesium -Obtain echocardiogram  Prolonged QTC-490.  Potassium 3.8. -Check magnesium.  Hypertension-blood pressure 130s to 140s. -Home HCTZ 12.5, lisinopril 5 mg held for now. -IV metoprolol 5 mg as needed  Incidental CT findings there is a 7 mm low-density lesion within the uncinate process of the pancreas,  possibly a cyst or IPMN. Further evaluation with nonemergent pancreatic protocol MRI is recommended. -Problem list updated.   DVT prophylaxis per general surgery.  Thank you for this consultation.  Our [facility] hospitalist team will follow the patient with you.  Bethena Roys M.D. [facility] 11/05/2019, 8:03 PM

## 2019-11-05 NOTE — Progress Notes (Signed)
[facility]  Notified sister that the procedure was completed. Laparoscopic appendectomy done. Patient with some scarring that required lysis of adhesions to get appendix removed. Discussed bradycardia and need for monitoring as the patient had HR to 20s, required atropine. She never lost pulses and had BP the entire time.  CXR, EKG, troponin, CBC, BMP ordered to assess Hospitalist to consult  Stepdown for monitoring   PRN for pain Diet as tolerated   Curlene Labrum, MD [facility] [place], [place] [address] [phone] (office)

## 2019-11-05 NOTE — ED Notes (Signed)
Pt transported to CT ?

## 2019-11-05 NOTE — ED Triage Notes (Signed)
RLQ pain since Tuesday. Rebound tenderness. Pt actively dry heaving. VSS.

## 2019-11-05 NOTE — Anesthesia Procedure Notes (Signed)
Procedure Name: Intubation Date/Time: 11/05/2019 4:30 PM Performed by: Louann Sjogren, MD Pre-anesthesia Checklist: Patient identified, Suction available, Patient being monitored, Emergency Drugs available and Timeout performed Patient Re-evaluated:Patient Re-evaluated prior to induction Oxygen Delivery Method: Circle system utilized Preoxygenation: Pre-oxygenation with 100% oxygen Induction Type: IV induction and Rapid sequence Ventilation: Mask ventilation without difficulty Laryngoscope Size: Glidescope and 3 Grade View: Grade IV Tube type: Oral Tube size: 7.5 mm Number of attempts: 2 Airway Equipment and Method: Patient positioned with wedge pillow,  Rigid stylet and Video-laryngoscopy Placement Confirmation: ETT inserted through vocal cords under direct vision,  positive ETCO2,  CO2 detector and breath sounds checked- equal and bilateral Secured at: 23 cm Tube secured with: Tape Dental Injury: Teeth and Oropharynx as per pre-operative assessment  Difficulty Due To: Difficulty was unanticipated and Difficult Airway- due to anterior larynx Future Recommendations: Recommend- induction with short-acting agent, and alternative techniques readily available

## 2019-11-05 NOTE — Anesthesia Preprocedure Evaluation (Addendum)
Anesthesia Evaluation  Patient identified by MRN, date of birth, ID band Patient awake    Reviewed: Allergy & Precautions, H&P , NPO status , Patient's Chart, lab work & pertinent test results, reviewed documented beta blocker date and time   Airway Mallampati: II  TM Distance: >3 FB Neck ROM: full    Dental no notable dental hx. (+) Teeth Intact   Pulmonary sleep apnea ,    Pulmonary exam normal breath sounds clear to auscultation       Cardiovascular Exercise Tolerance: Good negative cardio ROS   Rhythm:regular Rate:Normal     Neuro/Psych  Neuromuscular disease negative psych ROS   GI/Hepatic negative GI ROS, Neg liver ROS,   Endo/Other  Morbid obesity  Renal/GU negative Renal ROS  negative genitourinary   Musculoskeletal   Abdominal   Peds  Hematology negative hematology ROS (+)   Anesthesia Other Findings   Reproductive/Obstetrics negative OB ROS                             Anesthesia Physical Anesthesia Plan  ASA: III and emergent  Anesthesia Plan: General   Post-op Pain Management:    Induction:   PONV Risk Score and Plan: Ondansetron  Airway Management Planned:   Additional Equipment:   Intra-op Plan:   Post-operative Plan:   Informed Consent: I have reviewed the patients History and Physical, chart, labs and discussed the procedure including the risks, benefits and alternatives for the proposed anesthesia with the patient or authorized representative who has indicated his/her understanding and acceptance.     Dental Advisory Given  Plan Discussed with: CRNA  Anesthesia Plan Comments:         Anesthesia Quick Evaluation

## 2019-11-05 NOTE — Transfer of Care (Addendum)
Immediate Anesthesia Transfer of Care Note  Patient: Kara Jimenez  Procedure(s) Performed: APPENDECTOMY LAPAROSCOPIC (N/A Abdomen)  Patient Location: PACU  Anesthesia Type:General  Level of Consciousness: awake  Airway & Oxygen Therapy: Patient Spontanous Breathing  Post-op Assessment: Report given to RN, Post -op Vital signs reviewed and stable and Patient moving all extremities  Post vital signs: Reviewed and stable  Last Vitals:  Vitals Value Taken Time  BP 136/79 11/05/19 1800  Temp    Pulse 90 11/05/19 1758  Resp 18 11/05/19 1801  SpO2 98 % 11/05/19 1758  Vitals shown include unvalidated device data.  Last Pain:  Vitals:   11/05/19 1147  TempSrc: Oral  PainSc:          Complications: 60 second period of severe sinus bradycardia on emergence, treated with Atropine 0.4mg .

## 2019-11-05 NOTE — ED Provider Notes (Signed)
[facility] EMERGENCY DEPARTMENT Provider Note   CSN: [id number] Arrival date & time: 11/05/19  1142     History Chief Complaint  Patient presents with  . Abdominal Pain    Kara Jimenez is a 59 y.o. female.  HPI 59 year old female with a history of fibromyalgia, sciatica, arthritis presents to the ER with 2 days of right lower quadrant pain.  Pain came on fairly suddenly and has been constant over the last 2 days.  Patient is also noted some nausea and nonbloody nonbilious vomiting.  The pain does not travel anywhere.  Denies any blood in her urine or dysuria.  She does still have her appendix in.  No prior history of diverticulitis.  Denies any fevers or chills.  She has not taken anything for her symptoms.    Past Medical History:  Diagnosis Date  . Arthritis   . Breast injury   . Carpal tunnel syndrome   . Fibromyalgia   . Sciatica   . Sleep apnea     There are no problems to display for this patient.   Past Surgical History:  Procedure Laterality Date  . BREAST EXCISIONAL BIOPSY Right    benign  . CARPAL TUNNEL RELEASE Bilateral   . CESAREAN SECTION    . NECK SURGERY    . PARTIAL HYSTERECTOMY    . ROTATOR CUFF REPAIR Left   . TUBAL LIGATION       OB History   No obstetric history on file.     Family History  Problem Relation Age of Onset  . HIV/AIDS Mother   . Hypertension Father     Social History   Tobacco Use  . Smoking status: Never Smoker  . Smokeless tobacco: Never Used  Substance Use Topics  . Alcohol use: Not Currently  . Drug use: Yes    Types: Marijuana    Home Medications Prior to Admission medications   Medication Sig Start Date End Date Taking? Authorizing Provider  acetaminophen (TYLENOL) 500 MG tablet Take 500 mg by mouth every 6 (six) hours as needed.    [provider]  Brinzolamide-Brimonidine (SIMBRINZA) 1-0.2 % SUSP Apply 1 drop to eye daily.  02/27/17   [provider]  hydrochlorothiazide  (HYDRODIURIL) 12.5 MG tablet Take 1 tablet (12.5 mg total) by mouth daily. 06/09/19 07/09/19  Volanda Napoleon, PA-C    Allergies    Amitriptyline, Duloxetine, Gabapentin, Pregabalin, Sertraline, Baclofen, Ibuprofen, and Prednisone  Review of Systems   Review of Systems  Constitutional: Negative for chills and fever.  HENT: Negative for ear pain and sore throat.   Eyes: Negative for pain and visual disturbance.  Respiratory: Negative for cough and shortness of breath.   Cardiovascular: Negative for chest pain and palpitations.  Gastrointestinal: Positive for abdominal pain, nausea and vomiting. Negative for diarrhea.  Genitourinary: Negative for dysuria, flank pain and hematuria.  Musculoskeletal: Negative for arthralgias and back pain.  Skin: Negative for color change and rash.  Neurological: Negative for seizures and syncope.  All other systems reviewed and are negative.   Physical Exam Updated Vital Signs BP 139/87   Pulse 78   Temp 98.3 F (36.8 C) (Oral)   Resp 17   Ht 5\' 6"  (1.676 m)   Wt 103.4 kg   LMP 04/15/2019   SpO2 99%   BMI 36.80 kg/m   Physical Exam Vitals and nursing note reviewed.  Constitutional:      General: She is not in acute distress.  Appearance: She is well-developed.  HENT:     Head: Normocephalic and atraumatic.  Eyes:     Conjunctiva/sclera: Conjunctivae normal.  Cardiovascular:     Rate and Rhythm: Normal rate and regular rhythm.     Heart sounds: No murmur heard.   Pulmonary:     Effort: Pulmonary effort is normal. No respiratory distress.     Breath sounds: Normal breath sounds.  Chest:     Chest wall: Tenderness present.  Abdominal:     General: Abdomen is flat.     Palpations: Abdomen is soft.     Tenderness: There is abdominal tenderness in the right lower quadrant. There is rebound. Positive signs include McBurney's sign. Negative signs include Murphy's sign and Rovsing's sign.     Hernia: No hernia is present.   Musculoskeletal:     Cervical back: Neck supple.  Skin:    General: Skin is warm and dry.  Neurological:     General: No focal deficit present.     Mental Status: She is alert.  Psychiatric:        Mood and Affect: Mood normal.        Behavior: Behavior normal.     ED Results / Procedures / Treatments   Labs (all labs ordered are listed, but only abnormal results are displayed) Labs Reviewed  URINALYSIS, ROUTINE W REFLEX MICROSCOPIC - Abnormal; Notable for the following components:      Result Value   Color, Urine STRAW (*)    Hgb urine dipstick MODERATE (*)    All other components within normal limits  RESPIRATORY PANEL BY RT PCR (FLU A&B, COVID)  CBC WITH DIFFERENTIAL/PLATELET  COMPREHENSIVE METABOLIC PANEL  LIPASE, BLOOD    EKG EKG Interpretation  Date/Time:  Thursday November 05 2019 12:19:02 EDT Ventricular Rate:  79 PR Interval:    QRS Duration: 97 QT Interval:  395 QTC Calculation: 453 R Axis:   97 Text Interpretation: Sinus rhythm Borderline prolonged PR interval Probable left atrial enlargement Borderline right axis deviation No significant change since last tracing Confirmed by Dorie Rank [phone]) on 11/05/2019 12:21:00 PM   Radiology CT ABDOMEN PELVIS W CONTRAST  Result Date: 11/05/2019 CLINICAL DATA:  Right lower quadrant abdominal pain EXAM: CT ABDOMEN AND PELVIS WITH CONTRAST TECHNIQUE: Multidetector CT imaging of the abdomen and pelvis was performed using the standard protocol following bolus administration of intravenous contrast. CONTRAST:  149mL OMNIPAQUE IOHEXOL 300 MG/ML  SOLN COMPARISON:  None. FINDINGS: Lower chest: No acute abnormality. Hepatobiliary: Subcentimeter low-density lesion within the left hepatic lobe, too small to definitively characterize but may represent a cyst. The liver is otherwise unremarkable. Normal appearance of the gallbladder. No hyperdense gallstone. No biliary dilatation. Pancreas: There is a 7 mm low-density lesion within  the uncinate process of the pancreas (series 2, image 29), possibly cyst. Pancreas appears otherwise within normal limits. No pancreatic ductal dilatation or peripancreatic inflammatory changes. Spleen: Normal in size without focal abnormality. Adrenals/Urinary Tract: 1 cm upper pole right renal cyst. Kidneys enhance symmetrically. No stone or hydronephrosis. Unremarkable bladder. Unremarkable adrenal glands. Stomach/Bowel: Hyperenhancing appendix measuring up to 15 mm in diameter with a small amount of fluid surrounding the tip of the appendix within the low pelvis (series 6, images 49-59). No organized peritendinous heel fluid collection. No extraluminal air is seen. Stomach is within normal limits. There is a fluid filled loop of small bowel within the upper abdomen. Remaining small bowel is within normal limits. No evidence of bowel obstruction. Colon is  within normal limits. Vascular/Lymphatic: Scattered aortoiliac atherosclerotic calcifications without aneurysm. No abdominopelvic lymphadenopathy. Reproductive: Status post hysterectomy. No adnexal masses. Other: No pneumoperitoneum. No abdominopelvic abscess. No abdominal wall hernia. Musculoskeletal: No acute or significant osseous findings. IMPRESSION: 1. Acute uncomplicated appendicitis. 2. There is a 7 mm low-density lesion within the uncinate process of the pancreas, possibly a cyst or IPMN. Further evaluation with nonemergent pancreatic protocol MRI is recommended. 3. Aortic atherosclerosis.  (ICD10-I70.0). Electronically Signed   By: Davina Poke D.O.   On: 11/05/2019 14:19    Procedures Procedures (including critical care time)  Medications Ordered in ED Medications  morphine 2 MG/ML injection 1 mg (1 mg Intravenous Given 11/05/19 1250)  sodium chloride 0.9 % bolus 1,000 mL (0 mLs Intravenous Stopped 11/05/19 1510)  iohexol (OMNIPAQUE) 300 MG/ML solution 100 mL (100 mLs Intravenous Contrast Given 11/05/19 1354)    ED Course  I have  reviewed the triage vital signs and the nursing notes.  Pertinent labs & imaging results that were available during my care of the patient were reviewed by me and considered in my medical decision making (see chart for details).    MDM Rules/Calculators/A&P                         59 year old female with 2 days of right lower quadrant pain On presentation, she is alert, oriented, nontoxic-appearing, no acute distress, resting comfortably in the ER bed.  Not actively vomiting.  Vital signs are reassuring, afebrile.  CMP and CBC unremarkable, normal lipase.  UA with moderate hemoglobin but no evidence of UTI.  CT of the abdomen with uncomplicated appendicitis.  Dr. Nelida Gores with general surgery consulted.  She will admit the patient.  Patient given IV fluids, morphine, made n.p.o.  Hemodynamically stable at this time.  Final Clinical Impression(s) / ED Diagnoses Final diagnoses:  Acute appendicitis, unspecified acute appendicitis type    Rx / DC Orders ED Discharge Orders    None       Lyndel Safe 11/05/19 1531    Dorie Rank, MD 11/05/19 1819

## 2019-11-06 ENCOUNTER — Other Ambulatory Visit: Payer: Self-pay | Admitting: Physician Assistant

## 2019-11-06 ENCOUNTER — Observation Stay (HOSPITAL_BASED_OUTPATIENT_CLINIC_OR_DEPARTMENT_OTHER): Payer: Medicare Other

## 2019-11-06 ENCOUNTER — Telehealth: Payer: Self-pay

## 2019-11-06 ENCOUNTER — Encounter (HOSPITAL_COMMUNITY): Payer: Self-pay | Admitting: General Surgery

## 2019-11-06 ENCOUNTER — Ambulatory Visit: Payer: Medicare Other

## 2019-11-06 DIAGNOSIS — Z8249 Family history of ischemic heart disease and other diseases of the circulatory system: Secondary | ICD-10-CM | POA: Diagnosis not present

## 2019-11-06 DIAGNOSIS — Z79899 Other long term (current) drug therapy: Secondary | ICD-10-CM | POA: Diagnosis not present

## 2019-11-06 DIAGNOSIS — K869 Disease of pancreas, unspecified: Secondary | ICD-10-CM | POA: Diagnosis not present

## 2019-11-06 DIAGNOSIS — K660 Peritoneal adhesions (postprocedural) (postinfection): Secondary | ICD-10-CM | POA: Diagnosis present

## 2019-11-06 DIAGNOSIS — R001 Bradycardia, unspecified: Secondary | ICD-10-CM

## 2019-11-06 DIAGNOSIS — Z888 Allergy status to other drugs, medicaments and biological substances status: Secondary | ICD-10-CM | POA: Diagnosis not present

## 2019-11-06 DIAGNOSIS — Z886 Allergy status to analgesic agent status: Secondary | ICD-10-CM | POA: Diagnosis not present

## 2019-11-06 DIAGNOSIS — I440 Atrioventricular block, first degree: Secondary | ICD-10-CM | POA: Diagnosis present

## 2019-11-06 DIAGNOSIS — K3580 Unspecified acute appendicitis: Principal | ICD-10-CM

## 2019-11-06 DIAGNOSIS — M1990 Unspecified osteoarthritis, unspecified site: Secondary | ICD-10-CM | POA: Diagnosis present

## 2019-11-06 DIAGNOSIS — I10 Essential (primary) hypertension: Secondary | ICD-10-CM | POA: Diagnosis present

## 2019-11-06 DIAGNOSIS — Z90711 Acquired absence of uterus with remaining cervical stump: Secondary | ICD-10-CM | POA: Diagnosis not present

## 2019-11-06 DIAGNOSIS — Z20822 Contact with and (suspected) exposure to covid-19: Secondary | ICD-10-CM | POA: Diagnosis present

## 2019-11-06 DIAGNOSIS — M797 Fibromyalgia: Secondary | ICD-10-CM | POA: Diagnosis present

## 2019-11-06 DIAGNOSIS — E669 Obesity, unspecified: Secondary | ICD-10-CM | POA: Diagnosis not present

## 2019-11-06 DIAGNOSIS — R1031 Right lower quadrant pain: Secondary | ICD-10-CM | POA: Diagnosis present

## 2019-11-06 DIAGNOSIS — Z6836 Body mass index (BMI) 36.0-36.9, adult: Secondary | ICD-10-CM | POA: Diagnosis not present

## 2019-11-06 DIAGNOSIS — G4733 Obstructive sleep apnea (adult) (pediatric): Secondary | ICD-10-CM | POA: Diagnosis present

## 2019-11-06 DIAGNOSIS — F1290 Cannabis use, unspecified, uncomplicated: Secondary | ICD-10-CM | POA: Diagnosis present

## 2019-11-06 LAB — CBC WITH DIFFERENTIAL/PLATELET
Abs Immature Granulocytes: 0.02 10*3/uL (ref 0.00–0.07)
Basophils Absolute: 0 10*3/uL (ref 0.0–0.1)
Basophils Relative: 0 %
Eosinophils Absolute: 0.1 10*3/uL (ref 0.0–0.5)
Eosinophils Relative: 1 %
HCT: 37.4 % (ref 36.0–46.0)
Hemoglobin: 12 g/dL (ref 12.0–15.0)
Immature Granulocytes: 0 %
Lymphocytes Relative: 29 %
Lymphs Abs: 2.1 10*3/uL (ref 0.7–4.0)
MCH: 31.7 pg (ref 26.0–34.0)
MCHC: 32.1 g/dL (ref 30.0–36.0)
MCV: 98.7 fL (ref 80.0–100.0)
Monocytes Absolute: 0.4 10*3/uL (ref 0.1–1.0)
Monocytes Relative: 5 %
Neutro Abs: 4.8 10*3/uL (ref 1.7–7.7)
Neutrophils Relative %: 65 %
Platelets: 195 10*3/uL (ref 150–400)
RBC: 3.79 MIL/uL — ABNORMAL LOW (ref 3.87–5.11)
RDW: 13.2 % (ref 11.5–15.5)
WBC: 7.4 10*3/uL (ref 4.0–10.5)
nRBC: 0 % (ref 0.0–0.2)

## 2019-11-06 LAB — ECHOCARDIOGRAM COMPLETE
Area-P 1/2: 2.74 cm2
Height: 66 in
S' Lateral: 2.13 cm
Weight: 3648 oz

## 2019-11-06 LAB — BASIC METABOLIC PANEL
Anion gap: 7 (ref 5–15)
BUN: 14 mg/dL (ref 6–20)
CO2: 27 mmol/L (ref 22–32)
Calcium: 7.9 mg/dL — ABNORMAL LOW (ref 8.9–10.3)
Chloride: 105 mmol/L (ref 98–111)
Creatinine, Ser: 0.86 mg/dL (ref 0.44–1.00)
GFR, Estimated: >60 mL/min (ref >60)
Glucose, Bld: 91 mg/dL (ref 70–99)
Potassium: 3.5 mmol/L (ref 3.5–5.1)
Sodium: 139 mmol/L (ref 135–145)

## 2019-11-06 LAB — MRSA PCR SCREENING: MRSA by PCR: POSITIVE — AB

## 2019-11-06 LAB — MAGNESIUM: Magnesium: 1.9 mg/dL (ref 1.7–2.4)

## 2019-11-06 MED ORDER — ENOXAPARIN SODIUM 40 MG/0.4ML ~~LOC~~ SOLN
40.0000 mg | SUBCUTANEOUS | Status: DC
  Administered 2019-11-06: 40 mg via SUBCUTANEOUS
  Filled 2019-11-06: qty 0.4

## 2019-11-06 MED ORDER — DOCUSATE SODIUM 100 MG PO CAPS: 100.0000 mg | ORAL_CAPSULE | Freq: Two times a day (BID) | ORAL | 0 refills | Status: DC | PRN

## 2019-11-06 MED ORDER — OXYCODONE HCL 5 MG PO TABS: 5.0000 mg | ORAL_TABLET | ORAL | 0 refills | Status: DC | PRN

## 2019-11-06 MED ORDER — PROMETHAZINE HCL 25 MG/ML IJ SOLN
12.5000 mg | Freq: Once | INTRAMUSCULAR | Status: AC
  Administered 2019-11-06: 12.5 mg via INTRAVENOUS
  Filled 2019-11-06: qty 1

## 2019-11-06 MED ORDER — CHLORHEXIDINE GLUCONATE CLOTH 2 % EX PADS
6.0000 | MEDICATED_PAD | Freq: Every day | CUTANEOUS | Status: DC
  Administered 2019-11-06 – 2019-11-07 (×2): 6 via TOPICAL

## 2019-11-06 MED ORDER — MUPIROCIN 2 % EX OINT
1.0000 "application " | TOPICAL_OINTMENT | Freq: Two times a day (BID) | CUTANEOUS | Status: DC
  Administered 2019-11-06: 1 via NASAL
  Filled 2019-11-06 (×2): qty 22

## 2019-11-06 MED ORDER — CHLORHEXIDINE GLUCONATE CLOTH 2 % EX PADS
6.0000 | MEDICATED_PAD | Freq: Every day | CUTANEOUS | Status: DC
  Administered 2019-11-06: 6 via TOPICAL

## 2019-11-06 MED ORDER — INFLUENZA VAC SPLIT QUAD 0.5 ML IM SUSY: 0.5000 mL | PREFILLED_SYRINGE | INTRAMUSCULAR | Status: DC

## 2019-11-06 MED ORDER — PNEUMOCOCCAL VAC POLYVALENT 25 MCG/0.5ML IJ INJ: 0.5000 mL | INJECTION | INTRAMUSCULAR | Status: DC

## 2019-11-06 MED ORDER — PROMETHAZINE HCL 25 MG/ML IJ SOLN: 12.5000 mg | Freq: Four times a day (QID) | INTRAMUSCULAR | Status: DC | PRN

## 2019-11-06 MED ORDER — ONDANSETRON 4 MG PO TBDP: 4.0000 mg | ORAL_TABLET | Freq: Four times a day (QID) | ORAL | 0 refills | Status: DC | PRN

## 2019-11-06 NOTE — Telephone Encounter (Signed)
Zio 7 day monitor for bradycardia placed in ICU bed #6 today per dr.Branch.Monitor number [id number]. Patient verbalized understanding of how to return monitor.

## 2019-11-06 NOTE — Discharge Instructions (Signed)
Discharge Laparoscopic Surgery Instructions:  Common Complaints: Right shoulder pain is common after laparoscopic surgery. This is secondary to the gas used in the surgery being trapped under the diaphragm.  Walk to help your body absorb the gas. This will improve in a few days. Pain at the port sites are common, especially the larger port sites. This will improve with time.  Some nausea is common and poor appetite. The main goal is to stay hydrated the first few days after surgery.   Diet/ Activity: Diet as tolerated. You may not have an appetite, but it is important to stay hydrated. Drink 64 ounces of water a day. Your appetite will return with time.  Shower per your regular routine daily.  Do not take hot showers. Take warm showers that are less than 10 minutes. Rest and listen to your body, but do not remain in bed all day.  Walk everyday for at least 15-20 minutes. Deep cough and move around every 1-2 hours in the first few days after surgery.  Do not lift > 10 lbs, perform excessive bending, pushing, pulling, squatting for 1-2 weeks after surgery.  Do not pick at the dermabond glue on your incision sites.  This glue film will remain in place for 1-2 weeks and will start to peel off.  Do not place lotions or balms on your incision unless instructed to specifically by Dr. Lorane Cousar.   Pain Expectations and Narcotics: -After surgery you will have pain associated with your incisions and this is normal. The pain is muscular and nerve pain, and will get better with time. -You are encouraged and expected to take non narcotic medications like tylenol and ibuprofen (when able) to treat pain as multiple modalities can aid with pain treatment. -Narcotics are only used when pain is severe or there is breakthrough pain. -You are not expected to have a pain score of 0 after surgery, as we cannot prevent pain. A pain score of 3-4 that allows you to be functional, move, walk, and tolerate some activity is  the goal. The pain will continue to improve over the days after surgery and is dependent on your surgery. -Due to [place] law, we are only able to give a certain amount of pain medication to treat post operative pain, and we only give additional narcotics on a patient by patient basis.  -For most laparoscopic surgery, studies have shown that the majority of patients only need 10-15 narcotic pills, and for open surgeries most patients only need 15-20.   -Having appropriate expectations of pain and knowledge of pain management with non narcotics is important as we do not want anyone to become addicted to narcotic pain medication.  -Using ice packs in the first 48 hours and heating pads after 48 hours, wearing an abdominal binder (when recommended), and using over the counter medications are all ways to help with pain management.   -Simple acts like meditation and mindfulness practices after surgery can also help with pain control and research has proven the benefit of these practices.  Medication: Take tylenol and ibuprofen as needed for pain control, alternating every 4-6 hours.  Example:  Tylenol 1000mg @ 6am, 12noon, 6pm, 12midnight (Do not exceed 4000mg of tylenol a day). Ibuprofen 800mg @ 9am, 3pm, 9pm, 3am (Do not exceed 3600mg of ibuprofen a day).  Take Roxicodone for breakthrough pain every 4 hours.  Take Colace for constipation related to narcotic pain medication. If you do not have a bowel movement in 2 days, take Miralax   over the counter.  Drink plenty of water to also prevent constipation.   Contact Information: If you have questions or concerns, please call our office, [phone], Monday- Thursday 8AM-5PM and Friday 8AM-12Noon.  If it is after hours or on the weekend, please call [facility] Main [facility], [phone], and ask to speak to the surgeon on call for Dr. Constance Haw at [facility].     Laparoscopic Appendectomy, Adult, Care After This sheet gives you information about how to care for  yourself after your procedure. Your doctor may also give you more specific instructions. If you have problems or questions, contact your doctor. What can I expect after the procedure? After the procedure, it is common to have:  Little energy for normal activities.  Mild pain in the area where the cuts from surgery (incisions) were made.  Trouble pooping (constipation). This can be caused by: ? Pain medicine. ? A lack of activity. Follow these instructions at home: Medicines  Take over-the-counter and prescription medicines only as told by your doctor.  If you were prescribed an antibiotic medicine, take it as told by your doctor. Do not stop taking it even if you start to feel better.  Do not drive or use heavy machinery while taking prescription pain medicine.  Ask your doctor if the medicine you are taking can cause trouble pooping. You may need to take steps to prevent or treat trouble pooping: ? Drink enough fluid to keep your pee (urine) pale yellow. ? Take over-the-counter or prescription medicines. ? Eat foods that are high in fiber. These include beans, whole grains, and fresh fruits and vegetables. ? Limit foods that are high in fat and sugar. These include fried or sweet foods. Incision care   Follow instructions from your doctor about how to take care of your cuts from surgery. Make sure you: ? Wash your hands with soap and water before and after you change your bandage (dressing). If you cannot use soap and water, use hand sanitizer. ? Change your bandage as told by your doctor. ? Leave stitches (sutures), skin glue, or skin tape (adhesive) strips in place. They may need to stay in place for 2 weeks or longer. If tape strips get loose and curl up, you may trim the loose edges. Do not remove tape strips completely unless your doctor says it is okay.  Check your cuts from surgery every day for signs of infection. Check for: ? Redness, swelling, or pain. ? Fluid or  blood. ? Warmth. ? Pus or a bad smell. Bathing  Keep your cuts from surgery clean and dry. Clean them as told by your doctor. To do this: 1. Gently wash the cuts with soap and water. 2. Rinse the cuts with water to remove all soap. 3. Pat the cuts dry with a clean towel. Do not rub the cuts.  Do not take baths, swim, or use a hot tub for 2 weeks, or until your doctor says it is okay. You may take showers after 48 hours. Activity   Do not drive for 24 hours if you were given a medicine to help you relax (sedative) during your procedure.  Rest after the procedure. Return to your normal activities as told by your doctor. Ask your doctor what activities are safe for you.  For 3 weeks, or for as long as told by your doctor: ? Do not lift anything that is heavier than 10 lb (4.5 kg), or the limit that you are told. ? Do not play  contact sports. General instructions  If you were sent home with a drain, follow instructions from your doctor on how to care for it.  Take deep breaths. This helps to keep your lungs from getting an infection (pneumonia).  Keep all follow-up visits as told by your doctor. This is important. Contact a doctor if:  You have redness, swelling, or pain around a cut from surgery.  You have fluid or blood coming from a cut.  Your cut feels warm to the touch.  You have pus or a bad smell coming from a cut or a bandage.  The edges of a cut break open after the stitches have been taken out.  You have pain in your shoulders that gets worse.  You feel dizzy or you pass out (faint).  You have shortness of breath.  You keep feeling sick to your stomach (nauseous).  You keep throwing up (vomiting).  You get watery poop (diarrhea) or you cannot control your poop.  You lose your appetite.  You have swelling or pain in your legs.  You get a rash. Get help right away if:  You have a fever.  You have trouble breathing.  You have sharp pains in your  chest. Summary  After the procedure, it is common to have low energy, mild pain, and trouble pooping.  Infection is a common problem after this procedure. Follow your doctor's instructions about caring for yourself after the procedure.  Rest after the procedure. Return to your normal activities as told by your doctor.  Contact your doctor if you see signs of infection around your cuts from surgery, or you get short of breath. Get help right away if you have a fever, chest pain, or trouble breathing. This information is not intended to replace advice given to you by your health care provider. Make sure you discuss any questions you have with your health care provider. Document Revised: 07/04/2017 Document Reviewed: 07/04/2017 Elsevier Patient Education  [place].   Cyst on Pancreas. Will need outpatient MRI.

## 2019-11-06 NOTE — Progress Notes (Signed)
PROGRESS NOTE  Kara KWIECINSKI OEV:[id number] DOB: Jul 04, 1960 DOA: 11/05/2019 PCP: Alfonse Flavors, MD  Brief History:  59 year old female with a history of OSA, fibromyalgia, and hypertension presenting with 3-day history of nausea, vomiting, and abdominal pain.  In the emergency department, CT of the abdomen and pelvis showed a hyperenhancing appendix up to 50 mm with a small amount of fluid at the tip suggestive of acute appendicitis.  There is also a 7 mm low-density lesion in the uncinate process of the pancreas.  General surgery was consulted.  Dr. Constance Haw took the patient to the operating room and performed a laparoscopic appendectomy on 11/05/2019.  Intraoperatively, the patient had bradycardia into the 20s with hypotension with a pressure of 70/40.  The patient was given atropine with improvement of her heart rate back to 70-90 as well as improvement of her blood pressure.  In the postoperative period, the patient did not have any further bradycardia. In speaking with the patient, the patient denies any previous history of bradycardia or cardiac issues.  She denied any recent palpitations, chest pain, shortness breath, dizziness, syncope. She stated that she was started on new medication, tinazidine, approximately 1 week prior to this hospital admission.  The patient uses marijuana multiple times per week, but denies any tobacco alcohol or other illicit substances.  The patient was admitted overnight and monitored on telemetry. Her preoperative EKG showed sinus rhythm with first-degree AV block and QTC 453.  In reviewing her medical record, the patient has an EKG on 06/09/2019 which showed QTC of 435 with sinus rhythm and PR interval 202.  Assessment/Plan: Bradycardia -Treated with atropine intraoperatively -The patient had some mild nocturnal bradycardia (upper 40s) overnight 11/05/2019--11/06/2019, but no pauses -Unclear if the patient's tinazidine with mild prolonged QTc  may have played a role vs vasovagal -TSH 3.002 -Echocardiogram -Cardiology consult -Repeat BMP  Acute appendicitis -Status post laparoscopic appendectomy 11/05/2019 -Postoperative care per general surgery  Essential hypertension -Holding lisinopril secondary to soft blood pressure  Morbid obesity--class II -BMI 36.0 -Lifestyle modification  Incidental CT findings - 7 mm low-density lesion within the uncinate process of the pancreas, possibly a cyst or IPMN. Further evaluation with -outpatient nonemergent pancreatic protocol MRI     Status is: Observation  The patient remains OBS appropriate and will d/c before 2 midnights.  Dispo: The patient is from: Home              Anticipated d/c is to: Home              Anticipated d/c date is: 1 day              Patient currently is not medically stable to d/c.        Family Communication:  no Family at bedside  Consultants:  cardiology  Code Status:  FULL  DVT Prophylaxis:  SCDs   Procedures: As Listed in Progress Note Above  Antibiotics: None       Subjective: Patient complains of post-operative pain.  Denies f/c, cp, sob, n/v/d, dizziness  Objective: Vitals:   11/06/19 0700 11/06/19 0800 11/06/19 0806 11/06/19 0814  BP: 122/68 126/73    Pulse: 63 65 70   Resp: 13 12 13    Temp:    98.4 F (36.9 C)  TempSrc:      SpO2: 96% 98% 99%   Weight:      Height:        Intake/Output Summary (  Last 24 hours) at 11/06/2019 0826 Last data filed at 11/06/2019 0800 Gross per 24 hour  Intake 2837.7 ml  Output 580 ml  Net 2257.7 ml   Weight change:  Exam:   General:  Pt is alert, follows commands appropriately, not in acute distress  HEENT: No icterus, No thrush, No neck mass, [place]/AT  Cardiovascular: RRR, S1/S2, no rubs, no gallops  Respiratory: CTA bilaterally, no wheezing, no crackles, no rhonchi  Abdomen: Soft/+BS, non tender, non distended, no guarding  Extremities: No edema, No lymphangitis,  No petechiae, No rashes, no synovitis   Data Reviewed: I have personally reviewed following labs and imaging studies Basic Metabolic Panel: Recent Labs  Lab 11/05/19 1236 11/05/19 1915 11/05/19 2116  NA 141 138  --   K 3.8 3.4*  --   CL 105 108  --   CO2 26 21*  --   GLUCOSE 95 129*  --   BUN 18 15  --   CREATININE 0.78 0.80  --   CALCIUM 9.3 8.4*  --   MG  --   --  1.9   Liver Function Tests: Recent Labs  Lab 11/05/19 1236  AST 18  ALT 16  ALKPHOS 61  BILITOT 0.7  PROT 7.9  ALBUMIN 4.2   Recent Labs  Lab 11/05/19 1236  LIPASE 19   No results for input(s): AMMONIA in the last 168 hours. Coagulation Profile: No results for input(s): INR, PROTIME in the last 168 hours. CBC: Recent Labs  Lab 11/05/19 1236 11/05/19 1915  WBC 7.4 11.6*  NEUTROABS 4.6 9.2*  HGB 14.2 13.5  HCT 42.5 42.2  MCV 95.5 100.2*  PLT 217 191   Cardiac Enzymes: No results for input(s): CKTOTAL, CKMB, CKMBINDEX, TROPONINI in the last 168 hours. BNP: Invalid input(s): POCBNP CBG: No results for input(s): GLUCAP in the last 168 hours. HbA1C: No results for input(s): HGBA1C in the last 72 hours. Urine analysis:    Component Value Date/Time   COLORURINE STRAW (A) 11/05/2019 1233   APPEARANCEUR CLEAR 11/05/2019 1233   LABSPEC 1.005 11/05/2019 1233   PHURINE 5.0 11/05/2019 1233   GLUCOSEU NEGATIVE 11/05/2019 1233   HGBUR MODERATE (A) 11/05/2019 1233   BILIRUBINUR NEGATIVE 11/05/2019 1233   KETONESUR NEGATIVE 11/05/2019 1233   PROTEINUR NEGATIVE 11/05/2019 1233   NITRITE NEGATIVE 11/05/2019 1233   LEUKOCYTESUR NEGATIVE 11/05/2019 1233   Sepsis Labs: @LABRCNTIP (procalcitonin:4,lacticidven:4) ) Recent Results (from the past 240 hour(s))  Respiratory Panel by RT PCR (Flu A&B, Covid) - Nasopharyngeal Swab     Status: None   Collection Time: 11/05/19  2:33 PM   Specimen: Nasopharyngeal Swab  Result Value Ref Range Status   SARS Coronavirus 2 by RT PCR NEGATIVE NEGATIVE Final     Comment: (NOTE) SARS-CoV-2 target nucleic acids are NOT DETECTED.  The SARS-CoV-2 RNA is generally detectable in upper respiratoy specimens during the acute phase of infection. The lowest concentration of SARS-CoV-2 viral copies this assay can detect is 131 copies/mL. A negative result does not preclude SARS-Cov-2 infection and should not be used as the sole basis for treatment or other patient management decisions. A negative result may occur with  improper specimen collection/handling, submission of specimen other than nasopharyngeal swab, presence of viral mutation(s) within the areas targeted by this assay, and inadequate number of viral copies (<131 copies/mL). A negative result must be combined with clinical observations, patient history, and epidemiological information. The expected result is Negative.  Fact Sheet for Patients:  PinkCheek.be  Fact Sheet  for Healthcare Providers:  GravelBags.it  This test is no t yet approved or cleared by the [place] and  has been authorized for detection and/or diagnosis of SARS-CoV-2 by FDA under an Emergency Use Authorization (EUA). This EUA will remain  in effect (meaning this test can be used) for the duration of the COVID-19 declaration under Section 564(b)(1) of the Act, 21 U.S.C. section 360bbb-3(b)(1), unless the authorization is terminated or revoked sooner.     Influenza A by PCR NEGATIVE NEGATIVE Final   Influenza B by PCR NEGATIVE NEGATIVE Final    Comment: (NOTE) The Xpert Xpress SARS-CoV-2/FLU/RSV assay is intended as an aid in  the diagnosis of influenza from Nasopharyngeal swab specimens and  should not be used as a sole basis for treatment. Nasal washings and  aspirates are unacceptable for Xpert Xpress SARS-CoV-2/FLU/RSV  testing.  Fact Sheet for Patients: PinkCheek.be  Fact Sheet for Healthcare Providers:  GravelBags.it  This test is not yet approved or cleared by the [place] FDA and  has been authorized for detection and/or diagnosis of SARS-CoV-2 by  FDA under an Emergency Use Authorization (EUA). This EUA will remain  in effect (meaning this test can be used) for the duration of the  Covid-19 declaration under Section 564(b)(1) of the Act, 21  U.S.C. section 360bbb-3(b)(1), unless the authorization is  terminated or revoked. Performed at [facility], [address], [place], [place] [address]   MRSA PCR Screening     Status: Abnormal   Collection Time: 11/05/19  7:01 PM   Specimen: Nasal Mucosa; Nasopharyngeal  Result Value Ref Range Status   MRSA by PCR POSITIVE (A) NEGATIVE Final    Comment:        The GeneXpert MRSA Assay (FDA approved for NASAL specimens only), is one component of a comprehensive MRSA colonization surveillance program. It is not intended to diagnose MRSA infection nor to guide or monitor treatment for MRSA infections. RESULT CALLED TO, READ BACK BY AND VERIFIED WITH: R WAGONER,RN@[id number]  11/06/19 [facility] Performed at [facility], [address], [place], [place] [address]      Scheduled Meds: . Chlorhexidine Gluconate Cloth  6 each Topical Q0600  . Chlorhexidine Gluconate Cloth  6 each Topical Daily  . docusate sodium  100 mg Oral BID  . [START ON 11/07/2019] influenza vac split quadrivalent PF  0.5 mL Intramuscular Tomorrow-1000  . mupirocin ointment  1 application Nasal BID  . pantoprazole (PROTONIX) IV  40 mg Intravenous QHS  . [START ON 11/07/2019] pneumococcal 23 valent vaccine  0.5 mL Intramuscular Tomorrow-1000   Continuous Infusions: . lactated ringers      Procedures/Studies: CT ABDOMEN PELVIS W CONTRAST  Result Date: 11/05/2019 CLINICAL DATA:  Right lower quadrant abdominal pain EXAM: CT ABDOMEN AND PELVIS WITH CONTRAST TECHNIQUE: Multidetector CT imaging of the abdomen and pelvis was performed using the  standard protocol following bolus administration of intravenous contrast. CONTRAST:  186mL OMNIPAQUE IOHEXOL 300 MG/ML  SOLN COMPARISON:  None. FINDINGS: Lower chest: No acute abnormality. Hepatobiliary: Subcentimeter low-density lesion within the left hepatic lobe, too small to definitively characterize but may represent a cyst. The liver is otherwise unremarkable. Normal appearance of the gallbladder. No hyperdense gallstone. No biliary dilatation. Pancreas: There is a 7 mm low-density lesion within the uncinate process of the pancreas (series 2, image 29), possibly cyst. Pancreas appears otherwise within normal limits. No pancreatic ductal dilatation or peripancreatic inflammatory changes. Spleen: Normal in size without focal abnormality. Adrenals/Urinary Tract: 1 cm upper  pole right renal cyst. Kidneys enhance symmetrically. No stone or hydronephrosis. Unremarkable bladder. Unremarkable adrenal glands. Stomach/Bowel: Hyperenhancing appendix measuring up to 15 mm in diameter with a small amount of fluid surrounding the tip of the appendix within the low pelvis (series 6, images 49-59). No organized peritendinous heel fluid collection. No extraluminal air is seen. Stomach is within normal limits. There is a fluid filled loop of small bowel within the upper abdomen. Remaining small bowel is within normal limits. No evidence of bowel obstruction. Colon is within normal limits. Vascular/Lymphatic: Scattered aortoiliac atherosclerotic calcifications without aneurysm. No abdominopelvic lymphadenopathy. Reproductive: Status post hysterectomy. No adnexal masses. Other: No pneumoperitoneum. No abdominopelvic abscess. No abdominal wall hernia. Musculoskeletal: No acute or significant osseous findings. IMPRESSION: 1. Acute uncomplicated appendicitis. 2. There is a 7 mm low-density lesion within the uncinate process of the pancreas, possibly a cyst or IPMN. Further evaluation with nonemergent pancreatic protocol MRI is  recommended. 3. Aortic atherosclerosis.  (ICD10-I70.0). Electronically Signed   By: Davina Poke D.O.   On: 11/05/2019 14:19   DG Chest Port 1 View  Result Date: 11/05/2019 CLINICAL DATA:  Bradycardia. EXAM: PORTABLE CHEST 1 VIEW COMPARISON:  Jun 09, 2019 FINDINGS: The heart size and mediastinal contours are within normal limits. Both lungs are clear. No pneumothorax or pleural effusion is noted. The visualized skeletal structures are unremarkable. IMPRESSION: No active disease. Electronically Signed   By: Marijo Conception M.D.   On: 11/05/2019 18:34    Orson Eva, DO  [facility]  If 7PM-7AM, please contact night-coverage [email or web address] Password [email or web address] 11/06/2019, 8:26 AM   LOS: 0 days

## 2019-11-06 NOTE — Progress Notes (Addendum)
I was present with the medical student for this service. I personally verified the history of present illness, performed the physical exam, and made the plan for this encounter. I have verified the medical student's documentation and made modifications where appropriately. I have personally documented in my own words a brief history, physical, and plan below.     Doing well but having nausea and vomited some. She is not ready to go home yet. Had no further cardiac issues last night. Cards planning on monitor as outpatient.   Discussed cyst on pancreas with patient nad MRI need as outpatient.  BP (!) 141/55   Pulse 69   Temp 98.4 F (36.9 C)   Resp 15   Ht 5\' 6"  (1.676 m)   Wt 103.4 kg   LMP 04/15/2019   SpO2 98%   BMI 36.80 kg/m  NAD Soft, dermabond in place, appropriately tender  Diet as tolerated but likely has some degree of ileus given the vomiting today.  Told her to self regulate and see how it goes  Given nausea and vomiting, staying the night Rx already sent to [facility] [facility] for tomorrow dc. No further lab work unless something changes.  Appreciate assistance. Dr. Lysle Pearl seeing tomorrow.  Curlene Labrum, MD [facility], [place] [phone] (office)    [facility] Progress Note  1 Day Post-Op  Subjective: No events overnight. Pt tolerated liquids . Following solid foods at breakfast and lunch today, has had recurrent nausea and vomiting. Endorses some abdominal pain ranging from 2 to 7/10 overnight.   Objective: Vital signs in last 24 hours: Temp:  [97.5 F (36.4 C)-98.8 F (37.1 C)] 98.4 F (36.9 C) (10/22 0814) Pulse Rate:  [56-97] 64 (10/22 1100) Resp:  [10-24] 13 (10/22 1100) BP: (91-157)/(49-86) 157/69 (10/22 1004) SpO2:  [94 %-99 %] 98 % (10/22 1100) Last BM Date: 11/05/19 (pt stated)  Intake/Output from previous day: 10/21 0701 - 10/22 0700 In: 2597.7  [I.V.:200; IV Piggyback:2397.7] Out: 580 [Urine:560; Blood:20] Intake/Output this shift: Total I/O In: 240 [P.O.:240] Out: 400 [Urine:400]  General appearance: alert, cooperative and mild distress Resp: Normal WOB GI: soft, non-tender Incision/Wound: incision sites c/d/i w/ no erythema, induration, or swelling  Lab Results:  Recent Labs    11/05/19 1915 11/06/19 0802  WBC 11.6* 7.4  HGB 13.5 12.0  HCT 42.2 37.4  PLT 191 195   BMET Recent Labs    11/05/19 1915 11/06/19 0802  NA 138 139  K 3.4* 3.5  CL 108 105  CO2 21* 27  GLUCOSE 129* 91  BUN 15 14  CREATININE 0.80 0.86  CALCIUM 8.4* 7.9*   PT/INR No results for input(s): LABPROT, INR in the last 72 hours.  Studies/Results: CT ABDOMEN PELVIS W CONTRAST  Result Date: 11/05/2019 CLINICAL DATA:  Right lower quadrant abdominal pain EXAM: CT ABDOMEN AND PELVIS WITH CONTRAST TECHNIQUE: Multidetector CT imaging of the abdomen and pelvis was performed using the standard protocol following bolus administration of intravenous contrast. CONTRAST:  170mL OMNIPAQUE IOHEXOL 300 MG/ML  SOLN COMPARISON:  None. FINDINGS: Lower chest: No acute abnormality. Hepatobiliary: Subcentimeter low-density lesion within the left hepatic lobe, too small to definitively characterize but may represent a cyst. The liver is otherwise unremarkable. Normal appearance of the gallbladder. No hyperdense gallstone. No biliary dilatation. Pancreas: There is a 7 mm low-density lesion within the uncinate process of the pancreas (series 2, image 29), possibly cyst. Pancreas appears otherwise within normal limits.  No pancreatic ductal dilatation or peripancreatic inflammatory changes. Spleen: Normal in size without focal abnormality. Adrenals/Urinary Tract: 1 cm upper pole right renal cyst. Kidneys enhance symmetrically. No stone or hydronephrosis. Unremarkable bladder. Unremarkable adrenal glands. Stomach/Bowel: Hyperenhancing appendix measuring up to 15 mm in  diameter with a small amount of fluid surrounding the tip of the appendix within the low pelvis (series 6, images 49-59). No organized peritendinous heel fluid collection. No extraluminal air is seen. Stomach is within normal limits. There is a fluid filled loop of small bowel within the upper abdomen. Remaining small bowel is within normal limits. No evidence of bowel obstruction. Colon is within normal limits. Vascular/Lymphatic: Scattered aortoiliac atherosclerotic calcifications without aneurysm. No abdominopelvic lymphadenopathy. Reproductive: Status post hysterectomy. No adnexal masses. Other: No pneumoperitoneum. No abdominopelvic abscess. No abdominal wall hernia. Musculoskeletal: No acute or significant osseous findings. IMPRESSION: 1. Acute uncomplicated appendicitis. 2. There is a 7 mm low-density lesion within the uncinate process of the pancreas, possibly a cyst or IPMN. Further evaluation with nonemergent pancreatic protocol MRI is recommended. 3. Aortic atherosclerosis.  (ICD10-I70.0). Electronically Signed   By: Davina Poke D.O.   On: 11/05/2019 14:19   DG Chest Port 1 View  Result Date: 11/05/2019 CLINICAL DATA:  Bradycardia. EXAM: PORTABLE CHEST 1 VIEW COMPARISON:  Jun 09, 2019 FINDINGS: The heart size and mediastinal contours are within normal limits. Both lungs are clear. No pneumothorax or pleural effusion is noted. The visualized skeletal structures are unremarkable. IMPRESSION: No active disease. Electronically Signed   By: Marijo Conception M.D.   On: 11/05/2019 18:34   ECHOCARDIOGRAM COMPLETE  Result Date: 11/06/2019    ECHOCARDIOGRAM REPORT   Patient Name:   Kara Jimenez Date of Exam: 11/06/2019 Medical Rec #:  [id number]        Height:       66.0 in Accession #:    [id number]       Weight:       228.0 lb Date of Birth:  [birth year]        BSA:          2.114 m Patient Age:    59 years         BP:           157/69 mmHg Patient Gender: F                HR:           68 bpm.  Exam Location:  [facility] Procedure: 2D Echo Indications:    Bradycardia [[id number]  History:        Patient has no prior history of Echocardiogram examinations.                 Risk Factors:Non-Smoker. Acute appendicitis, Bradycardia.  Sonographer:    Leavy Cella RDCS (AE) Referring Phys: [place]  1. Left ventricular ejection fraction, by estimation, is 60 to 65%. The left ventricle has normal function. The left ventricle has no regional wall motion abnormalities. Left ventricular diastolic parameters were normal.  2. Right ventricular systolic function is normal. The right ventricular size is normal.  3. The mitral valve is normal in structure. Trivial mitral valve regurgitation. No evidence of mitral stenosis.  4. The aortic valve is tricuspid. Aortic valve regurgitation is not visualized. No aortic stenosis is present.  5. The inferior vena cava is normal in size with greater than 50% respiratory variability, suggesting right atrial pressure of 3 mmHg. FINDINGS  Left Ventricle: Left ventricular ejection fraction, by estimation, is 60 to 65%. The left ventricle has normal function. The left ventricle has no regional wall motion abnormalities. The left ventricular internal cavity size was normal in size. There is  no left ventricular hypertrophy. Left ventricular diastolic parameters were normal. Right Ventricle: The right ventricular size is normal. No increase in right ventricular wall thickness. Right ventricular systolic function is normal. Left Atrium: Left atrial size was normal in size. Right Atrium: Right atrial size was normal in size. Pericardium: There is no evidence of pericardial effusion. Mitral Valve: The mitral valve is normal in structure. Trivial mitral valve regurgitation. No evidence of mitral valve stenosis. Tricuspid Valve: The tricuspid valve is normal in structure. Tricuspid valve regurgitation is trivial. No evidence of tricuspid stenosis. Aortic Valve: The  aortic valve is tricuspid. Aortic valve regurgitation is not visualized. No aortic stenosis is present. Pulmonic Valve: The pulmonic valve was not well visualized. Pulmonic valve regurgitation is not visualized. No evidence of pulmonic stenosis. Aorta: The aortic root is normal in size and structure. Pulmonary Artery: Indeterminant PASP, inadequate TR jet. Venous: The inferior vena cava is normal in size with greater than 50% respiratory variability, suggesting right atrial pressure of 3 mmHg. IAS/Shunts: No atrial level shunt detected by color flow Doppler.  LEFT VENTRICLE PLAX 2D LVIDd:         3.82 cm  Diastology LVIDs:         2.13 cm  LV e' medial:    8.16 cm/s LV PW:         1.00 cm  LV E/e' medial:  12.5 LV IVS:        1.00 cm  LV e' lateral:   9.79 cm/s LVOT diam:     1.80 cm  LV E/e' lateral: 10.4 LV SV:         69 LV SV Index:   33 LVOT Area:     2.54 cm  RIGHT VENTRICLE RV S prime:     11.20 cm/s TAPSE (M-mode): 2.0 cm LEFT ATRIUM             Index       RIGHT ATRIUM          Index LA diam:        3.80 cm 1.80 cm/m  RA Area:     9.82 cm LA Vol (A2C):   42.2 ml 19.96 ml/m RA Volume:   18.30 ml 8.66 ml/m LA Vol (A4C):   24.9 ml 11.78 ml/m LA Biplane Vol: 33.8 ml 15.99 ml/m  AORTIC VALVE LVOT Vmax:   135.65 cm/s LVOT Vmean:  88.805 cm/s LVOT VTI:    0.272 m  AORTA Ao Root diam: 2.00 cm MITRAL VALVE MV Area (PHT): 2.74 cm     SHUNTS MV Decel Time: 277 msec     Systemic VTI:  0.27 m MV E velocity: 102.00 cm/s  Systemic Diam: 1.80 cm MV A velocity: 75.80 cm/s MV E/A ratio:  1.35 Carlyle Dolly MD Electronically signed by Carlyle Dolly MD Signature Date/Time: 11/06/2019/1:13:01 PM    Final     Anti-infectives: Anti-infectives (From admission, onward)   Start     Dose/Rate Route Frequency Ordered Stop   11/06/19 0600  cefoTEtan (CEFOTAN) 2 g in sodium chloride 0.9 % 100 mL IVPB        2 g 200 mL/hr over 30 Minutes Intravenous On call to O.R. 11/05/19 1532 11/05/19 1659       Assessment/Plan: s/p  Procedure(s): APPENDECTOMY LAPAROSCOPIC   LOS: 0 days   Pt is 59 YO woman on post-op day 1 following laparoscopic appendectomy w/ minute-long episode of bradycardia to 20s and no overnight events. Physical exam and labs reassuring. Pt cleared by cardiology for d/c w/ home cardiac monitoring.    Thompson for discharge from surgical perspective, once pt's nausea and vomiting is better controlled.  Plan as follows: - Meds PRN for pain control - Zofran and phenergan for nausea/vom - Zio 7 day heart monitor per Dr. Harl Bowie - Advance diet as tolerated - SCDs and LMWH for DVT PPX  Raliegh Ip 11/06/2019

## 2019-11-06 NOTE — Progress Notes (Signed)
*  PRELIMINARY RESULTS* Echocardiogram 2D Echocardiogram has been performed.  Kara Jimenez 11/06/2019, 11:35 AM

## 2019-11-06 NOTE — Anesthesia Postprocedure Evaluation (Signed)
Anesthesia Post Note  Patient: Kara Jimenez  Procedure(s) Performed: APPENDECTOMY LAPAROSCOPIC (N/A Abdomen)  Patient location during evaluation: ICU Anesthesia Type: General Level of consciousness: awake Pain management: pain level controlled Vital Signs Assessment: post-procedure vital signs reviewed and stable Respiratory status: spontaneous breathing Cardiovascular status: blood pressure returned to baseline Anesthetic complications: no Comments: Patient is being followed and evaluated for bradycardic episode in OR.  It was treated effectively with no negative consequences.  Pt was extubated and transferred in stable condition to the ICU for monitoring.   No complications documented.   Last Vitals:  Vitals:   11/06/19 0900 11/06/19 1004  BP:  (!) 157/69  Pulse: 82 68  Resp: 14 17  Temp:    SpO2: 99% 97%    Last Pain:  Vitals:   11/06/19 1033  TempSrc:   PainSc: [place]

## 2019-11-06 NOTE — Consult Note (Signed)
Cardiology Consultation:   Patient ID: Kara Jimenez MRN: [id number]; DOB: 02-25-1960  Admit date: 11/05/2019 Date of Consult: 11/06/2019  Primary Care Provider: Alfonse Flavors, MD [facility] Cardiologist: New, Dr [facility]isc[facility] Electrophysiologist:  None    Patient Profile:   KERENSA Jimenez is a 59 y.o. female with a hx of fibromyalgia, sleep apnea, obesity   who is being seen today for the evaluation of bradycardia at the request of Dr Tat.  History of Present Illness:   Ms. Kara Jimenez 59 yo female history of fibromyalgia, sleep apnea, obesity admitted with abdominal pain. Diagnosed with appendicitis this admission, s/p laprascopic appendectomy on 11/05/19. During surgery had brady to 57s according to notes, received atropine with good response without recurrence.   She denies any episodes of syncope. Some nonspecifici episodes of dizziness without clear triggers, was more of an issue in the past, fairly rare sympomts recently.    WBC 7.4 Hgb 14.2 Plt 217 K 3.8 Cr 0.78 BUN 18 TSH 3 Mg 1.9 COVID neg Trop 3-->4 CXR no acute process    Past Medical History:  Diagnosis Date  . Arthritis   . Breast injury   . Carpal tunnel syndrome   . Fibromyalgia   . Sciatica   . Sleep apnea     Past Surgical History:  Procedure Laterality Date  . BREAST EXCISIONAL BIOPSY Right    benign  . CARPAL TUNNEL RELEASE Bilateral   . CESAREAN SECTION    . LAPAROSCOPIC APPENDECTOMY N/A 11/05/2019   Procedure: APPENDECTOMY LAPAROSCOPIC;  Surgeon: Virl Cagey, MD;  Location: [facility];  Service: General;  Laterality: N/A;  . NECK SURGERY    . PARTIAL HYSTERECTOMY    . ROTATOR CUFF REPAIR Left   . TUBAL LIGATION        Inpatient Medications: Scheduled Meds: . Chlorhexidine Gluconate Cloth  6 each Topical Q0600  . Chlorhexidine Gluconate Cloth  6 each Topical Daily  . docusate sodium  100 mg Oral BID  . [START ON 11/07/2019] influenza vac split quadrivalent PF   0.5 mL Intramuscular Tomorrow-1000  . mupirocin ointment  1 application Nasal BID  . pantoprazole (PROTONIX) IV  40 mg Intravenous QHS  . [START ON 11/07/2019] pneumococcal 23 valent vaccine  0.5 mL Intramuscular Tomorrow-1000   Continuous Infusions:  PRN Meds: acetaminophen **OR** acetaminophen, diphenhydrAMINE **OR** diphenhydrAMINE, metoprolol tartrate, morphine injection, ondansetron **OR** ondansetron (ZOFRAN) IV, oxyCODONE, simethicone  Allergies:    Allergies  Allergen Reactions  . Amitriptyline Shortness Of Breath  . Duloxetine Shortness Of Breath  . Gabapentin Shortness Of Breath  . Pregabalin Shortness Of Breath  . Sertraline Shortness Of Breath  . Hydrochlorothiazide Nausea And Vomiting  . Baclofen   . Ibuprofen   . Prednisone     Emesis   . Amlodipine Rash    Social History:   Social History   Socioeconomic History  . Marital status: Single    Spouse name: Not on file  . Number of children: Not on file  . Years of education: Not on file  . Highest education level: Not on file  Occupational History  . Not on file  Tobacco Use  . Smoking status: Never Smoker  . Smokeless tobacco: Never Used  Substance and Sexual Activity  . Alcohol use: Not Currently  . Drug use: Yes    Types: Marijuana  . Sexual activity: Not on file  Other Topics Concern  . Not on file  Social History Narrative  . Not on  file   Social Determinants of Health   Financial Resource Strain:   . Difficulty of Paying Living Expenses: Not on file  Food Insecurity:   . Worried About Charity fundraiser in the Last Year: Not on file  . Ran Out of Food in the Last Year: Not on file  Transportation Needs:   . Lack of Transportation (Medical): Not on file  . Lack of Transportation (Non-Medical): Not on file  Physical Activity:   . Days of Exercise per Week: Not on file  . Minutes of Exercise per Session: Not on file  Stress:   . Feeling of Stress : Not on file  Social Connections:   .  Frequency of Communication with Friends and Family: Not on file  . Frequency of Social Gatherings with Friends and Family: Not on file  . Attends Religious Services: Not on file  . Active Member of Clubs or Organizations: Not on file  . Attends Archivist Meetings: Not on file  . Marital Status: Not on file  Intimate Partner Violence:   . Fear of Current or Ex-Partner: Not on file  . Emotionally Abused: Not on file  . Physically Abused: Not on file  . Sexually Abused: Not on file    Family History:    Family History  Problem Relation Age of Onset  . HIV/AIDS Mother   . Hypertension Father      ROS:  Please see the history of present illness.   All other ROS reviewed and negative.     Physical Exam/Data:   Vitals:   11/06/19 0800 11/06/19 0806 11/06/19 0814 11/06/19 0900  BP: 126/73     Pulse: 65 70  82  Resp: 12 13  14   Temp:   98.4 F (36.9 C)   TempSrc:      SpO2: 98% 99%  99%  Weight:      Height:        Intake/Output Summary (Last 24 hours) at 11/06/2019 0928 Last data filed at 11/06/2019 0800 Gross per 24 hour  Intake 2837.7 ml  Output 580 ml  Net 2257.7 ml   Last 3 Weights 11/05/2019 06/09/2019 04/21/2019  Weight (lbs) 228 lb 230 lb 226 lb  Weight (kg) 103.42 kg 104.327 kg 102.513 kg     Body mass index is 36.8 kg/m.  General:  Well nourished, well developed, in no acute distress HEENT: normal Lymph: no adenopathy Neck: no JVD Endocrine:  No thryomegaly Vascular: No carotid bruits; FA pulses 2+ bilaterally without bruits  Cardiac:  normal S1, S2; RRR; no murmur  Lungs:  clear to auscultation bilaterally, no wheezing, rhonchi or rales  Abd: soft, nontender, no hepatomegaly  Ext: no edema Musculoskeletal:  No deformities, BUE and BLE strength normal and equal Skin: warm and dry  Neuro:  CNs 2-12 intact, no focal abnormalities noted Psych:  Normal affect    Laboratory Data:  High Sensitivity Troponin:   Recent Labs  Lab 11/05/19 1915  11/05/19 2116  TROPONINIHS 3 4     Chemistry Recent Labs  Lab 11/05/19 1236 11/05/19 1915 11/06/19 0802  NA 141 138 139  K 3.8 3.4* 3.5  CL 105 108 105  CO2 26 21* 27  GLUCOSE 95 129* 91  BUN 18 15 14   CREATININE 0.78 0.80 0.86  CALCIUM 9.3 8.4* 7.9*  GFRNONAA >60 >60 >60  ANIONGAP 10 9 7     Recent Labs  Lab 11/05/19 1236  PROT 7.9  ALBUMIN 4.2  AST  18  ALT 16  ALKPHOS 61  BILITOT 0.7   Hematology Recent Labs  Lab 11/05/19 1236 11/05/19 1915 11/06/19 0802  WBC 7.4 11.6* 7.4  RBC 4.45 4.21 3.79*  HGB 14.2 13.5 12.0  HCT 42.5 42.2 37.4  MCV 95.5 100.2* 98.7  MCH 31.9 32.1 31.7  MCHC 33.4 32.0 32.1  RDW 12.7 12.8 13.2  PLT 217 191 195   BNPNo results for input(s): BNP, PROBNP in the last 168 hours.  DDimer No results for input(s): DDIMER in the last 168 hours.   Radiology/Studies:  CT ABDOMEN PELVIS W CONTRAST  Result Date: 11/05/2019 CLINICAL DATA:  Right lower quadrant abdominal pain EXAM: CT ABDOMEN AND PELVIS WITH CONTRAST TECHNIQUE: Multidetector CT imaging of the abdomen and pelvis was performed using the standard protocol following bolus administration of intravenous contrast. CONTRAST:  148mL OMNIPAQUE IOHEXOL 300 MG/ML  SOLN COMPARISON:  None. FINDINGS: Lower chest: No acute abnormality. Hepatobiliary: Subcentimeter low-density lesion within the left hepatic lobe, too small to definitively characterize but may represent a cyst. The liver is otherwise unremarkable. Normal appearance of the gallbladder. No hyperdense gallstone. No biliary dilatation. Pancreas: There is a 7 mm low-density lesion within the uncinate process of the pancreas (series 2, image 29), possibly cyst. Pancreas appears otherwise within normal limits. No pancreatic ductal dilatation or peripancreatic inflammatory changes. Spleen: Normal in size without focal abnormality. Adrenals/Urinary Tract: 1 cm upper pole right renal cyst. Kidneys enhance symmetrically. No stone or hydronephrosis.  Unremarkable bladder. Unremarkable adrenal glands. Stomach/Bowel: Hyperenhancing appendix measuring up to 15 mm in diameter with a small amount of fluid surrounding the tip of the appendix within the low pelvis (series 6, images 49-59). No organized peritendinous heel fluid collection. No extraluminal air is seen. Stomach is within normal limits. There is a fluid filled loop of small bowel within the upper abdomen. Remaining small bowel is within normal limits. No evidence of bowel obstruction. Colon is within normal limits. Vascular/Lymphatic: Scattered aortoiliac atherosclerotic calcifications without aneurysm. No abdominopelvic lymphadenopathy. Reproductive: Status post hysterectomy. No adnexal masses. Other: No pneumoperitoneum. No abdominopelvic abscess. No abdominal wall hernia. Musculoskeletal: No acute or significant osseous findings. IMPRESSION: 1. Acute uncomplicated appendicitis. 2. There is a 7 mm low-density lesion within the uncinate process of the pancreas, possibly a cyst or IPMN. Further evaluation with nonemergent pancreatic protocol MRI is recommended. 3. Aortic atherosclerosis.  (ICD10-I70.0). Electronically Signed   By: Davina Poke D.O.   On: 11/05/2019 14:19   DG Chest Port 1 View  Result Date: 11/05/2019 CLINICAL DATA:  Bradycardia. EXAM: PORTABLE CHEST 1 VIEW COMPARISON:  Jun 09, 2019 FINDINGS: The heart size and mediastinal contours are within normal limits. Both lungs are clear. No pneumothorax or pleural effusion is noted. The visualized skeletal structures are unremarkable. IMPRESSION: No active disease. Electronically Signed   By: Marijo Conception M.D.   On: 11/05/2019 18:34     Assessment and Plan:   1. Bradycardia - episode of bradycardia to 20s during laprascopic surgery, responded to atropine without recurrence. Baseline EKG mild first degree AV block, SR normal rate. Manual QTc is 450 which is reasonable for her. SHe is on tizanadine at home.  - TSH 3, normal  electrolytes Anesthesia meds: propofol, succinylcholine, neostigmine reportedly given during procedure and all with some assocaition with bradycardia. Has been on tizaidine at  Home (alpha 2 agonist)   - suspect isolated bradycardia from either vagal response +/- reaction to anesthesia. No significant recurrence overnight, nocturnal sinus brady to high  40s would be considered benign. Nonspecific dizziness at home at times.  - will obtain 7 day zio patch for further evaluate before discharge   Likely discharge later today one zio patch placed, can f/u echo that has been ordered.       For questions or updates, please contact [place] Please consult [email or web address] for contact info under    Signed, Carlyle Dolly, MD  11/06/2019 9:28 AM

## 2019-11-06 NOTE — Progress Notes (Signed)
Zio patch placed to wear home. Echo completed which is normal. Cardiology will sign off inpatient care, we will arrange f/u. [place] for discharge from our standpoint. Can continue home tizanadine if remains medically indicated   Carlyle Dolly MD

## 2019-11-07 DIAGNOSIS — K869 Disease of pancreas, unspecified: Secondary | ICD-10-CM | POA: Diagnosis not present

## 2019-11-07 DIAGNOSIS — E669 Obesity, unspecified: Secondary | ICD-10-CM | POA: Diagnosis not present

## 2019-11-07 DIAGNOSIS — K3580 Unspecified acute appendicitis: Secondary | ICD-10-CM | POA: Diagnosis not present

## 2019-11-07 DIAGNOSIS — R001 Bradycardia, unspecified: Secondary | ICD-10-CM | POA: Diagnosis not present

## 2019-11-07 LAB — BASIC METABOLIC PANEL
Anion gap: 9 (ref 5–15)
BUN: 14 mg/dL (ref 6–20)
CO2: 26 mmol/L (ref 22–32)
Calcium: 8.3 mg/dL — ABNORMAL LOW (ref 8.9–10.3)
Chloride: 106 mmol/L (ref 98–111)
Creatinine, Ser: 0.85 mg/dL (ref 0.44–1.00)
GFR, Estimated: >60 mL/min (ref >60)
Glucose, Bld: 95 mg/dL (ref 70–99)
Potassium: 3.4 mmol/L — ABNORMAL LOW (ref 3.5–5.1)
Sodium: 141 mmol/L (ref 135–145)

## 2019-11-07 LAB — MAGNESIUM: Magnesium: 2.1 mg/dL (ref 1.7–2.4)

## 2019-11-07 LAB — PHOSPHORUS: Phosphorus: 3.1 mg/dL (ref 2.5–4.6)

## 2019-11-07 NOTE — Progress Notes (Signed)
PROGRESS NOTE  Kara Jimenez JGO:[id number] DOB: 1960-05-11 DOA: 11/05/2019 PCP: Alfonse Flavors, MD  Brief History:  59 year old female with a history of OSA, fibromyalgia, and hypertension presenting with 3-day history of nausea, vomiting, and abdominal pain.  In the emergency department, CT of the abdomen and pelvis showed a hyperenhancing appendix up to 50 mm with a small amount of fluid at the tip suggestive of acute appendicitis.  There is also a 7 mm low-density lesion in the uncinate process of the pancreas.  General surgery was consulted.  Dr. Constance Haw took the patient to the operating room and performed a laparoscopic appendectomy on 11/05/2019.  Intraoperatively, the patient had bradycardia into the 20s with hypotension with a pressure of 70/40.  The patient was given atropine with improvement of her heart rate back to 70-90 as well as improvement of her blood pressure.  In the postoperative period, the patient did not have any further bradycardia. In speaking with the patient, the patient denies any previous history of bradycardia or cardiac issues.  She denied any recent palpitations, chest pain, shortness breath, dizziness, syncope. She stated that she was started on new medication, tinazidine, approximately 1 week prior to this hospital admission.  The patient uses marijuana multiple times per week, but denies any tobacco alcohol or other illicit substances.  The patient was admitted overnight and monitored on telemetry. Her preoperative EKG showed sinus rhythm with first-degree AV block and QTC 453.  In reviewing her medical record, the patient has an EKG on 06/09/2019 which showed QTC of 435 with sinus rhythm and PR interval 202.  Assessment/Plan: Bradycardia -Treated with atropine intraoperatively -The patient had some mild nocturnal bradycardia (upper 40s) overnight 11/05/2019--11/06/2019,  And 10/22-10/23 but no pauses -Unclear if the patient's tinazidine with  mild prolonged QTc may have played a role vs vasovagal vs anesthesia/hypnotic agents -TSH 3.002 -Echocardiogram--EF 60-65%, no WMA -Cardiology consult-->ZIO patch x 7 days   Acute appendicitis -Status post laparoscopic appendectomy 11/05/2019 -Postoperative care per general surgery  Essential hypertension -Holding lisinopril secondary to soft blood pressure-->restart after d/c  Morbid obesity--class II -BMI 36.0 -Lifestyle modification  Incidental CT findings -7 mm low-density lesion within the uncinate process of the pancreas, possibly a cyst or IPMN. Further evaluation with -outpatient nonemergent pancreatic protocol MRI       The patient remains OBS appropriate and will d/c before 2 midnights.  Dispo: The patient is from: Home  Anticipated d/c is to: Home  Anticipated d/c date is: 1 day  Patient currently is not medically stable to d/c.        Family Communication:  no Family at bedside  Consultants:  cardiology  Code Status:  FULL  DVT Prophylaxis:  SCDs   Procedures: As Listed in Progress Note Above  Antibiotics: None        Subjective: Patient states n/v are improved.  Denies f/c, cp, sob.  Abd pain is slowly improving  Objective: Vitals:   11/07/19 0700 11/07/19 0758 11/07/19 0800 11/07/19 0900  BP: (!) 162/73  (!) 151/68 (!) 178/77  Pulse: 64 65 63 78  Resp: 12 20 12 18   Temp:  98.2 F (36.8 C)    TempSrc:  Oral    SpO2: 99% 99% 98% 99%  Weight:      Height:        Intake/Output Summary (Last 24 hours) at 11/07/2019 1002 Last data filed at 11/07/2019 0200 Gross per 24 hour  Intake 480 ml  Output 1400 ml  Net -920 ml   Weight change: 1.78 kg Exam:   General:  Pt is alert, follows commands appropriately, not in acute distress  HEENT: No icterus, No thrush, No neck mass, [place]/AT  Cardiovascular: RRR, S1/S2, no rubs, no gallops  Respiratory: CTA bilaterally, no  wheezing, no crackles, no rhonchi  Abdomen: Soft/+BS, non tender, non distended, no guarding  Extremities: No edema, No lymphangitis, No petechiae, No rashes, no synovitis   Data Reviewed: I have personally reviewed following labs and imaging studies Basic Metabolic Panel: Recent Labs  Lab 11/05/19 1236 11/05/19 1915 11/05/19 2116 11/06/19 0802 11/07/19 0451  NA 141 138  --  139 141  K 3.8 3.4*  --  3.5 3.4*  CL 105 108  --  105 106  CO2 26 21*  --  27 26  GLUCOSE 95 129*  --  91 95  BUN 18 15  --  14 14  CREATININE 0.78 0.80  --  0.86 0.85  CALCIUM 9.3 8.4*  --  7.9* 8.3*  MG  --   --  1.9 1.9 2.1  PHOS  --   --   --   --  3.1   Liver Function Tests: Recent Labs  Lab 11/05/19 1236  AST 18  ALT 16  ALKPHOS 61  BILITOT 0.7  PROT 7.9  ALBUMIN 4.2   Recent Labs  Lab 11/05/19 1236  LIPASE 19   No results for input(s): AMMONIA in the last 168 hours. Coagulation Profile: No results for input(s): INR, PROTIME in the last 168 hours. CBC: Recent Labs  Lab 11/05/19 1236 11/05/19 1915 11/06/19 0802  WBC 7.4 11.6* 7.4  NEUTROABS 4.6 9.2* 4.8  HGB 14.2 13.5 12.0  HCT 42.5 42.2 37.4  MCV 95.5 100.2* 98.7  PLT 217 191 195   Cardiac Enzymes: No results for input(s): CKTOTAL, CKMB, CKMBINDEX, TROPONINI in the last 168 hours. BNP: Invalid input(s): POCBNP CBG: No results for input(s): GLUCAP in the last 168 hours. HbA1C: No results for input(s): HGBA1C in the last 72 hours. Urine analysis:    Component Value Date/Time   COLORURINE STRAW (A) 11/05/2019 1233   APPEARANCEUR CLEAR 11/05/2019 1233   LABSPEC 1.005 11/05/2019 1233   PHURINE 5.0 11/05/2019 1233   GLUCOSEU NEGATIVE 11/05/2019 1233   HGBUR MODERATE (A) 11/05/2019 1233   BILIRUBINUR NEGATIVE 11/05/2019 1233   KETONESUR NEGATIVE 11/05/2019 1233   PROTEINUR NEGATIVE 11/05/2019 1233   NITRITE NEGATIVE 11/05/2019 1233   LEUKOCYTESUR NEGATIVE 11/05/2019 1233   Sepsis  Labs: @LABRCNTIP (procalcitonin:4,lacticidven:4) ) Recent Results (from the past 240 hour(s))  Respiratory Panel by RT PCR (Flu A&B, Covid) - Nasopharyngeal Swab     Status: None   Collection Time: 11/05/19  2:33 PM   Specimen: Nasopharyngeal Swab  Result Value Ref Range Status   SARS Coronavirus 2 by RT PCR NEGATIVE NEGATIVE Final    Comment: (NOTE) SARS-CoV-2 target nucleic acids are NOT DETECTED.  The SARS-CoV-2 RNA is generally detectable in upper respiratoy specimens during the acute phase of infection. The lowest concentration of SARS-CoV-2 viral copies this assay can detect is 131 copies/mL. A negative result does not preclude SARS-Cov-2 infection and should not be used as the sole basis for treatment or other patient management decisions. A negative result may occur with  improper specimen collection/handling, submission of specimen other than nasopharyngeal swab, presence of viral mutation(s) within the areas targeted by this assay, and inadequate number of viral copies (<131 copies/mL).  A negative result must be combined with clinical observations, patient history, and epidemiological information. The expected result is Negative.  Fact Sheet for Patients:  PinkCheek.be  Fact Sheet for Healthcare Providers:  GravelBags.it  This test is no t yet approved or cleared by the [place] FDA and  has been authorized for detection and/or diagnosis of SARS-CoV-2 by FDA under an Emergency Use Authorization (EUA). This EUA will remain  in effect (meaning this test can be used) for the duration of the COVID-19 declaration under Section 564(b)(1) of the Act, 21 U.S.C. section 360bbb-3(b)(1), unless the authorization is terminated or revoked sooner.     Influenza A by PCR NEGATIVE NEGATIVE Final   Influenza B by PCR NEGATIVE NEGATIVE Final    Comment: (NOTE) The Xpert Xpress SARS-CoV-2/FLU/RSV assay is intended as an  aid in  the diagnosis of influenza from Nasopharyngeal swab specimens and  should not be used as a sole basis for treatment. Nasal washings and  aspirates are unacceptable for Xpert Xpress SARS-CoV-2/FLU/RSV  testing.  Fact Sheet for Patients: PinkCheek.be  Fact Sheet for Healthcare Providers: GravelBags.it  This test is not yet approved or cleared by the [place] FDA and  has been authorized for detection and/or diagnosis of SARS-CoV-2 by  FDA under an Emergency Use Authorization (EUA). This EUA will remain  in effect (meaning this test can be used) for the duration of the  Covid-19 declaration under Section 564(b)(1) of the Act, 21  U.S.C. section 360bbb-3(b)(1), unless the authorization is  terminated or revoked. Performed at [facility], [address], [place], [place] [address]   MRSA PCR Screening     Status: Abnormal   Collection Time: 11/05/19  7:01 PM   Specimen: Nasal Mucosa; Nasopharyngeal  Result Value Ref Range Status   MRSA by PCR POSITIVE (A) NEGATIVE Final    Comment:        The GeneXpert MRSA Assay (FDA approved for NASAL specimens only), is one component of a comprehensive MRSA colonization surveillance program. It is not intended to diagnose MRSA infection nor to guide or monitor treatment for MRSA infections. RESULT CALLED TO, READ BACK BY AND VERIFIED WITH: R WAGONER,RN@[id number]  11/06/19 [facility] Performed at [facility], [address] [place], [place] [address]      Scheduled Meds: . Chlorhexidine Gluconate Cloth  6 each Topical Q0600  . Chlorhexidine Gluconate Cloth  6 each Topical Daily  . docusate sodium  100 mg Oral BID  . enoxaparin (LOVENOX) injection  40 mg Subcutaneous Q24H  . influenza vac split quadrivalent PF  0.5 mL Intramuscular Tomorrow-1000  . mupirocin ointment  1 application Nasal BID  . pantoprazole (PROTONIX) IV  40 mg Intravenous QHS  . pneumococcal 23 valent vaccine   0.5 mL Intramuscular Tomorrow-1000   Continuous Infusions:  Procedures/Studies: CT ABDOMEN PELVIS W CONTRAST  Result Date: 11/05/2019 CLINICAL DATA:  Right lower quadrant abdominal pain EXAM: CT ABDOMEN AND PELVIS WITH CONTRAST TECHNIQUE: Multidetector CT imaging of the abdomen and pelvis was performed using the standard protocol following bolus administration of intravenous contrast. CONTRAST:  13mL OMNIPAQUE IOHEXOL 300 MG/ML  SOLN COMPARISON:  None. FINDINGS: Lower chest: No acute abnormality. Hepatobiliary: Subcentimeter low-density lesion within the left hepatic lobe, too small to definitively characterize but may represent a cyst. The liver is otherwise unremarkable. Normal appearance of the gallbladder. No hyperdense gallstone. No biliary dilatation. Pancreas: There is a 7 mm low-density lesion within the uncinate process of the pancreas (series 2, image 29), possibly cyst.  Pancreas appears otherwise within normal limits. No pancreatic ductal dilatation or peripancreatic inflammatory changes. Spleen: Normal in size without focal abnormality. Adrenals/Urinary Tract: 1 cm upper pole right renal cyst. Kidneys enhance symmetrically. No stone or hydronephrosis. Unremarkable bladder. Unremarkable adrenal glands. Stomach/Bowel: Hyperenhancing appendix measuring up to 15 mm in diameter with a small amount of fluid surrounding the tip of the appendix within the low pelvis (series 6, images 49-59). No organized peritendinous heel fluid collection. No extraluminal air is seen. Stomach is within normal limits. There is a fluid filled loop of small bowel within the upper abdomen. Remaining small bowel is within normal limits. No evidence of bowel obstruction. Colon is within normal limits. Vascular/Lymphatic: Scattered aortoiliac atherosclerotic calcifications without aneurysm. No abdominopelvic lymphadenopathy. Reproductive: Status post hysterectomy. No adnexal masses. Other: No pneumoperitoneum. No  abdominopelvic abscess. No abdominal wall hernia. Musculoskeletal: No acute or significant osseous findings. IMPRESSION: 1. Acute uncomplicated appendicitis. 2. There is a 7 mm low-density lesion within the uncinate process of the pancreas, possibly a cyst or IPMN. Further evaluation with nonemergent pancreatic protocol MRI is recommended. 3. Aortic atherosclerosis.  (ICD10-I70.0). Electronically Signed   By: Davina Poke D.O.   On: 11/05/2019 14:19   DG Chest Port 1 View  Result Date: 11/05/2019 CLINICAL DATA:  Bradycardia. EXAM: PORTABLE CHEST 1 VIEW COMPARISON:  Jun 09, 2019 FINDINGS: The heart size and mediastinal contours are within normal limits. Both lungs are clear. No pneumothorax or pleural effusion is noted. The visualized skeletal structures are unremarkable. IMPRESSION: No active disease. Electronically Signed   By: Marijo Conception M.D.   On: 11/05/2019 18:34   ECHOCARDIOGRAM COMPLETE  Result Date: 11/06/2019    ECHOCARDIOGRAM REPORT   Patient Name:   [name] Date of Exam: 11/06/2019 Medical Rec #:  [id number]        Height:       66.0 in Accession #:    [id number]       Weight:       228.0 lb Date of Birth:  April 19, 1960        BSA:          2.114 m Patient Age:    8 years         BP:           157/69 mmHg Patient Gender: F                HR:           68 bpm. Exam Location:  [facility] Procedure: 2D Echo Indications:    Bradycardia [[id number]  History:        Patient has no prior history of Echocardiogram examinations.                 Risk Factors:Non-Smoker. Acute appendicitis, Bradycardia.  Sonographer:    Leavy Cella RDCS (AE) Referring Phys: [place]  1. Left ventricular ejection fraction, by estimation, is 60 to 65%. The left ventricle has normal function. The left ventricle has no regional wall motion abnormalities. Left ventricular diastolic parameters were normal.  2. Right ventricular systolic function is normal. The right ventricular size is  normal.  3. The mitral valve is normal in structure. Trivial mitral valve regurgitation. No evidence of mitral stenosis.  4. The aortic valve is tricuspid. Aortic valve regurgitation is not visualized. No aortic stenosis is present.  5. The inferior vena cava is normal in size with greater than 50% respiratory variability, suggesting right atrial  pressure of 3 mmHg. FINDINGS  Left Ventricle: Left ventricular ejection fraction, by estimation, is 60 to 65%. The left ventricle has normal function. The left ventricle has no regional wall motion abnormalities. The left ventricular internal cavity size was normal in size. There is  no left ventricular hypertrophy. Left ventricular diastolic parameters were normal. Right Ventricle: The right ventricular size is normal. No increase in right ventricular wall thickness. Right ventricular systolic function is normal. Left Atrium: Left atrial size was normal in size. Right Atrium: Right atrial size was normal in size. Pericardium: There is no evidence of pericardial effusion. Mitral Valve: The mitral valve is normal in structure. Trivial mitral valve regurgitation. No evidence of mitral valve stenosis. Tricuspid Valve: The tricuspid valve is normal in structure. Tricuspid valve regurgitation is trivial. No evidence of tricuspid stenosis. Aortic Valve: The aortic valve is tricuspid. Aortic valve regurgitation is not visualized. No aortic stenosis is present. Pulmonic Valve: The pulmonic valve was not well visualized. Pulmonic valve regurgitation is not visualized. No evidence of pulmonic stenosis. Aorta: The aortic root is normal in size and structure. Pulmonary Artery: Indeterminant PASP, inadequate TR jet. Venous: The inferior vena cava is normal in size with greater than 50% respiratory variability, suggesting right atrial pressure of 3 mmHg. IAS/Shunts: No atrial level shunt detected by color flow Doppler.  LEFT VENTRICLE PLAX 2D LVIDd:         3.82 cm  Diastology LVIDs:          2.13 cm  LV e' medial:    8.16 cm/s LV PW:         1.00 cm  LV E/e' medial:  12.5 LV IVS:        1.00 cm  LV e' lateral:   9.79 cm/s LVOT diam:     1.80 cm  LV E/e' lateral: 10.4 LV SV:         69 LV SV Index:   33 LVOT Area:     2.54 cm  RIGHT VENTRICLE RV S prime:     11.20 cm/s TAPSE (M-mode): 2.0 cm LEFT ATRIUM             Index       RIGHT ATRIUM          Index LA diam:        3.80 cm 1.80 cm/m  RA Area:     9.82 cm LA Vol (A2C):   42.2 ml 19.96 ml/m RA Volume:   18.30 ml 8.66 ml/m LA Vol (A4C):   24.9 ml 11.78 ml/m LA Biplane Vol: 33.8 ml 15.99 ml/m  AORTIC VALVE LVOT Vmax:   135.65 cm/s LVOT Vmean:  88.805 cm/s LVOT VTI:    0.272 m  AORTA Ao Root diam: 2.00 cm MITRAL VALVE MV Area (PHT): 2.74 cm     SHUNTS MV Decel Time: 277 msec     Systemic VTI:  0.27 m MV E velocity: 102.00 cm/s  Systemic Diam: 1.80 cm MV A velocity: 75.80 cm/s MV E/A ratio:  1.35 Carlyle Dolly MD Electronically signed by Carlyle Dolly MD Signature Date/Time: 11/06/2019/1:13:01 PM    Final     Orson Eva, DO  [facility]  If 7PM-7AM, please contact night-coverage [email or web address] Password [email or web address] 11/07/2019, 10:02 AM   LOS: 1 day

## 2019-11-07 NOTE — Discharge Summary (Signed)
Physician Discharge Summary  Patient ID: Kara Jimenez MRN: [id number] DOB/AGE: July 22, 1960 59 y.o.  Admit date: 11/05/2019 Discharge date: 11/07/19  Admission Diagnoses: acute appendicitis  Discharge Diagnoses:  Same as above  Discharged Condition: good  Hospital Course: admitted for above.  Underwent lap appy.  See op note for details.  Post op n/v initially, resolved with no specific intervention needed.  At time of d/c, tolerated regular diet, passing flatus, pain controlled.    Consults: None  Discharge Exam: Blood pressure (!) 151/68, pulse 63, temperature 98.2 F (36.8 C), temperature source Oral, resp. rate 12, height 5\' 6"  (1.676 m), weight 105.2 kg, last menstrual period 04/15/2019, SpO2 98 %. General appearance: alert, cooperative and no distress GI: soft, no guarding, appropriate TTP around incision sites, which are all clean.  slight erythema noted from irritation of wounds being within pannus folds but no sign of active infection  Disposition:  Discharge disposition: 01-Home or Self Care       Discharge Instructions    Call MD for:  difficulty breathing, headache or visual disturbances   Complete by: As directed    Call MD for:  persistant dizziness or light-headedness   Complete by: As directed    Call MD for:  persistant nausea and vomiting   Complete by: As directed    Call MD for:  redness, tenderness, or signs of infection (pain, swelling, redness, odor or green/yellow discharge around incision site)   Complete by: As directed    Call MD for:  severe uncontrolled pain   Complete by: As directed    Call MD for:  temperature >100.4   Complete by: As directed    Discharge instructions   Complete by: As directed    Discharge Laparoscopic Surgery Instructions:  Common Complaints: Right shoulder pain is common after laparoscopic surgery. This is secondary to the gas used in the surgery being trapped under the diaphragm.  Walk to help your body absorb  the gas. This will improve in a few days. Pain at the port sites are common, especially the larger port sites. This will improve with time.  Some nausea is common and poor appetite. The main goal is to stay hydrated the first few days after surgery.   Diet/ Activity: Diet as tolerated. You may not have an appetite, but it is important to stay hydrated. Drink 64 ounces of water a day. Your appetite will return with time.  Shower per your regular routine daily.  Do not take hot showers. Take warm showers that are less than 10 minutes. Rest and listen to your body, but do not remain in bed all day.  Walk everyday for at least 15-20 minutes. Deep cough and move around every 1-2 hours in the first few days after surgery.  Do not lift > 10 lbs, perform excessive bending, pushing, pulling, squatting for 1-2 weeks after surgery.  Do not pick at the dermabond glue on your incision sites.  This glue film will remain in place for 1-2 weeks and will start to peel off.  Do not place lotions or balms on your incision unless instructed to specifically by Dr. Constance Haw.   Pain Expectations and Narcotics: -After surgery you will have pain associated with your incisions and this is normal. The pain is muscular and nerve pain, and will get better with time. -You are encouraged and expected to take non narcotic medications like tylenol and ibuprofen (when able) to treat pain as multiple modalities can aid with pain treatment. -  Narcotics are only used when pain is severe or there is breakthrough pain. -You are not expected to have a pain score of 0 after surgery, as we cannot prevent pain. A pain score of 3-4 that allows you to be functional, move, walk, and tolerate some activity is the goal. The pain will continue to improve over the days after surgery and is dependent on your surgery. -Due to [place] law, we are only able to give a certain amount of pain medication to treat post operative pain, and we only give additional  narcotics on a patient by patient basis.  -For most laparoscopic surgery, studies have shown that the majority of patients only need 10-15 narcotic pills, and for open surgeries most patients only need 15-20.   -Having appropriate expectations of pain and knowledge of pain management with non narcotics is important as we do not want anyone to become addicted to narcotic pain medication.  -Using ice packs in the first 48 hours and heating pads after 48 hours, wearing an abdominal binder (when recommended), and using over the counter medications are all ways to help with pain management.   -Simple acts like meditation and mindfulness practices after surgery can also help with pain control and research has proven the benefit of these practices.  Medication: Take tylenol and ibuprofen as needed for pain control, alternating every 4-6 hours.  Example:  Tylenol 1000mg  @ 6am, 12noon, 6pm, 14midnight (Do not exceed 4000mg  of tylenol a day). Ibuprofen 800mg  @ 9am, 3pm, 9pm, 3am (Do not exceed 3600mg  of ibuprofen a day).  Take Roxicodone for breakthrough pain every 4 hours.  Take Colace for constipation related to narcotic pain medication. If you do not have a bowel movement in 2 days, take Miralax over the counter.  Drink plenty of water to also prevent constipation.   Contact Information: If you have questions or concerns, please call our office, [phone], Monday- Thursday 8AM-5PM and Friday 8AM-12Noon.  If it is after hours or on the weekend, please call [facility] Main Number, [phone], and ask to speak to the surgeon on call for Dr. Constance Haw at [facility].   Discharge patient   Complete by: As directed    Discharge disposition: 01-Home or Self Care   Discharge patient date: 11/07/2019   Increase activity slowly   Complete by: As directed      Allergies as of 11/07/2019      Reactions   Amitriptyline Shortness Of Breath   Duloxetine Shortness Of Breath   Gabapentin Shortness Of Breath    Pregabalin Shortness Of Breath   Sertraline Shortness Of Breath   Hydrochlorothiazide Nausea And Vomiting   Baclofen    Ibuprofen    Prednisone    Emesis    Amlodipine Rash      Medication List    TAKE these medications   acetaminophen 500 MG tablet Commonly known as: TYLENOL Take 500 mg by mouth every 6 (six) hours as needed.   docusate sodium 100 MG capsule Commonly known as: COLACE Take 1 capsule (100 mg total) by mouth 2 (two) times daily as needed for mild constipation (while taking narcotic).   hydrochlorothiazide 12.5 MG tablet Commonly known as: HYDRODIURIL Take 1 tablet (12.5 mg total) by mouth daily.   lisinopril 5 MG tablet Commonly known as: ZESTRIL Take 5 mg by mouth daily.   ondansetron 4 MG disintegrating tablet Commonly known as: ZOFRAN-ODT Take 1 tablet (4 mg total) by mouth every 6 (six) hours as needed for nausea.  oxyCODONE 5 MG immediate release tablet Commonly known as: Oxy IR/ROXICODONE Take 1 tablet (5 mg total) by mouth every 4 (four) hours as needed for severe pain or breakthrough pain.   Simbrinza 1-0.2 % Susp Generic drug: Brinzolamide-Brimonidine Apply 1 drop to eye daily.   tiZANidine 2 MG tablet Commonly known as: ZANAFLEX Take 2 mg by mouth at bedtime.   vitamin E 1000 UNIT capsule Take 1,000 Units by mouth daily.       Follow-up Information    Virl Cagey, MD Follow up in 2 week(s).   Specialty: General Surgery Contact information: [address] [place] [facility] [address] [phone]                Total time spent arranging discharge was <71min. Signed: Benjamine Sprague 11/07/2019, 9:22 AM

## 2019-11-07 NOTE — Progress Notes (Signed)
Patient discharged to home. Taken to car via wheelchair.

## 2019-11-09 LAB — SURGICAL PATHOLOGY

## 2019-11-12 ENCOUNTER — Telehealth: Payer: Self-pay | Admitting: General Surgery

## 2019-11-12 DIAGNOSIS — K869 Disease of pancreas, unspecified: Secondary | ICD-10-CM

## 2019-11-12 DIAGNOSIS — K862 Cyst of pancreas: Secondary | ICD-10-CM

## 2019-11-12 NOTE — Telephone Encounter (Signed)
[facility]   Results of pathology without acute appendicitis but findings that wound suggest some chronic inflammation or appendicitis based on my research. No cancer or concerning features.  About 1-2 years ago she says she had a similar presentation.    Patient has a pancreas lesion that will need imaging and we can order this MRI.   FINAL MICROSCOPIC DIAGNOSIS:   A. APPENDIX, APPENDECTOMY:  - Benign appendix with foamy histiocytes and fibro-obliterative tip  - Fibrous adhesions    GROSS DESCRIPTION:   Specimen: Received in formalin  Size: A vermiform appendix measuring 5.1 cm in length by 0.7 cm in  diameter. The proximal end displays possible cecal cuff, measuring 4.5  cm in length.  Serosa: Tan-gray, hyperemic, with moderate adhesions  Mucosa: Mucosa is tan-brown  Wall: Up to 0.2 cm in thickness  Lumen: Lumen is pinpoint, with a minimal amount of tan-brown fecal  material. At the distal tip possible diverticuli are noted. No  perforations are grossly identified.  Block Summary:  2 blocks submitted, with the resection margin inked black  (KL 11/06/2019)

## 2019-11-16 ENCOUNTER — Ambulatory Visit (INDEPENDENT_AMBULATORY_CARE_PROVIDER_SITE_OTHER): Payer: Medicare Other | Admitting: Internal Medicine

## 2019-11-16 DIAGNOSIS — G4733 Obstructive sleep apnea (adult) (pediatric): Secondary | ICD-10-CM | POA: Insufficient documentation

## 2019-11-16 DIAGNOSIS — Z9989 Dependence on other enabling machines and devices: Secondary | ICD-10-CM | POA: Diagnosis not present

## 2019-11-16 DIAGNOSIS — Z7189 Other specified counseling: Secondary | ICD-10-CM | POA: Diagnosis not present

## 2019-11-16 NOTE — Progress Notes (Signed)
[facility] [place], [place] [address]  Pulmonary Sleep Medicine   Office Visit Note  Patient Name: Kara Jimenez DOB: 1960-04-06 MRN [id number]    Chief Complaint: Obstructive Sleep Apnea visit  Brief History:  Kara Jimenez is seen today for initial consultation The patient has a 3 month history of sleep apnea. Patient is using PAP nightly.  She has chronic pain which can affect sleep. She recently had emergency surgery.  She gets hot flashes and then the mask feels too hot. She has been using a FFmask.The patient feels better after sleeping with PAP.  The patient reports benefiting from PAP use. Reported sleepiness is  improved and the Epworth Sleepiness Score is 4 out of 24. The patient does not take naps. The patient complains of the following: feeling hot under the mask  The compliance download shows very good compliance with an average use time of 6.1 hours. The AHI is 4.2  The patient does not complain of limb movements disrupting sleep.  ROS  General: (-) fever, (-) chills, (-) night sweat Nose and Sinuses: (-) nasal stuffiness or itchiness, (-) postnasal drip, (-) nosebleeds, (-) sinus trouble. Mouth and Throat: (-) sore throat, (-) hoarseness. Neck: (-) swollen glands, (-) enlarged thyroid, (-) neck pain. Respiratory: - cough, - shortness of breath, - wheezing. Neurologic: - numbness, - tingling. Psychiatric: - anxiety, - depression   Current Medication: Outpatient Encounter Medications as of 11/16/2019  Medication Sig  . acetaminophen (TYLENOL) 500 MG tablet Take 500 mg by mouth every 6 (six) hours as needed.  . Brinzolamide-Brimonidine (SIMBRINZA) 1-0.2 % SUSP Apply 1 drop to eye daily.   Marland Kitchen docusate sodium (COLACE) 100 MG capsule Take 1 capsule (100 mg total) by mouth 2 (two) times daily as needed for mild constipation (while taking narcotic).  Marland Kitchen lisinopril (ZESTRIL) 5 MG tablet Take 5 mg by mouth daily.  Marland Kitchen tiZANidine (ZANAFLEX) 2 MG tablet Take 2  mg by mouth at bedtime.   . vitamin E 1000 UNIT capsule Take 1,000 Units by mouth daily.  . [DISCONTINUED] hydrochlorothiazide (HYDRODIURIL) 12.5 MG tablet Take 1 tablet (12.5 mg total) by mouth daily. (Patient not taking: Reported on 11/05/2019)  . [DISCONTINUED] ondansetron (ZOFRAN-ODT) 4 MG disintegrating tablet Take 1 tablet (4 mg total) by mouth every 6 (six) hours as needed for nausea.  . [DISCONTINUED] oxyCODONE (OXY IR/ROXICODONE) 5 MG immediate release tablet Take 1 tablet (5 mg total) by mouth every 4 (four) hours as needed for severe pain or breakthrough pain.   No facility-administered encounter medications on file as of 11/16/2019.    Surgical History: Past Surgical History:  Procedure Laterality Date  . BREAST EXCISIONAL BIOPSY Right    benign  . CARPAL TUNNEL RELEASE Bilateral   . CESAREAN SECTION    . LAPAROSCOPIC APPENDECTOMY N/A 11/05/2019   Procedure: APPENDECTOMY LAPAROSCOPIC;  Surgeon: Virl Cagey, MD;  Location: [facility];  Service: General;  Laterality: N/A;  . NECK SURGERY    . PARTIAL HYSTERECTOMY    . ROTATOR CUFF REPAIR Left   . TUBAL LIGATION      Medical History: Past Medical History:  Diagnosis Date  . Arthritis   . Breast injury   . Carpal tunnel syndrome   . Fibromyalgia   . Sciatica   . Sleep apnea     Family History: Non contributory to the present illness  Social History: Social History   Socioeconomic History  . Marital status: Single    Spouse name: Not  on file  . Number of children: Not on file  . Years of education: Not on file  . Highest education level: Not on file  Occupational History  . Not on file  Tobacco Use  . Smoking status: Former Research scientist (life sciences)  . Smokeless tobacco: Never Used  Substance and Sexual Activity  . Alcohol use: Not Currently  . Drug use: Yes    Types: Marijuana  . Sexual activity: Not on file  Other Topics Concern  . Not on file  Social History Narrative  . Not on file   Social Determinants of  Health   Financial Resource Strain:   . Difficulty of Paying Living Expenses: Not on file  Food Insecurity:   . Worried About Charity fundraiser in the Last Year: Not on file  . Ran Out of Food in the Last Year: Not on file  Transportation Needs:   . Lack of Transportation (Medical): Not on file  . Lack of Transportation (Non-Medical): Not on file  Physical Activity:   . Days of Exercise per Week: Not on file  . Minutes of Exercise per Session: Not on file  Stress:   . Feeling of Stress : Not on file  Social Connections:   . Frequency of Communication with Friends and Family: Not on file  . Frequency of Social Gatherings with Friends and Family: Not on file  . Attends Religious Services: Not on file  . Active Member of Clubs or Organizations: Not on file  . Attends Archivist Meetings: Not on file  . Marital Status: Not on file  Intimate Partner Violence:   . Fear of Current or Ex-Partner: Not on file  . Emotionally Abused: Not on file  . Physically Abused: Not on file  . Sexually Abused: Not on file    Vital Signs: Blood pressure (!) 165/92, pulse 64, height 5\' 6"  (1.676 m), weight 226 lb (102.5 kg), last menstrual period 04/15/2019, SpO2 98 %.  Examination: General Appearance: The patient is well-developed, well-nourished, and in no distress. Neck Circumference: 39 Skin: Gross inspection of skin unremarkable. Head: normocephalic, no gross deformities. Eyes: no gross deformities noted. ENT: ears appear grossly normal Neurologic: Alert and oriented. No involuntary movements.    EPWORTH SLEEPINESS SCALE:  Scale:  (0)= no chance of dozing; (1)= slight chance of dozing; (2)= moderate chance of dozing; (3)= high chance of dozing  Chance  Situtation    Sitting and reading: 0    Watching TV: 2    Sitting Inactive in public: 0    As a passenger in car: 0      Lying down to rest: 2    Sitting and talking: 0    Sitting quielty after lunch: 0    In a  car, stopped in traffic: 0   TOTAL SCORE:   4 out of 24    SLEEP STUDIES:  1. 08/05/19 AH 39 SpO36min 81%   CPAP COMPLIANCE DATA:  Date Range: 10/13/19-11/11/19  Average Daily Use: 6.1 hours  Median Use: 6.1 Compliance for > 4 Hours: 87%  AHI: 4.2 respiratory events per hour  Days Used: 27/30  Mask Leak: 14.2  95th Percentile Pressure: 12.5   LABS: Recent Results (from the past 2160 hour(s))  Urinalysis, Routine w reflex microscopic Urine, Clean Catch     Status: Abnormal   Collection Time: 11/05/19 12:33 PM  Result Value Ref Range   Color, Urine STRAW (A) YELLOW   APPearance CLEAR CLEAR   Specific Gravity,  Urine 1.005 1.005 - 1.030   pH 5.0 5.0 - 8.0   Glucose, UA NEGATIVE NEGATIVE mg/dL   Hgb urine dipstick MODERATE (A) NEGATIVE   Bilirubin Urine NEGATIVE NEGATIVE   Ketones, ur NEGATIVE NEGATIVE mg/dL   Protein, ur NEGATIVE NEGATIVE mg/dL   Nitrite NEGATIVE NEGATIVE   Leukocytes,Ua NEGATIVE NEGATIVE   RBC / HPF 0-5 0 - 5 RBC/hpf   WBC, UA 0-5 0 - 5 WBC/hpf   Bacteria, UA NONE SEEN NONE SEEN   Squamous Epithelial / LPF 0-5 0 - 5   Mucus PRESENT     Comment: Performed at [facility], [address] [place], [place] [address]  CBC with Differential     Status: None   Collection Time: 11/05/19 12:36 PM  Result Value Ref Range   WBC 7.4 4.0 - 10.5 K/uL   RBC 4.45 3.87 - 5.11 MIL/uL   Hemoglobin 14.2 12.0 - 15.0 g/dL   HCT 42.5 36 - 46 %   MCV 95.5 80.0 - 100.0 fL   MCH 31.9 26.0 - 34.0 pg   MCHC 33.4 30.0 - 36.0 g/dL   RDW 12.7 11.5 - 15.5 %   Platelets 217 150 - 400 K/uL   nRBC 0.0 0.0 - 0.2 %   Neutrophils Relative % 61 %   Neutro Abs 4.6 1.7 - 7.7 K/uL   Lymphocytes Relative 32 %   Lymphs Abs 2.3 0.7 - 4.0 K/uL   Monocytes Relative 5 %   Monocytes Absolute 0.3 0.1 - 1.0 K/uL   Eosinophils Relative 1 %   Eosinophils Absolute 0.0 0.0 - 0.5 K/uL   Basophils Relative 1 %   Basophils Absolute 0.0 0.0 - 0.1 K/uL   Immature Granulocytes 0 %   Abs  Immature Granulocytes 0.02 0.00 - 0.07 K/uL    Comment: Performed at [facility], [address], [place], [place] [address]  Comprehensive metabolic panel     Status: None   Collection Time: 11/05/19 12:36 PM  Result Value Ref Range   Sodium 141 135 - 145 mmol/L   Potassium 3.8 3.5 - 5.1 mmol/L   Chloride 105 98 - 111 mmol/L   CO2 26 22 - 32 mmol/L   Glucose, Bld 95 70 - 99 mg/dL    Comment: Glucose reference range applies only to samples taken after fasting for at least 8 hours.   BUN 18 6 - 20 mg/dL   Creatinine, Ser 0.78 0.44 - 1.00 mg/dL   Calcium 9.3 8.9 - 10.3 mg/dL   Total Protein 7.9 6.5 - 8.1 g/dL   Albumin 4.2 3.5 - 5.0 g/dL   AST 18 15 - 41 U/L   ALT 16 0 - 44 U/L   Alkaline Phosphatase 61 38 - 126 U/L   Total Bilirubin 0.7 0.3 - 1.2 mg/dL   GFR, Estimated >60 >60 mL/min    Comment: (NOTE) Calculated using the CKD-EPI Creatinine Equation (2021)    Anion gap 10 5 - 15    Comment: Performed at [facility], [address], [place], [place] [address]  Lipase, blood     Status: None   Collection Time: 11/05/19 12:36 PM  Result Value Ref Range   Lipase 19 11 - 51 U/L    Comment: Performed at [facility], [address] [place], [place] [address]  Respiratory Panel by RT PCR (Flu A&B, Covid) - Nasopharyngeal Swab     Status: None   Collection Time: 11/05/19  2:33 PM   Specimen: Nasopharyngeal Swab  Result Value Ref Range   SARS Coronavirus 2 by RT PCR NEGATIVE NEGATIVE    Comment: (NOTE) SARS-CoV-2 target nucleic acids are NOT DETECTED.  The SARS-CoV-2 RNA is generally detectable in upper respiratoy specimens during the acute phase of infection. The lowest concentration of SARS-CoV-2 viral copies this assay can detect is 131 copies/mL. A negative result does not preclude SARS-Cov-2 infection and should not be used as the sole basis for treatment or other patient management decisions. A negative result may occur with  improper specimen collection/handling, submission  of specimen other than nasopharyngeal swab, presence of viral mutation(s) within the areas targeted by this assay, and inadequate number of viral copies (<131 copies/mL). A negative result must be combined with clinical observations, patient history, and epidemiological information. The expected result is Negative.  Fact Sheet for Patients:  PinkCheek.be  Fact Sheet for Healthcare Providers:  GravelBags.it  This test is no t yet approved or cleared by the [place] FDA and  has been authorized for detection and/or diagnosis of SARS-CoV-2 by FDA under an Emergency Use Authorization (EUA). This EUA will remain  in effect (meaning this test can be used) for the duration of the COVID-19 declaration under Section 564(b)(1) of the Act, 21 U.S.C. section 360bbb-3(b)(1), unless the authorization is terminated or revoked sooner.     Influenza A by PCR NEGATIVE NEGATIVE   Influenza B by PCR NEGATIVE NEGATIVE    Comment: (NOTE) The Xpert Xpress SARS-CoV-2/FLU/RSV assay is intended as an aid in  the diagnosis of influenza from Nasopharyngeal swab specimens and  should not be used as a sole basis for treatment. Nasal washings and  aspirates are unacceptable for Xpert Xpress SARS-CoV-2/FLU/RSV  testing.  Fact Sheet for Patients: PinkCheek.be  Fact Sheet for Healthcare Providers: GravelBags.it  This test is not yet approved or cleared by the [place] FDA and  has been authorized for detection and/or diagnosis of SARS-CoV-2 by  FDA under an Emergency Use Authorization (EUA). This EUA will remain  in effect (meaning this test can be used) for the duration of the  Covid-19 declaration under Section 564(b)(1) of the Act, 21  U.S.C. section 360bbb-3(b)(1), unless the authorization is  terminated or revoked. Performed at [facility], [address] [place], [place]  [address]   Surgical pathology     Status: None   Collection Time: 11/05/19  5:18 PM  Result Value Ref Range   SURGICAL PATHOLOGY      SURGICAL PATHOLOGY CASE: [id number] PATIENT: Alla German Surgical Pathology Report     Clinical History: Acute appendicitis (crm)   FINAL MICROSCOPIC DIAGNOSIS:  A. APPENDIX, APPENDECTOMY: -  Benign appendix with foamy histiocytes and fibro-obliterative tip -  Fibrous adhesions   GROSS DESCRIPTION:  Specimen: Received in formalin Size: A vermiform appendix measuring 5.1 cm in length by 0.7 cm in diameter.  The proximal end displays possible cecal cuff, measuring 4.5 cm in length. Serosa: Tan-gray, hyperemic, with moderate adhesions Mucosa: Mucosa is tan-brown Wall: Up to 0.2 cm in thickness Lumen: Lumen is pinpoint, with a minimal amount of tan-brown fecal material.  At the distal tip possible diverticuli are noted.  No perforations are grossly identified. Block Summary: 2 blocks submitted, with the resection margin inked black  Craig Staggers 11/06/2019)    Final Diagnosis performed by Thressa Sheller, MD.   Electronically signed 11/09/2019 Technical compo nent performed at [facility], [place] [address] [place],  [address].  Professional component performed at [employer]. [facility],  [address], [place], [place] [address].  Immunohistochemistry Technical component (if applicable) was performed at [facility]. [address], [place], [place], [place] [address].   IMMUNOHISTOCHEMISTRY DISCLAIMER (if applicable): Some of these immunohistochemical stains may have been developed and the performance characteristics determine by [facility]. Some may not have been cleared or approved by the U.S. Food and Drug Administration. The FDA has determined that such clearance or approval is not necessary. This test is used for clinical purposes. It should not be regarded as  investigational or for research. This laboratory is certified under the [place] (CLIA-88) as qualified to perform high complexity clinical laboratory testin g.  The controls stained appropriately.   MRSA PCR Screening     Status: Abnormal   Collection Time: 11/05/19  7:01 PM   Specimen: Nasal Mucosa; Nasopharyngeal  Result Value Ref Range   MRSA by PCR POSITIVE (A) NEGATIVE    Comment:        The GeneXpert MRSA Assay (FDA approved for NASAL specimens only), is one component of a comprehensive MRSA colonization surveillance program. It is not intended to diagnose MRSA infection nor to guide or monitor treatment for MRSA infections. RESULT CALLED TO, READ BACK BY AND VERIFIED WITH: R WAGONER,[email or web address]@[id number]  11/06/19 MKELLY Performed at [facility], [address] [place], [place] [address]   Basic metabolic panel     Status: Abnormal   Collection Time: 11/05/19  7:15 PM  Result Value Ref Range   Sodium 138 135 - 145 mmol/L   Potassium 3.4 (L) 3.5 - 5.1 mmol/L   Chloride 108 98 - 111 mmol/L   CO2 21 (L) 22 - 32 mmol/L   Glucose, Bld 129 (H) 70 - 99 mg/dL    Comment: Glucose reference range applies only to samples taken after fasting for at least 8 hours.   BUN 15 6 - 20 mg/dL   Creatinine, Ser 0.80 0.44 - 1.00 mg/dL   Calcium 8.4 (L) 8.9 - 10.3 mg/dL   GFR, Estimated >60 >60 mL/min    Comment: (NOTE) Calculated using the CKD-EPI Creatinine Equation (2021)    Anion gap 9 5 - 15    Comment: Performed at [facility], [address], [place],  [address]  CBC with Differential/Platelet     Status: Abnormal   Collection Time: 11/05/19  7:15 PM  Result Value Ref Range   WBC 11.6 (H) 4.0 - 10.5 K/uL   RBC 4.21 3.87 - 5.11 MIL/uL   Hemoglobin 13.5 12.0 - 15.0 g/dL   HCT 42.2 36 - 46 %   MCV 100.2 (H) 80.0 - 100.0 fL   MCH 32.1 26.0 - 34.0 pg   MCHC 32.0 30.0 - 36.0 g/dL   RDW 12.8 11.5 - 15.5 %   Platelets 191 150 - 400 K/uL   nRBC  0.0 0.0 - 0.2 %   Neutrophils Relative % 80 %   Neutro Abs 9.2 (H) 1.7 - 7.7 K/uL   Lymphocytes Relative 16 %   Lymphs Abs 1.9 0.7 - 4.0 K/uL   Monocytes Relative 4 %   Monocytes Absolute 0.4 0.1 - 1.0 K/uL   Eosinophils Relative 0 %   Eosinophils Absolute 0.0 0.0 - 0.5 K/uL   Basophils Relative 0 %   Basophils Absolute 0.0 0.0 - 0.1 K/uL   Immature Granulocytes 0 %   Abs Immature Granulocytes 0.03 0.00 - 0.07 K/uL    Comment: Performed at [facility], [address], [place], [place] [address]  Troponin I (High Sensitivity)     Status: None   Collection Time: 11/05/19  7:15 PM  Result Value Ref Range   Troponin I (High Sensitivity) 3 <18 ng/L    Comment: (NOTE) Elevated high sensitivity troponin I (hsTnI) values and significant  changes across serial measurements may suggest ACS but many other  chronic and acute conditions are known to elevate hsTnI results.  Refer to the "Links" section for chest pain algorithms and additional  guidance. Performed at [facility], [address], [place], [place] [address]   TSH     Status: None   Collection Time: 11/05/19  7:15 PM  Result Value Ref Range   TSH 3.002 0.350 - 4.500 uIU/mL    Comment: Performed by a 3rd Generation assay with a functional sensitivity of <=0.01 uIU/mL. Performed at [facility], [address] [place], [place] [address]   Magnesium     Status: None   Collection Time: 11/05/19  9:16 PM  Result Value Ref Range   Magnesium 1.9 1.7 - 2.4 mg/dL    Comment: Performed at [facility], [address] [place], [place] [address]  Troponin I (High Sensitivity)     Status: None   Collection Time: 11/05/19  9:16 PM  Result Value Ref Range   Troponin I (High Sensitivity) 4 <18 ng/L    Comment: (NOTE) Elevated high sensitivity troponin I (hsTnI) values and significant  changes across serial measurements may suggest ACS but many other  chronic and acute conditions are known to elevate hsTnI results.  Refer to the "Links"  section for chest pain algorithms and additional  guidance. Performed at [facility], [address], [place], [place] [address]   CBC with Differential/Platelet     Status: Abnormal   Collection Time: 11/06/19  8:02 AM  Result Value Ref Range   WBC 7.4 4.0 - 10.5 K/uL   RBC 3.79 (L) 3.87 - 5.11 MIL/uL   Hemoglobin 12.0 12.0 - 15.0 g/dL   HCT 37.4 36 - 46 %   MCV 98.7 80.0 - 100.0 fL   MCH 31.7 26.0 - 34.0 pg   MCHC 32.1 30.0 - 36.0 g/dL   RDW 13.2 11.5 - 15.5 %   Platelets 195 150 - 400 K/uL   nRBC 0.0 0.0 - 0.2 %   Neutrophils Relative % 65 %   Neutro Abs 4.8 1.7 - 7.7 K/uL   Lymphocytes Relative 29 %   Lymphs Abs 2.1 0.7 - 4.0 K/uL   Monocytes Relative 5 %   Monocytes Absolute 0.4 0.1 - 1.0 K/uL   Eosinophils Relative 1 %   Eosinophils Absolute 0.1 0.0 - 0.5 K/uL   Basophils Relative 0 %   Basophils Absolute 0.0 0.0 - 0.1 K/uL   Immature Granulocytes 0 %   Abs Immature Granulocytes 0.02 0.00 - 0.07 K/uL    Comment: Performed at [facility], [address], [place], [place] [address]  Basic metabolic panel     Status: Abnormal   Collection Time: 11/06/19  8:02 AM  Result Value Ref Range   Sodium 139 135 - 145 mmol/L   Potassium 3.5 3.5 - 5.1 mmol/L   Chloride 105 98 - 111 mmol/L   CO2 27 22 - 32 mmol/L   Glucose, Bld 91 70 - 99 mg/dL    Comment: Glucose reference range applies only to samples taken after fasting for at least 8 hours.   BUN 14 6 - 20 mg/dL  Creatinine, Ser 0.86 0.44 - 1.00 mg/dL   Calcium 7.9 (L) 8.9 - 10.3 mg/dL   GFR, Estimated >60 >60 mL/min    Comment: (NOTE) Calculated using the CKD-EPI Creatinine Equation (2021)    Anion gap 7 5 - 15    Comment: Performed at [facility], [address], [place], [place] [address]  Magnesium     Status: None   Collection Time: 11/06/19  8:02 AM  Result Value Ref Range   Magnesium 1.9 1.7 - 2.4 mg/dL    Comment: Performed at [facility], [address] [place], [place] [address]  ECHOCARDIOGRAM COMPLETE      Status: None   Collection Time: 11/06/19 11:35 AM  Result Value Ref Range   Weight 3,648 oz   Height 66 in   BP 157/69 mmHg   Area-P 1/2 2.74 cm2   S' Lateral 2.13 cm  Basic metabolic panel     Status: Abnormal   Collection Time: 11/07/19  4:51 AM  Result Value Ref Range   Sodium 141 135 - 145 mmol/L   Potassium 3.4 (L) 3.5 - 5.1 mmol/L   Chloride 106 98 - 111 mmol/L   CO2 26 22 - 32 mmol/L   Glucose, Bld 95 70 - 99 mg/dL    Comment: Glucose reference range applies only to samples taken after fasting for at least 8 hours.   BUN 14 6 - 20 mg/dL   Creatinine, Ser 0.85 0.44 - 1.00 mg/dL   Calcium 8.3 (L) 8.9 - 10.3 mg/dL   GFR, Estimated >60 >60 mL/min    Comment: (NOTE) Calculated using the CKD-EPI Creatinine Equation (2021)    Anion gap 9 5 - 15    Comment: Performed at [facility], [address] [place], [place] [address]  Magnesium     Status: None   Collection Time: 11/07/19  4:51 AM  Result Value Ref Range   Magnesium 2.1 1.7 - 2.4 mg/dL    Comment: Performed at [facility], [address], [place], [place] [address]  Phosphorus     Status: None   Collection Time: 11/07/19  4:51 AM  Result Value Ref Range   Phosphorus 3.1 2.5 - 4.6 mg/dL    Comment: Performed at [facility], [address] [place], [place] [address]    Radiology: CT ABDOMEN PELVIS W CONTRAST  Result Date: 11/05/2019 CLINICAL DATA:  Right lower quadrant abdominal pain EXAM: CT ABDOMEN AND PELVIS WITH CONTRAST TECHNIQUE: Multidetector CT imaging of the abdomen and pelvis was performed using the standard protocol following bolus administration of intravenous contrast. CONTRAST:  148mL OMNIPAQUE IOHEXOL 300 MG/ML  SOLN COMPARISON:  None. FINDINGS: Lower chest: No acute abnormality. Hepatobiliary: Subcentimeter low-density lesion within the left hepatic lobe, too small to definitively characterize but may represent a cyst. The liver is otherwise unremarkable. Normal appearance of the gallbladder. No  hyperdense gallstone. No biliary dilatation. Pancreas: There is a 7 mm low-density lesion within the uncinate process of the pancreas (series 2, image 29), possibly cyst. Pancreas appears otherwise within normal limits. No pancreatic ductal dilatation or peripancreatic inflammatory changes. Spleen: Normal in size without focal abnormality. Adrenals/Urinary Tract: 1 cm upper pole right renal cyst. Kidneys enhance symmetrically. No stone or hydronephrosis. Unremarkable bladder. Unremarkable adrenal glands. Stomach/Bowel: Hyperenhancing appendix measuring up to 15 mm in diameter with a small amount of fluid surrounding the tip of the appendix within the low pelvis (series 6, images 49-59). No organized peritendinous heel fluid collection. No extraluminal air is seen.  Stomach is within normal limits. There is a fluid filled loop of small bowel within the upper abdomen. Remaining small bowel is within normal limits. No evidence of bowel obstruction. Colon is within normal limits. Vascular/Lymphatic: Scattered aortoiliac atherosclerotic calcifications without aneurysm. No abdominopelvic lymphadenopathy. Reproductive: Status post hysterectomy. No adnexal masses. Other: No pneumoperitoneum. No abdominopelvic abscess. No abdominal wall hernia. Musculoskeletal: No acute or significant osseous findings. IMPRESSION: 1. Acute uncomplicated appendicitis. 2. There is a 7 mm low-density lesion within the uncinate process of the pancreas, possibly a cyst or IPMN. Further evaluation with nonemergent pancreatic protocol MRI is recommended. 3. Aortic atherosclerosis.  (ICD10-I70.0). Electronically Signed   By: Davina Poke D.O.   On: 11/05/2019 14:19   DG Chest Port 1 View  Result Date: 11/05/2019 CLINICAL DATA:  Bradycardia. EXAM: PORTABLE CHEST 1 VIEW COMPARISON:  Jun 09, 2019 FINDINGS: The heart size and mediastinal contours are within normal limits. Both lungs are clear. No pneumothorax or pleural effusion is noted. The  visualized skeletal structures are unremarkable. IMPRESSION: No active disease. Electronically Signed   By: Marijo Conception M.D.   On: 11/05/2019 18:34   ECHOCARDIOGRAM COMPLETE  Result Date: 11/06/2019    ECHOCARDIOGRAM REPORT   Patient Name:   Kara Jimenez Date of Exam: 11/06/2019 Medical Rec #:  [id number]        Height:       66.0 in Accession #:    [id number]       Weight:       228.0 lb Date of Birth:  01-18-1960        BSA:          2.114 m Patient Age:    56 years         BP:           157/69 mmHg Patient Gender: F                HR:           68 bpm. Exam Location:  [facility] Procedure: 2D Echo Indications:    Bradycardia [[id number]  History:        Patient has no prior history of Echocardiogram examinations.                 Risk Factors:Non-Smoker. Acute appendicitis, Bradycardia.  Sonographer:    Leavy Cella RDCS (AE) Referring Phys: [place]  1. Left ventricular ejection fraction, by estimation, is 60 to 65%. The left ventricle has normal function. The left ventricle has no regional wall motion abnormalities. Left ventricular diastolic parameters were normal.  2. Right ventricular systolic function is normal. The right ventricular size is normal.  3. The mitral valve is normal in structure. Trivial mitral valve regurgitation. No evidence of mitral stenosis.  4. The aortic valve is tricuspid. Aortic valve regurgitation is not visualized. No aortic stenosis is present.  5. The inferior vena cava is normal in size with greater than 50% respiratory variability, suggesting right atrial pressure of 3 mmHg. FINDINGS  Left Ventricle: Left ventricular ejection fraction, by estimation, is 60 to 65%. The left ventricle has normal function. The left ventricle has no regional wall motion abnormalities. The left ventricular internal cavity size was normal in size. There is  no left ventricular hypertrophy. Left ventricular diastolic parameters were normal. Right Ventricle: The  right ventricular size is normal. No increase in right ventricular wall thickness. Right ventricular systolic function is normal. Left Atrium: Left atrial size was  normal in size. Right Atrium: Right atrial size was normal in size. Pericardium: There is no evidence of pericardial effusion. Mitral Valve: The mitral valve is normal in structure. Trivial mitral valve regurgitation. No evidence of mitral valve stenosis. Tricuspid Valve: The tricuspid valve is normal in structure. Tricuspid valve regurgitation is trivial. No evidence of tricuspid stenosis. Aortic Valve: The aortic valve is tricuspid. Aortic valve regurgitation is not visualized. No aortic stenosis is present. Pulmonic Valve: The pulmonic valve was not well visualized. Pulmonic valve regurgitation is not visualized. No evidence of pulmonic stenosis. Aorta: The aortic root is normal in size and structure. Pulmonary Artery: Indeterminant PASP, inadequate TR jet. Venous: The inferior vena cava is normal in size with greater than 50% respiratory variability, suggesting right atrial pressure of 3 mmHg. IAS/Shunts: No atrial level shunt detected by color flow Doppler.  LEFT VENTRICLE PLAX 2D LVIDd:         3.82 cm  Diastology LVIDs:         2.13 cm  LV e' medial:    8.16 cm/s LV PW:         1.00 cm  LV E/e' medial:  12.5 LV IVS:        1.00 cm  LV e' lateral:   9.79 cm/s LVOT diam:     1.80 cm  LV E/e' lateral: 10.4 LV SV:         69 LV SV Index:   33 LVOT Area:     2.54 cm  RIGHT VENTRICLE RV S prime:     11.20 cm/s TAPSE (M-mode): 2.0 cm LEFT ATRIUM             Index       RIGHT ATRIUM          Index LA diam:        3.80 cm 1.80 cm/m  RA Area:     9.82 cm LA Vol (A2C):   42.2 ml 19.96 ml/m RA Volume:   18.30 ml 8.66 ml/m LA Vol (A4C):   24.9 ml 11.78 ml/m LA Biplane Vol: 33.8 ml 15.99 ml/m  AORTIC VALVE LVOT Vmax:   135.65 cm/s LVOT Vmean:  88.805 cm/s LVOT VTI:    0.272 m  AORTA Ao Root diam: 2.00 cm MITRAL VALVE MV Area (PHT): 2.74 cm     SHUNTS MV  Decel Time: 277 msec     Systemic VTI:  0.27 m MV E velocity: 102.00 cm/s  Systemic Diam: 1.80 cm MV A velocity: 75.80 cm/s MV E/A ratio:  1.35 Carlyle Dolly MD Electronically signed by Carlyle Dolly MD Signature Date/Time: 11/06/2019/1:13:01 PM    Final     No results found.  CT ABDOMEN PELVIS W CONTRAST  Result Date: 11/05/2019 CLINICAL DATA:  Right lower quadrant abdominal pain EXAM: CT ABDOMEN AND PELVIS WITH CONTRAST TECHNIQUE: Multidetector CT imaging of the abdomen and pelvis was performed using the standard protocol following bolus administration of intravenous contrast. CONTRAST:  146mL OMNIPAQUE IOHEXOL 300 MG/ML  SOLN COMPARISON:  None. FINDINGS: Lower chest: No acute abnormality. Hepatobiliary: Subcentimeter low-density lesion within the left hepatic lobe, too small to definitively characterize but may represent a cyst. The liver is otherwise unremarkable. Normal appearance of the gallbladder. No hyperdense gallstone. No biliary dilatation. Pancreas: There is a 7 mm low-density lesion within the uncinate process of the pancreas (series 2, image 29), possibly cyst. Pancreas appears otherwise within normal limits. No pancreatic ductal dilatation or peripancreatic inflammatory changes. Spleen: Normal in size without  focal abnormality. Adrenals/Urinary Tract: 1 cm upper pole right renal cyst. Kidneys enhance symmetrically. No stone or hydronephrosis. Unremarkable bladder. Unremarkable adrenal glands. Stomach/Bowel: Hyperenhancing appendix measuring up to 15 mm in diameter with a small amount of fluid surrounding the tip of the appendix within the low pelvis (series 6, images 49-59). No organized peritendinous heel fluid collection. No extraluminal air is seen. Stomach is within normal limits. There is a fluid filled loop of small bowel within the upper abdomen. Remaining small bowel is within normal limits. No evidence of bowel obstruction. Colon is within normal limits. Vascular/Lymphatic:  Scattered aortoiliac atherosclerotic calcifications without aneurysm. No abdominopelvic lymphadenopathy. Reproductive: Status post hysterectomy. No adnexal masses. Other: No pneumoperitoneum. No abdominopelvic abscess. No abdominal wall hernia. Musculoskeletal: No acute or significant osseous findings. IMPRESSION: 1. Acute uncomplicated appendicitis. 2. There is a 7 mm low-density lesion within the uncinate process of the pancreas, possibly a cyst or IPMN. Further evaluation with nonemergent pancreatic protocol MRI is recommended. 3. Aortic atherosclerosis.  (ICD10-I70.0). Electronically Signed   By: Davina Poke D.O.   On: 11/05/2019 14:19   DG Chest Port 1 View  Result Date: 11/05/2019 CLINICAL DATA:  Bradycardia. EXAM: PORTABLE CHEST 1 VIEW COMPARISON:  Jun 09, 2019 FINDINGS: The heart size and mediastinal contours are within normal limits. Both lungs are clear. No pneumothorax or pleural effusion is noted. The visualized skeletal structures are unremarkable. IMPRESSION: No active disease. Electronically Signed   By: Marijo Conception M.D.   On: 11/05/2019 18:34   ECHOCARDIOGRAM COMPLETE  Result Date: 11/06/2019    ECHOCARDIOGRAM REPORT   Patient Name:   Kara Jimenez Date of Exam: 11/06/2019 Medical Rec #:  [id number]        Height:       66.0 in Accession #:    [id number]       Weight:       228.0 lb Date of Birth:  December 15, 1960        BSA:          2.114 m Patient Age:    72 years         BP:           157/69 mmHg Patient Gender: F                HR:           68 bpm. Exam Location:  [facility] Procedure: 2D Echo Indications:    Bradycardia [[id number]  History:        Patient has no prior history of Echocardiogram examinations.                 Risk Factors:Non-Smoker. Acute appendicitis, Bradycardia.  Sonographer:    Leavy Cella RDCS (AE) Referring Phys: [place]  1. Left ventricular ejection fraction, by estimation, is 60 to 65%. The left ventricle has normal  function. The left ventricle has no regional wall motion abnormalities. Left ventricular diastolic parameters were normal.  2. Right ventricular systolic function is normal. The right ventricular size is normal.  3. The mitral valve is normal in structure. Trivial mitral valve regurgitation. No evidence of mitral stenosis.  4. The aortic valve is tricuspid. Aortic valve regurgitation is not visualized. No aortic stenosis is present.  5. The inferior vena cava is normal in size with greater than 50% respiratory variability, suggesting right atrial pressure of 3 mmHg. FINDINGS  Left Ventricle: Left ventricular ejection fraction, by estimation, is 60 to 65%. The  left ventricle has normal function. The left ventricle has no regional wall motion abnormalities. The left ventricular internal cavity size was normal in size. There is  no left ventricular hypertrophy. Left ventricular diastolic parameters were normal. Right Ventricle: The right ventricular size is normal. No increase in right ventricular wall thickness. Right ventricular systolic function is normal. Left Atrium: Left atrial size was normal in size. Right Atrium: Right atrial size was normal in size. Pericardium: There is no evidence of pericardial effusion. Mitral Valve: The mitral valve is normal in structure. Trivial mitral valve regurgitation. No evidence of mitral valve stenosis. Tricuspid Valve: The tricuspid valve is normal in structure. Tricuspid valve regurgitation is trivial. No evidence of tricuspid stenosis. Aortic Valve: The aortic valve is tricuspid. Aortic valve regurgitation is not visualized. No aortic stenosis is present. Pulmonic Valve: The pulmonic valve was not well visualized. Pulmonic valve regurgitation is not visualized. No evidence of pulmonic stenosis. Aorta: The aortic root is normal in size and structure. Pulmonary Artery: Indeterminant PASP, inadequate TR jet. Venous: The inferior vena cava is normal in size with greater than 50%  respiratory variability, suggesting right atrial pressure of 3 mmHg. IAS/Shunts: No atrial level shunt detected by color flow Doppler.  LEFT VENTRICLE PLAX 2D LVIDd:         3.82 cm  Diastology LVIDs:         2.13 cm  LV e' medial:    8.16 cm/s LV PW:         1.00 cm  LV E/e' medial:  12.5 LV IVS:        1.00 cm  LV e' lateral:   9.79 cm/s LVOT diam:     1.80 cm  LV E/e' lateral: 10.4 LV SV:         69 LV SV Index:   33 LVOT Area:     2.54 cm  RIGHT VENTRICLE RV S prime:     11.20 cm/s TAPSE (M-mode): 2.0 cm LEFT ATRIUM             Index       RIGHT ATRIUM          Index LA diam:        3.80 cm 1.80 cm/m  RA Area:     9.82 cm LA Vol (A2C):   42.2 ml 19.96 ml/m RA Volume:   18.30 ml 8.66 ml/m LA Vol (A4C):   24.9 ml 11.78 ml/m LA Biplane Vol: 33.8 ml 15.99 ml/m  AORTIC VALVE LVOT Vmax:   135.65 cm/s LVOT Vmean:  88.805 cm/s LVOT VTI:    0.272 m  AORTA Ao Root diam: 2.00 cm MITRAL VALVE MV Area (PHT): 2.74 cm     SHUNTS MV Decel Time: 277 msec     Systemic VTI:  0.27 m MV E velocity: 102.00 cm/s  Systemic Diam: 1.80 cm MV A velocity: 75.80 cm/s MV E/A ratio:  1.35 Carlyle Dolly MD Electronically signed by Carlyle Dolly MD Signature Date/Time: 11/06/2019/1:13:01 PM    Final       Assessment and Plan: Patient Active Problem List   Diagnosis Date Noted  . Class 2 obesity 11/06/2019  . Bradycardia   . Acute appendicitis 11/05/2019  . Pancreatic lesion 11/05/2019      The patient does tolerate PAP and reports significant benefit from PAP use. The patient is using an ultra violet light cleaner and advised to increase her humidifier setting and to go back to using the nasal mask.  The  compliance is very good. The apnea is within acceptable levels.   1. OSA- continue nightly use of CPAP. 2. CPAP couseling-Discussed importance of adequate CPAP use as well as proper care and cleaning techniques of machine and all supplies.  General Counseling: I have discussed the findings of the evaluation and  examination with Zigmund Daniel.  I have also discussed any further diagnostic evaluation thatmay be needed or ordered today. Kellis verbalizes understanding of the findings of todays visit. We also reviewed her medications today and discussed drug interactions and side effects including but not limited excessive drowsiness and altered mental states. We also discussed that there is always a risk not just to her but also people around her. she has been encouraged to call the office with any questions or concerns that should arise related to todays visit.   I have personally obtained a history, examined the patient, evaluated laboratory and imaging results, formulated the assessment and plan and placed orders.  This patient was seen by Luiz Ochoa, AGNP-C in collaboration with Dr. Devona Konig as a part of collaborative care agreement.  Richelle Ito Saunders Glance, PhD, FAASM  Diplomate, American Board of Sleep Medicine    Allyne Gee, MD [facility] Diplomate ABMS Pulmonary and Critical Care Medicine Sleep medicine

## 2019-11-16 NOTE — Patient Instructions (Signed)

## 2019-11-19 ENCOUNTER — Ambulatory Visit (INDEPENDENT_AMBULATORY_CARE_PROVIDER_SITE_OTHER): Payer: Medicare Other | Admitting: General Surgery

## 2019-11-19 ENCOUNTER — Encounter: Payer: Self-pay | Admitting: General Surgery

## 2019-11-19 ENCOUNTER — Other Ambulatory Visit: Payer: Self-pay

## 2019-11-19 VITALS — BP 113/77 | HR 68 | Temp 98.0°F | Resp 18 | Ht 66.0 in | Wt 227.0 lb

## 2019-11-19 DIAGNOSIS — K3589 Other acute appendicitis without perforation or gangrene: Secondary | ICD-10-CM

## 2019-11-19 DIAGNOSIS — K869 Disease of pancreas, unspecified: Secondary | ICD-10-CM

## 2019-11-19 NOTE — Patient Instructions (Signed)
Keep incisions that have opened up covered with bandaid and can do some neosporin to area. Will call with MRI results.  Diet and activity as tolerated.

## 2019-11-19 NOTE — Telephone Encounter (Signed)
MRI Scheduled and pt aware - MRI does not require authorization

## 2019-11-19 NOTE — Progress Notes (Signed)
[facility] Note   HPI:  59 y.o. Female presents to clinic for post-op follow-up evaluation after laparoscopic appendectomy. Patient reports she is feeling so much better. Says she feels a little swollen on the left side but is constipated at baseline and has a BM every 3 days recently. Her pathology came back without acute appendicitis but more findings consistent with chronic inflammation which goes with her history.   Review of Systems:  No fever or chills Improving constipation  All other review of systems: otherwise negative   Vital Signs:  BP 113/77   Pulse 68   Temp 98 F (36.7 C) (Oral)   Resp 18   Ht 5\' 6"  (1.676 m)   Wt 227 lb (103 kg)   LMP 04/15/2019   SpO2 98%   BMI 36.64 kg/m    Physical Exam:  Physical Exam HENT:     Head: Normocephalic.  Cardiovascular:     Rate and Rhythm: Normal rate.  Pulmonary:     Effort: Pulmonary effort is normal.  Abdominal:     General: There is no distension.     Palpations: Abdomen is soft.     Tenderness: There is no abdominal tenderness.     Comments: Port sites healing, LLQ and lower incision opening slightly, no erythema, clear drainage  Neurological:     Mental Status: She is alert.       Assessment:  58 y.o. yo Female s/p laparoscopic appendectomy. Doing well overall.  Plan:  Keep incisions that have opened up covered with bandaid and can do some neosporin to area. Will call with MRI results.  Diet and activity as tolerated.  PRN follow up    Curlene Labrum, MD [facility] [address] [place], [place] [address] [phone] (office)

## 2019-11-20 ENCOUNTER — Ambulatory Visit: Payer: Medicare Other

## 2019-11-25 ENCOUNTER — Ambulatory Visit (INDEPENDENT_AMBULATORY_CARE_PROVIDER_SITE_OTHER): Payer: Medicare Other | Admitting: Allergy & Immunology

## 2019-11-25 ENCOUNTER — Encounter: Payer: Self-pay | Admitting: Allergy & Immunology

## 2019-11-25 ENCOUNTER — Other Ambulatory Visit: Payer: Self-pay

## 2019-11-25 VITALS — BP 122/74 | HR 85 | Temp 98.3°F | Resp 18 | Ht 66.0 in | Wt 224.0 lb

## 2019-11-25 DIAGNOSIS — K9049 Malabsorption due to intolerance, not elsewhere classified: Secondary | ICD-10-CM | POA: Diagnosis not present

## 2019-11-25 DIAGNOSIS — J3089 Other allergic rhinitis: Secondary | ICD-10-CM

## 2019-11-25 NOTE — Patient Instructions (Signed)
1. Perennial allergic rhinitis - Testing was positive only to dog. - This does explain your symptoms. - Copy of results provided.  - Use antihistamines (2-4 times the recommended dose) when you are visiting someone with dogs.   2. Food intolerance - Testing was negative to everything tested. - There is a the low positive predictive value of food allergy testing and hence the high possibility of false positives. - In contrast, food allergy testing has a high negative predictive value, therefore if testing is negative we can be relatively assured that they are indeed negative.  - There is no need for an epinephrine autoinjector at this time.  3. Follow up as needed.  Please inform us of any Emergency Department visits, hospitalizations, or changes in symptoms. Call us before going to the ED for breathing or allergy symptoms since we might be able to fit you in for a sick visit. Feel free to contact us anytime with any questions, problems, or concerns.  It was a pleasure to meet you today!  Websites that have reliable patient information: 1. American Academy of Asthma, Allergy, and Immunology: www.aaaai.org 2. Food Allergy Research and Education (FARE): foodallergy.org 3. Mothers of Asthmatics: http://www.asthmacommunitynetwork.org 4. American College of Allergy, Asthma, and Immunology: www.acaai.org   COVID-19 Vaccine Information can be found at: ShippingScam.co.uk For questions related to vaccine distribution or appointments, please email [email or web address] or call [phone].     "Like" [place] on Facebook and Instagram for our latest updates!     HAPPY FALL!     Make sure you are registered to vote! If you have moved or changed any of your contact information, you will need to get this updated before voting!  In some cases, you MAY be able to register to vote online: [email or web address]

## 2019-11-25 NOTE — Progress Notes (Signed)
NEW PATIENT  Date of Service/Encounter:  11/25/19  Referring provider: Alfonse Flavors, MD   Assessment:   Perennial allergic rhinitis (dog)  Food intolerance - with negative testing to everything evaluated  Perioral rash  Plan/Recommendations:   1. Perennial allergic rhinitis - Testing was positive only to dog. - This does explain your symptoms. - Copy of results provided.  - Use antihistamines (2-4 times the recommended dose) when you are visiting someone with dogs.   2. Food intolerance - Testing was negative to everything tested. - There is a the low positive predictive value of food allergy testing and hence the high possibility of false positives. - In contrast, food allergy testing has a high negative predictive value, therefore if testing is negative we can be relatively assured that they are indeed negative.  - There is no need for an epinephrine autoinjector at this time.  3. Follow up as needed.  Subjective:   Kara Jimenez is a 59 y.o. female presenting today for evaluation of  Chief Complaint  Patient presents with  . Food Intolerance    strawberry? tomato?  . Sinus Problem    Kara Jimenez has a history of the following: Patient Active Problem List   Diagnosis Date Noted  . OSA on CPAP 11/16/2019  . CPAP use counseling 11/16/2019  . Class 2 obesity 11/06/2019  . Bradycardia   . Acute appendicitis 11/05/2019  . Pancreatic lesion 11/05/2019    History obtained from: chart review and patient.  Kara Jimenez was referred by Zhou-Talbert, Elwyn Lade, MD.     Kara Jimenez is a 59 y.o. female presenting for an evaluation of rash around her mouth.  She reports a rough rash on her lips when she eats tomatoes or strawberries. She never has any progression to her throat and tongue. It has been a "while". This is not a rash that you can "see", but she can just feel it. It sometimes itches and it lasts as long as does not eat something else. It  lasts 2-3 days. She did take Claritin at one point and it did help. Most of the time, she does not take anything at all. This is cooked or raw strawberries and tomatoes. She does eat ketchup and spaghetti. She ate a a hot dog yesterday with ketchup and she did fine with it. She is not worried about any other food in particular, but she wants to "get it all checked". She never went to the hospital for this at all. It definitely started when she lived in [place] and she moved here around 2007. She does not have a clear trigger aside from the strawberries and tomatoes.    This does not seem to be associated with any medications that she was started on at all.    Allergic Rhinitis Symptom History: She reports that she has an allergy to dogs. She wakes up with eye watering and redness when she visits her sister. She also had a similar reaction when hse visited her friend with a dog. She feels that she is beocming more allergic as she gets older.    She has been on disability since 2015 for fibromyalgia and carpal tunnel. She has sciatica. She does see a pain specialist.  Otherwise, there is no history of other atopic diseases, including asthma, drug allergies, stinging insect allergies, eczema, urticaria or contact dermatitis. There is no significant infectious history. Vaccinations are up to date.    Past Medical History: Patient Active  Problem List   Diagnosis Date Noted  . OSA on CPAP 11/16/2019  . CPAP use counseling 11/16/2019  . Class 2 obesity 11/06/2019  . Bradycardia   . Acute appendicitis 11/05/2019  . Pancreatic lesion 11/05/2019    Medication List:  Allergies as of 11/25/2019      Reactions   Amitriptyline Shortness Of Breath   Duloxetine Shortness Of Breath   Gabapentin Shortness Of Breath   Pregabalin Shortness Of Breath   Sertraline Shortness Of Breath   Hydrochlorothiazide Nausea And Vomiting   Baclofen    Ibuprofen    Prednisone    Emesis    Amlodipine Rash       Medication List       Accurate as of November 25, 2019 11:59 PM. If you have any questions, ask your nurse or doctor.        STOP taking these medications   docusate sodium 100 MG capsule Commonly known as: COLACE Stopped by: Valentina Shaggy, MD     TAKE these medications   acetaminophen 500 MG tablet Commonly known as: TYLENOL Take 500 mg by mouth every 6 (six) hours as needed.   lisinopril 5 MG tablet Commonly known as: ZESTRIL Take 5 mg by mouth daily.   loratadine 10 MG tablet Commonly known as: CLARITIN Take 10 mg by mouth daily.   Simbrinza 1-0.2 % Susp Generic drug: Brinzolamide-Brimonidine Apply 1 drop to eye daily.   tiZANidine 2 MG tablet Commonly known as: ZANAFLEX Take 2 mg by mouth at bedtime.   vitamin E 1000 UNIT capsule Take 1,000 Units by mouth daily.       Birth History: non-contributory  Developmental History: non-contributory  Past Surgical History: Past Surgical History:  Procedure Laterality Date  . BREAST EXCISIONAL BIOPSY Right    benign  . CARPAL TUNNEL RELEASE Bilateral   . CESAREAN SECTION    . LAPAROSCOPIC APPENDECTOMY N/A 11/05/2019   Procedure: APPENDECTOMY LAPAROSCOPIC;  Surgeon: Virl Cagey, MD;  Location: [facility];  Service: General;  Laterality: N/A;  . NECK SURGERY    . PARTIAL HYSTERECTOMY    . ROTATOR CUFF REPAIR Left   . TUBAL LIGATION       Family History: Family History  Problem Relation Age of Onset  . HIV/AIDS Mother   . Hypertension Father      Social History: Kara Jimenez lives at home with her family.  Lives in a house with rugs throughout the home.  She has electric heating and central cooling.  There are cats outside of the home.  Dust mite covers on the bed, but not the pillows.  There is tobacco exposure in the home, but not the car.  She is currently disabled.  She is not exposed to fumes, chemicals, or dust.  She does not use a HEPA filter.  She does not live near an interstate or industrial  area.   Review of Systems  Constitutional: Negative.  Negative for fever, malaise/fatigue and weight loss.  HENT: Negative.  Negative for congestion, ear discharge, ear pain, sinus pain and sore throat.   Eyes: Negative for pain, discharge and redness.  Respiratory: Negative for cough, sputum production, shortness of breath and wheezing.   Cardiovascular: Negative.  Negative for chest pain and palpitations.  Gastrointestinal: Negative for abdominal pain, heartburn, nausea and vomiting.  Skin: Negative.  Negative for itching and rash.  Neurological: Negative for dizziness and headaches.  Endo/Heme/Allergies: Negative for environmental allergies. Does not bruise/bleed easily.  Positive for possible food allergies.       Objective:   Blood pressure 122/74, pulse 85, temperature 98.3 F (36.8 C), temperature source Temporal, resp. rate 18, height 5\' 6"  (1.676 m), weight 224 lb (101.6 kg), last menstrual period 04/15/2019, SpO2 98 %. Body mass index is 36.15 kg/m.   Physical Exam:   Physical Exam Constitutional:      Appearance: She is well-developed.     Comments: Pleasant and talkative.   HENT:     Head: Normocephalic and atraumatic.     Right Ear: Tympanic membrane, ear canal and external ear normal. No drainage, swelling or tenderness. Tympanic membrane is not injected, scarred, erythematous, retracted or bulging.     Left Ear: Tympanic membrane, ear canal and external ear normal. No drainage, swelling or tenderness. Tympanic membrane is not injected, scarred, erythematous, retracted or bulging.     Nose: No nasal deformity, septal deviation, mucosal edema or rhinorrhea.     Right Turbinates: Enlarged. Not swollen or pale.     Left Turbinates: Enlarged. Not swollen or pale.     Right Sinus: No maxillary sinus tenderness or frontal sinus tenderness.     Left Sinus: No maxillary sinus tenderness or frontal sinus tenderness.     Mouth/Throat:     Mouth: Mucous membranes  are not pale and not dry.     Pharynx: Uvula midline.  Eyes:     General:        Right eye: No discharge.        Left eye: No discharge.     Conjunctiva/sclera: Conjunctivae normal.     Right eye: Right conjunctiva is not injected. No chemosis.    Left eye: Left conjunctiva is not injected. No chemosis.    Pupils: Pupils are equal, round, and reactive to light.  Cardiovascular:     Rate and Rhythm: Normal rate and regular rhythm.     Heart sounds: Normal heart sounds.  Pulmonary:     Effort: Pulmonary effort is normal. No tachypnea, accessory muscle usage or respiratory distress.     Breath sounds: Normal breath sounds. No wheezing, rhonchi or rales.     Comments: Moving air well in all lung fields. No increased work of breathing noted. No crackles or wheezes.  Chest:     Chest wall: No tenderness.  Abdominal:     Tenderness: There is no abdominal tenderness. There is no guarding or rebound.  Lymphadenopathy:     Head:     Right side of head: No submandibular, tonsillar or occipital adenopathy.     Left side of head: No submandibular, tonsillar or occipital adenopathy.     Cervical: No cervical adenopathy.  Skin:    Coloration: Skin is not pale.     Findings: No abrasion, erythema, petechiae or rash. Rash is not papular, urticarial or vesicular.     Comments: No eczematous or urticarial lesions noted. No perioral rash appreciated today.  Neurological:     Mental Status: She is alert.  Psychiatric:        Behavior: Behavior is cooperative.      Diagnostic studies:   Allergy Studies:     Airborne Adult Perc - 11/25/19 1544    Time Antigen Placed 0300    Allergen Manufacturer Lavella Hammock    Location Back    Number of Test 59    Panel 1 Select    1. Control-Buffer 50% Glycerol Negative    2. Control-Histamine 1 mg/ml 2+    3.  Albumin saline Negative    4. [place] Negative    5. [place] Negative    6. Johnson Negative    7. [place] Blue Negative    8. Meadow Fescue Negative     9. Perennial Rye Negative    10. Sweet Vernal Negative    11. Timothy Negative    12. Cocklebur Negative    13. Burweed Marshelder Negative    14. Ragweed, short Negative    15. Ragweed, Giant Negative    16. Plantain,  English Negative    17. Lamb's Quarters Negative    18. Sheep Sorrell Negative    19. Rough Pigweed Negative    20. Marsh Elder, Rough Negative    21. Mugwort, Common Negative    22. Ash mix Negative    23. Birch mix Negative    24. Beech American Negative    25. Box, Elder Negative    26. Cedar, red Negative    27. Cottonwood, [place] Negative    28. Elm mix Negative    29. Hickory Negative    30. Maple mix Negative    31. Oak, [place] mix Negative    32. Pecan Pollen Negative    33. Pine mix Negative    34. Sycamore Eastern Negative    35. [place], Black Pollen Negative    36. Alternaria alternata Negative    37. Cladosporium Herbarum Negative    38. Aspergillus mix Negative    39. Penicillium mix Negative    40. Bipolaris sorokiniana (Helminthosporium) Negative    41. Drechslera spicifera (Curvularia) Negative    42. Mucor plumbeus Negative    43. Fusarium moniliforme Negative    44. Aureobasidium pullulans (pullulara) Negative    45. Rhizopus oryzae Negative    46. Botrytis cinera Negative    47. Epicoccum nigrum Negative    48. Phoma betae Negative    49. Candida Albicans Negative    50. Trichophyton mentagrophytes Negative    51. Mite, D Farinae  5,000 AU/ml Negative    52. Mite, D Pteronyssinus  5,000 AU/ml Negative    53. Cat Hair 10,000 BAU/ml Negative    54.  Dog Epithelia Negative    55. Mixed Feathers Negative    56. Horse Epithelia Negative    57. Cockroach, German Negative    58. Mouse Negative    59. Tobacco Leaf Negative          Intradermal - 11/25/19 1544    Time Antigen Placed 1544    Allergen Manufacturer Lavella Hammock    Location Arm    Number of Test 15    Intradermal Select    Control Negative    [place] Negative     Johnson Negative    7 Grass Negative    Ragweed mix Negative    Weed mix Negative    Tree mix Negative    Mold 1 Negative    Mold 2 Negative    Mold 3 Negative    Mold 4 Negative    Cat Negative    Dog 2+    Cockroach Negative    Mite mix Negative          Food Adult Perc - 11/25/19 1500    Time Antigen Placed 1500    Allergen Manufacturer Greer    Location Back    Number of allergen test 43    Panel 2 Select    Control-Histamine 1 mg/ml 2+    1. Peanut Negative    3. Wheat  Negative    5. Milk, cow Negative    6. Egg White, Chicken Negative    8. Shellfish Mix Negative    9. Fish Mix Negative    10. Cashew Negative    11. Pecan Food Negative    12. [place] Negative    13. Almond Negative    14. Hazelnut Negative    15. [place] nut Negative    16. Coconut Negative    17. Pistachio Negative    25. Shrimp Negative    26. Crab Negative    31. Oat  Negative    34. Rice Negative    37. Pork Negative    40. Beef Negative    42. Tomato Negative    43. White Potato Negative    44. Sweet Potato Negative    45. Pea, Green/English Negative    49. Onion Negative    50. Cabbage Negative    51. Carrots Negative    52. Celery Negative    53. Corn Negative    54. Cucumber Negative    55. Grape (White seedless) Negative    56. Orange  Negative    57. Banana Negative    58. Apple Negative    59. Peach Negative    60. Strawberry Negative    61. Cantaloupe Negative    63. Pineapple Negative    64. Chocolate/Cacao bean Negative    67. Cinnamon Negative    70. Garlic Negative    71. Pepper, black Negative    72. Mustard Negative           Allergy testing results were read and interpreted by myself, documented by clinical staff.         Salvatore Marvel, MD [facility]

## 2019-11-26 ENCOUNTER — Encounter: Payer: Self-pay | Admitting: Allergy & Immunology

## 2019-12-01 ENCOUNTER — Ambulatory Visit (HOSPITAL_COMMUNITY)
Admission: RE | Admit: 2019-12-01 | Discharge: 2019-12-01 | Disposition: A | Discharge status: Home / Self Care | Payer: Medicare Other | Source: Ambulatory Visit | Attending: General Surgery | Admitting: General Surgery

## 2019-12-01 ENCOUNTER — Other Ambulatory Visit: Payer: Self-pay

## 2019-12-01 ENCOUNTER — Other Ambulatory Visit: Payer: Self-pay | Admitting: General Surgery

## 2019-12-01 DIAGNOSIS — K869 Disease of pancreas, unspecified: Secondary | ICD-10-CM | POA: Diagnosis not present

## 2019-12-01 IMAGING — MR MR ABDOMEN WO/W CM
20 of 22 series · 43 of 48 positions shown · IV contrast (gadavist)
Comparison: CT abdomen pelvis, [DATE]

CLINICAL DATA: Right-sided abdominal pain, characterize pancreatic
lesion identified by prior CT

EXAM:
MRI ABDOMEN WITHOUT AND WITH CONTRAST
TECHNIQUE: Multiplanar multisequence MR imaging of the abdomen was performed
both before and after the administration of intravenous contrast.
CONTRAST:  10mL GADAVIST GADOBUTROL 1 MMOL/ML IV SOLN

[Series 3: cor haste · coronal · 6.0mm · 1.25mm/px · 1 of 38 slices shown]
[im 1/38]
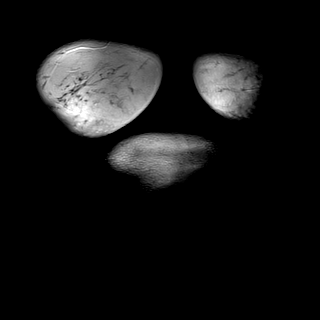

[Series 4: ax haste · axial · 6.0mm · 1.38mm/px · 1 of 32 slices shown]
[im 1/32]
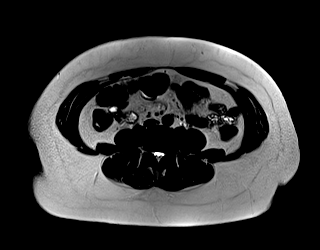

[Series 5: T2 fat-sat · axial · 6.0mm · 1.19mm/px · 1 of 34 slices shown]
[im 1/34]
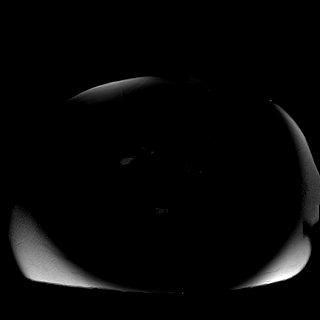

[Series 8: t2_space_[id number]_320_iso · coronal · 1.3mm · 0.59mm/px · 3 of 110 slices shown]
[im 1/110]
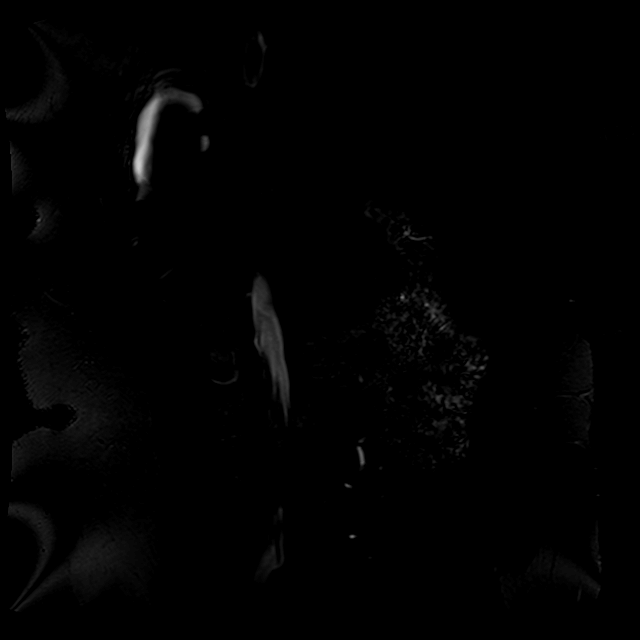
[im 55/110]
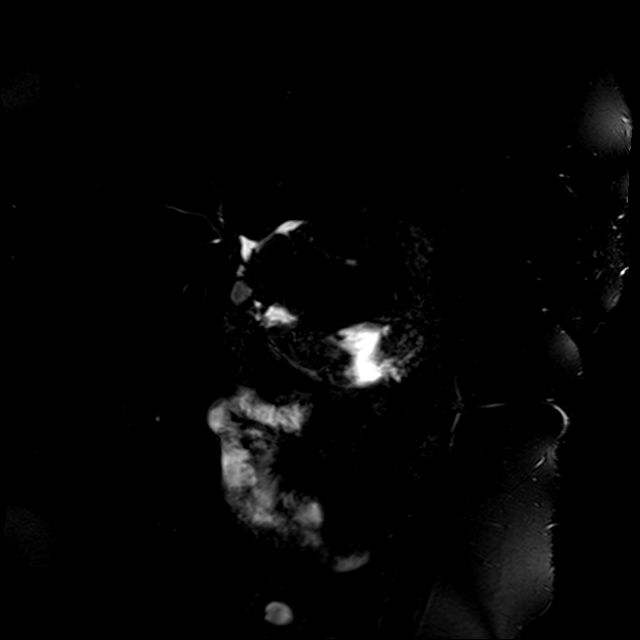
[im 110/110]
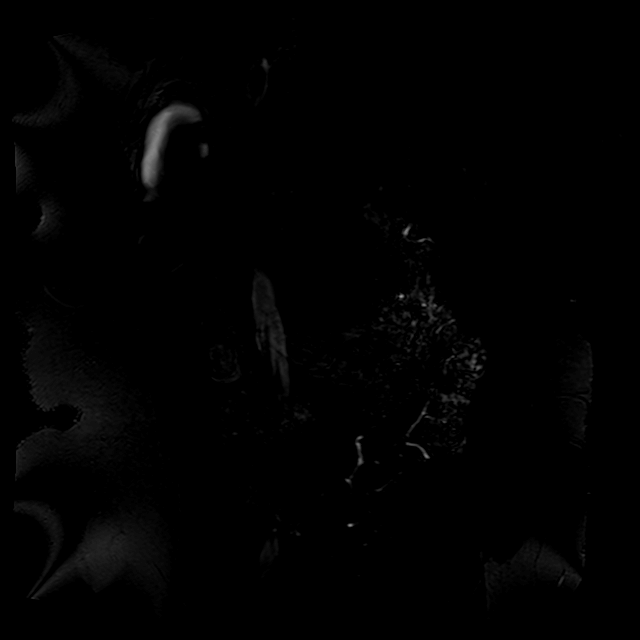

[Series 10: DWI · axial · 6.0mm · 1.64mm/px · 1 of 42 slices shown (1 of 4)]
[im 1/42]
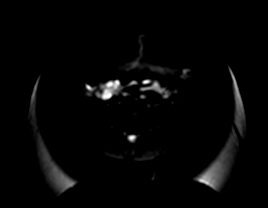

[Series 10: DWI · axial · 6.0mm · 1.64mm/px · 1 of 42 slices shown (2 of 4)]
[im 1/42]
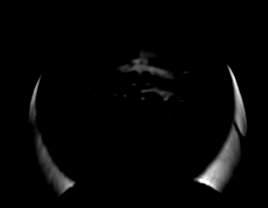

[Series 10: DWI · axial · 6.0mm · 1.64mm/px · 1 of 42 slices shown (3 of 4)]
[im 1/42]
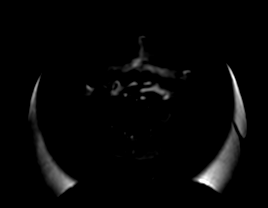

[Series 11: DWI · axial · 6.0mm · 1.64mm/px · z∈[-82,+213]mm · 2 of 42 slices shown (4 of 4)]
[im 1/42]
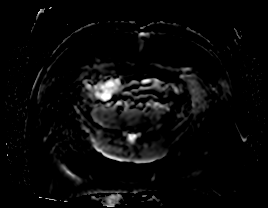
[im 42/42]
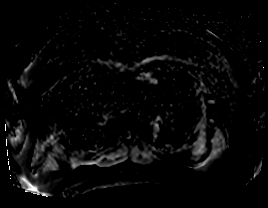

[Series 12: bSSFP · axial · 6.0mm · 0.86mm/px · 1 of 40 slices shown]
[im 1/40]
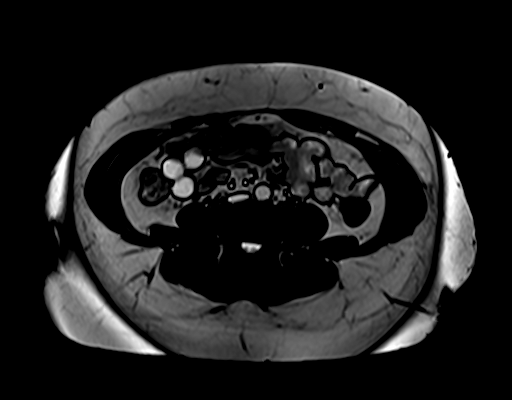

[Series 13: ax in and · axial · 3.0mm · 1.19mm/px · z∈[-45,+168]mm · 3 of 72 slices shown (1 of 2)]
[im 1/72]
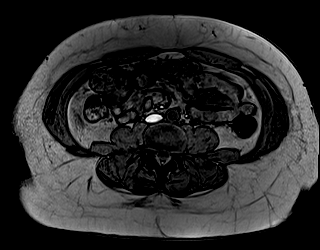
[im 36/72]
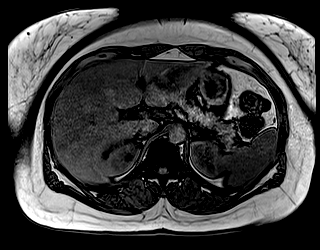
[im 72/72]
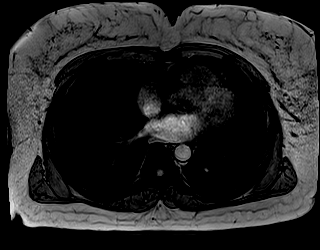

[Series 14: ax in and · axial · 3.0mm · 1.19mm/px · z∈[-45,+168]mm · 3 of 72 slices shown (2 of 2)]
[im 1/72]
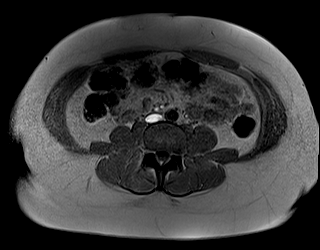
[im 36/72]
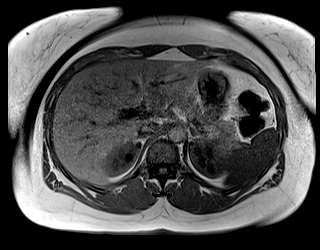
[im 72/72]
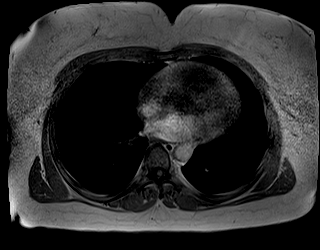

[Series 15: T1 dynamic · axial · non-contrast · 3.0mm · 1.19mm/px · z∈[-43,+170]mm · 3 of 72 slices shown (1 of 4)]
[im 1/72]
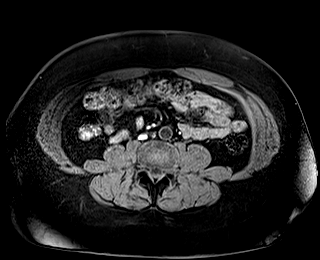
[im 36/72]
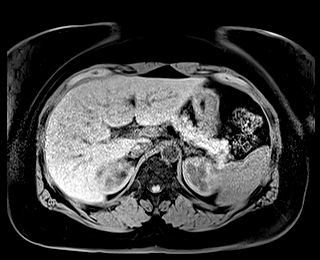
[im 72/72]
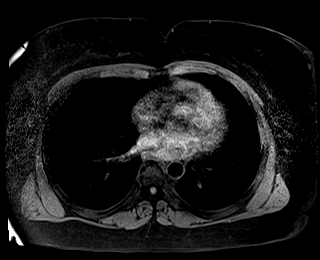

[Series 17: T1 dynamic post-contrast · axial · 3.0mm · 1.19mm/px · z∈[-43,+170]mm · 3 of 72 slices shown (1 of 5)]
[im 1/72]
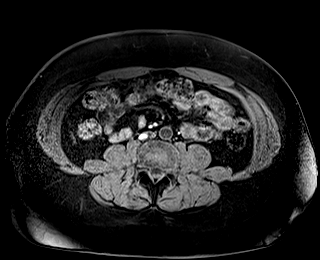
[im 36/72]
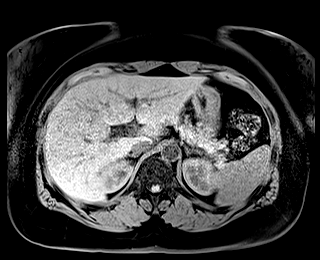
[im 72/72]
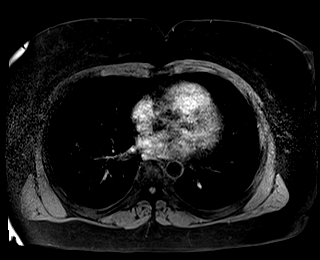

[Series 18: T1 dynamic · axial · 3.0mm · 1.19mm/px · z∈[-43,+170]mm · 3 of 72 slices shown (2 of 4)]
[im 1/72]
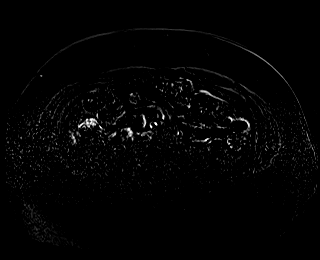
[im 36/72]
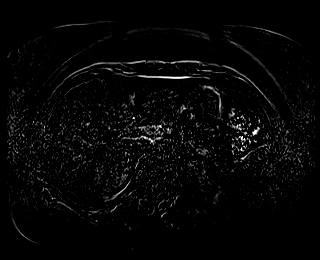
[im 72/72]
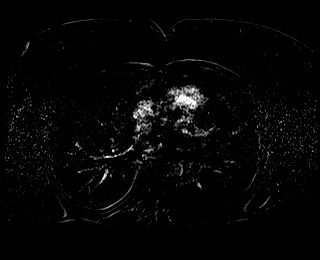

[Series 19: T1 dynamic post-contrast · axial · 3.0mm · 1.19mm/px · z∈[-43,+170]mm · 3 of 72 slices shown (2 of 5)]
[im 1/72]
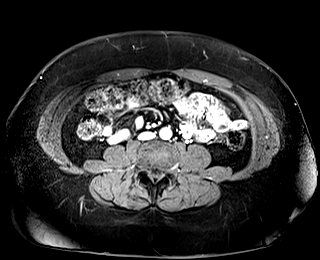
[im 36/72]
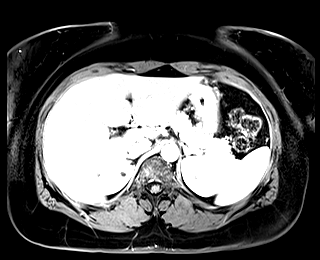
[im 72/72]
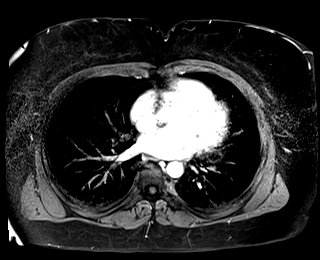

[Series 20: T1 dynamic · axial · 3.0mm · 1.19mm/px · z∈[-43,+170]mm · 3 of 72 slices shown (3 of 4)]
[im 1/72]
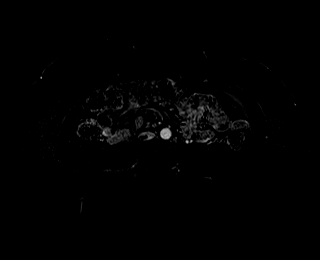
[im 36/72]
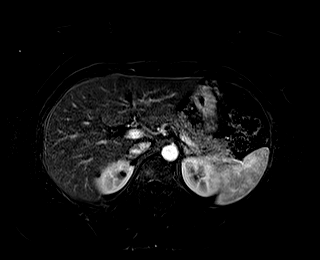
[im 72/72]
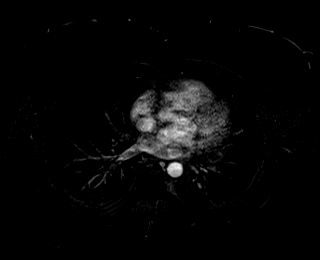

[Series 21: T1 dynamic post-contrast · axial · 3.0mm · 1.19mm/px · z∈[-43,+170]mm · 3 of 72 slices shown (3 of 5)]
[im 1/72]
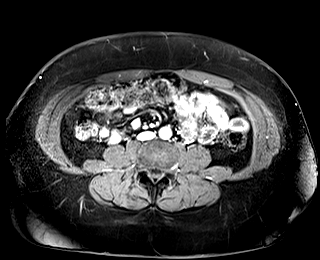
[im 36/72]
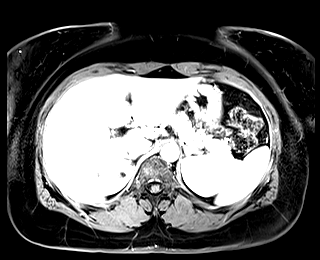
[im 72/72]
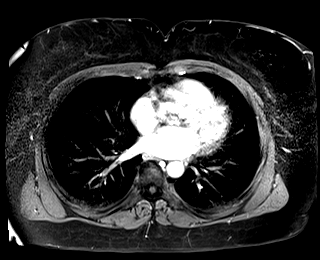

[Series 22: T1 dynamic · axial · 3.0mm · 1.19mm/px · z∈[-43,+170]mm · 3 of 72 slices shown (4 of 4)]
[im 1/72]
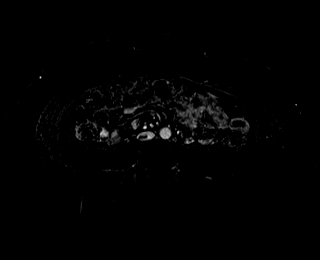
[im 36/72]
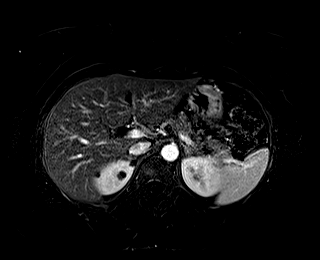
[im 72/72]
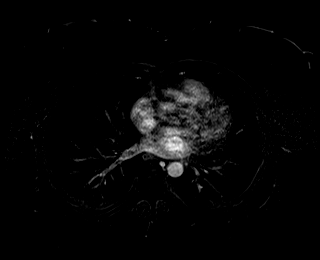

[Series 23: T1 dynamic post-contrast · coronal · 3.0mm · 1.19mm/px · 2 of 64 slices shown (4 of 5)]
[im 1/64]
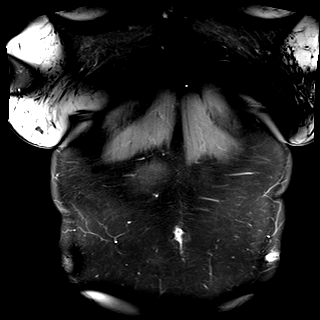
[im 64/64]
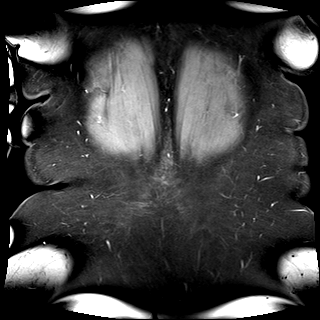

[Series 24: T1 dynamic post-contrast · axial · 3.0mm · 1.19mm/px · z∈[-43,+62]mm · 2 of 72 slices shown (5 of 5)]
[im 1/72]
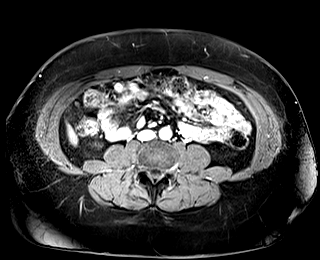
[im 36/72]
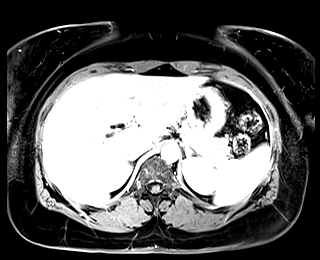

[43 of 48 positions shown; findings below may reference images not displayed]

FINDINGS: Lower chest: No acute findings.

Hepatobiliary: Hepatic steatosis. Small lobulated simple cyst of the
anterior left lobe of the liver. No mass or other parenchymal
abnormality identified. Mild dilatation of the common bile duct
measuring up to 0.9 cm. No calculi or other obstructing lesion to
the ampulla.

Pancreas: There is a cystic lesion of the pancreatic uncinate with a
small communication to the main pancreatic duct, measuring 1.3 x
cm (series 8, image 44). No pancreatic ductal dilatation. No solid
mass, inflammatory changes, or other parenchymal abnormality
identified.

Spleen:  Within normal limits in size and appearance.

Adrenals/Urinary Tract: No masses identified. Small simple cysts of
the superior pole of the right kidney. No evidence of
hydronephrosis.

Stomach/Bowel: Visualized portions within the abdomen are
unremarkable.

Vascular/Lymphatic: No pathologically enlarged lymph nodes
identified. No abdominal aortic aneurysm demonstrated.

Other:  None.

Musculoskeletal: No suspicious bone lesions identified.
IMPRESSION: 1. There is a 1.3 x 0.8 cm cystic lesion of the pancreatic uncinate
with a small communication to the main pancreatic duct. Findings
most consistent with a small intraductal papillary mucinous
neoplasm. Recommend follow-up contrast enhanced MRI at 1 year to
establish stability. This recommendation follows ACR white paper
guidelines on management of incidental pancreatic cysts.
2. Mild dilatation of the common bile duct measuring up to 0.9 cm,
of doubtful clinical significance. No calculi or other obstructing
lesion to the ampulla.
3. Hepatic steatosis.

## 2019-12-01 MED ORDER — GADOBUTROL 1 MMOL/ML IV SOLN
10.0000 mL | Freq: Once | INTRAVENOUS | Status: AC | PRN
  Administered 2019-12-01: 10 mL via INTRAVENOUS

## 2019-12-02 ENCOUNTER — Telehealth (INDEPENDENT_AMBULATORY_CARE_PROVIDER_SITE_OTHER): Payer: Medicare Other | Admitting: General Surgery

## 2019-12-02 DIAGNOSIS — D490 Neoplasm of unspecified behavior of digestive system: Secondary | ICD-10-CM | POA: Insufficient documentation

## 2019-12-02 DIAGNOSIS — D378 Neoplasm of uncertain behavior of other specified digestive organs: Secondary | ICD-10-CM

## 2019-12-02 NOTE — Telephone Encounter (Signed)
[facility]  Patient s/p lap appendectomy and findings of incidental pancreatic cyst on CT. MRI done outpatient.  Called patient to discuss MRI.  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 Discussed that this is a benign etiology but could transform to malignant.   Will discussed with a surgical oncologist and ensure that they are ok with [place] getting annual MRI versus referring to see them now to establish surveillance. She would want [place] as she lives in [place] if needed.   Curlene Labrum, MD [facility] [place], [place] [address] [phone] (office)   CLINICAL DATA:  Right-sided abdominal pain, characterize pancreatic lesion identified by prior CT  EXAM: MRI ABDOMEN WITHOUT AND WITH CONTRAST  TECHNIQUE: Multiplanar multisequence MR imaging of the abdomen was performed both before and after the administration of intravenous contrast.  CONTRAST:  75mL GADAVIST GADOBUTROL 1 MMOL/ML IV SOLN  COMPARISON:  CT abdomen pelvis, 11/05/2019  FINDINGS: Lower chest: No acute findings.  Hepatobiliary: Hepatic steatosis. Small lobulated simple cyst of the anterior left lobe of the liver. No mass or other parenchymal abnormality identified. Mild dilatation of the common bile duct measuring up to 0.9 cm. No calculi or other obstructing lesion to the ampulla.  Pancreas: There is a cystic lesion of the pancreatic uncinate with a small communication to the main pancreatic duct, measuring 1.3 x 0.8 cm (series 8, image 44). No pancreatic ductal dilatation. No solid mass, inflammatory changes, or other parenchymal abnormality identified.  Spleen:  Within normal limits in size and appearance.  Adrenals/Urinary Tract: No masses identified. Small simple cysts of the superior pole of the right kidney. No evidence of hydronephrosis.  Stomach/Bowel:  Visualized portions within the abdomen are unremarkable.  Vascular/Lymphatic: No pathologically enlarged lymph nodes identified. No abdominal aortic aneurysm demonstrated.  Other:  None.  Musculoskeletal: No suspicious bone lesions identified.  IMPRESSION: 1. There is a 1.3 x 0.8 cm cystic lesion of the pancreatic uncinate with a small communication to the main pancreatic duct. 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 Recommend follow-up contrast enhanced MRI at 1 year to establish stability. This recommendation follows ACR white paper guidelines on management of incidental pancreatic cysts. 2. Mild dilatation of the common bile duct measuring up to 0.9 cm, of doubtful clinical significance. No calculi or other obstructing lesion to the ampulla. 3. Hepatic steatosis.   Electronically Signed   By: Eddie Candle M.D.   On: 12/01/2019 10:07

## 2019-12-03 ENCOUNTER — Other Ambulatory Visit: Payer: Self-pay

## 2019-12-03 ENCOUNTER — Other Ambulatory Visit (INDEPENDENT_AMBULATORY_CARE_PROVIDER_SITE_OTHER): Payer: Medicare Other

## 2019-12-03 DIAGNOSIS — R001 Bradycardia, unspecified: Secondary | ICD-10-CM

## 2019-12-03 NOTE — Addendum Note (Signed)
Addended by: Levonne Hubert on: 12/03/2019 10:49 AM   Modules accepted: Orders

## 2019-12-07 NOTE — Telephone Encounter (Signed)
Referral placed to [facility] - all notes and demographics faxed to [facility] oncology [phone] with confirmation.

## 2019-12-16 ENCOUNTER — Telehealth: Payer: Self-pay | Admitting: Cardiology

## 2019-12-16 ENCOUNTER — Telehealth (INDEPENDENT_AMBULATORY_CARE_PROVIDER_SITE_OTHER): Payer: Medicare Other | Admitting: General Surgery

## 2019-12-16 ENCOUNTER — Telehealth: Payer: Self-pay | Admitting: Family Medicine

## 2019-12-16 ENCOUNTER — Encounter (INDEPENDENT_AMBULATORY_CARE_PROVIDER_SITE_OTHER): Payer: Medicare Other | Admitting: General Surgery

## 2019-12-16 DIAGNOSIS — D490 Neoplasm of unspecified behavior of digestive system: Secondary | ICD-10-CM

## 2019-12-16 DIAGNOSIS — D378 Neoplasm of uncertain behavior of other specified digestive organs: Secondary | ICD-10-CM

## 2019-12-16 NOTE — Telephone Encounter (Signed)
Faxed over letter to Dr. Shon Hough at [phone] with confirmation.

## 2019-12-16 NOTE — Telephone Encounter (Signed)
Pt returned call and states that she is much better. Informed pt that if she has CP with SOB she needs to go to the ER. Pt verbalized understanding.

## 2019-12-16 NOTE — Telephone Encounter (Signed)
New message   Patient is calling about her monitor results.  She called her PCP to get them

## 2019-12-16 NOTE — Telephone Encounter (Signed)
Pt called [facility] stating that she was having sharp pain in her stomach area where her incisions are and she is having HA's with some SOB.   Called pt back and had to leave a message on vm to return my call.

## 2019-12-16 NOTE — Telephone Encounter (Signed)
Spoke with pt. Informed that result are not available at this time. Will notify as soon a they are available. Pt voiced understanding.

## 2019-12-16 NOTE — Telephone Encounter (Signed)
Pt has apt with Kara Jimenez. Zenia Resides, MD ([facility]) on 12/18/2019 at [place].

## 2019-12-16 NOTE — Progress Notes (Signed)
Dr. Zenia Resides,  [name] is a 59 year old that I performed a laparoscopic appendectomy on 11/05/2019 for acute appendicitis.  She had an incidentally found 13mm lesion on her pancreas on her CT for her appendicitis.    She had some intraoperative bradycardia to the 20s and stayed overnight for monitoring. She had a cardiac monitor as an outpatient, and this revealed noting concerning per her report to me.   Otherwise, she did well post operatively, and I ordered an MRI for follow up on her pancreas.  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  I have referred her for surveillance and further expertise in the area. I appreciate your assistance.  I have had her CT from 11/05/2019 and her MRI from 12/01/2019 pushed through the system to [place], so the images should be available to prior to her appointment on 12/18/2019.  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   Sincerely, Curlene Labrum, MD [facility] [address] [place], [place] [address] [phone] (office)

## 2019-12-16 NOTE — Telephone Encounter (Signed)
Dr. Zenia Resides,  Kara Jimenez is a 60 year old that I performed a laparoscopic appendectomy on 11/05/2019 for acute appendicitis.  She had an incidentally found 12mm lesion on her pancreas on her CT for her appendicitis.    She had some intraoperative bradycardia to the 20s and stayed overnight for monitoring. She had a cardiac monitor as an outpatient, and this revealed noting concerning per her report to me.   Otherwise, she did well post operatively, and I ordered an MRI for follow up on her pancreas.  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  I have referred her for surveillance and further expertise in the area. I appreciate your assistance.  I have had her CT from 11/05/2019 and her MRI from 12/01/2019 pushed through the system to [place], so the images should be available to prior to her appointment on 12/18/2019.  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   Sincerely, Curlene Labrum, MD [facility] [address] [place], [place] [address] [phone] (office)

## 2020-03-10 ENCOUNTER — Other Ambulatory Visit (HOSPITAL_COMMUNITY): Payer: Self-pay | Admitting: Family Medicine

## 2020-03-10 DIAGNOSIS — Z1231 Encounter for screening mammogram for malignant neoplasm of breast: Secondary | ICD-10-CM

## 2020-03-31 ENCOUNTER — Other Ambulatory Visit (HOSPITAL_COMMUNITY): Payer: Self-pay | Admitting: Family Medicine

## 2020-03-31 DIAGNOSIS — E010 Iodine-deficiency related diffuse (endemic) goiter: Secondary | ICD-10-CM

## 2020-04-04 ENCOUNTER — Encounter: Payer: Self-pay | Admitting: Internal Medicine

## 2020-04-07 ENCOUNTER — Ambulatory Visit (HOSPITAL_COMMUNITY)
Admission: RE | Admit: 2020-04-07 | Discharge: 2020-04-07 | Disposition: A | Discharge status: Home / Self Care | Payer: Medicare Other | Source: Ambulatory Visit | Attending: Family Medicine | Admitting: Family Medicine

## 2020-04-07 ENCOUNTER — Other Ambulatory Visit: Payer: Self-pay

## 2020-04-07 DIAGNOSIS — E010 Iodine-deficiency related diffuse (endemic) goiter: Secondary | ICD-10-CM | POA: Diagnosis not present

## 2020-04-07 IMAGING — US US THYROID
1 series · 13 of 25 positions shown · non-contrast
Comparison: None.

CLINICAL DATA: Thyromegaly

EXAM:
THYROID ULTRASOUND
TECHNIQUE: Ultrasound examination of the thyroid gland and adjacent soft
tissues was performed.

[Series 1: us thyroid · 13 of 46 slices shown]
[im 1/46]
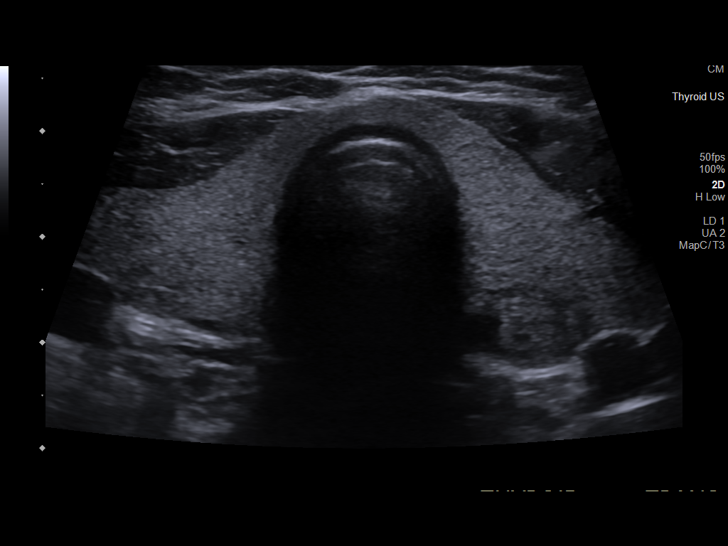
[im 4/46]
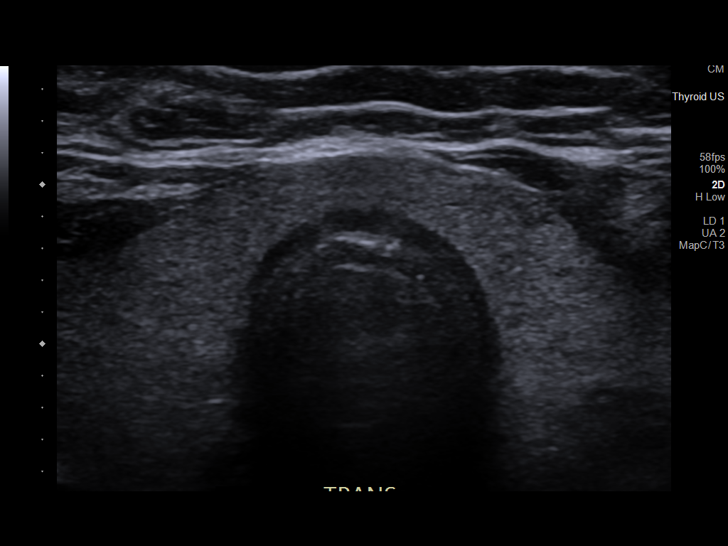
[im 8/46]
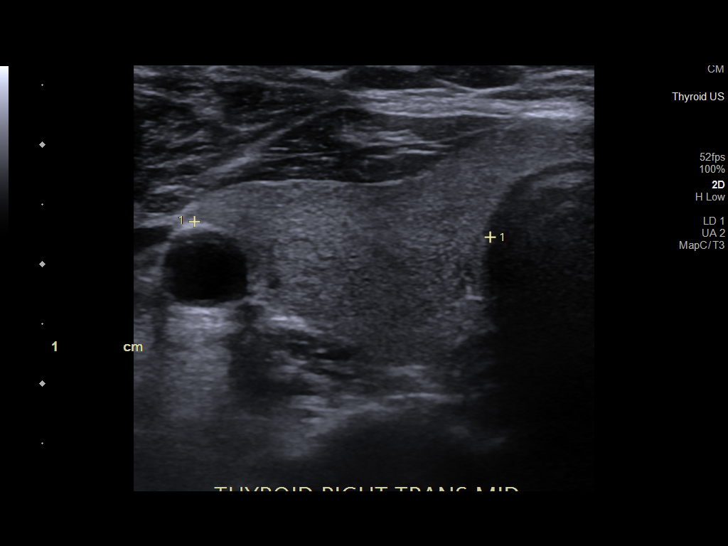
[im 12/46]
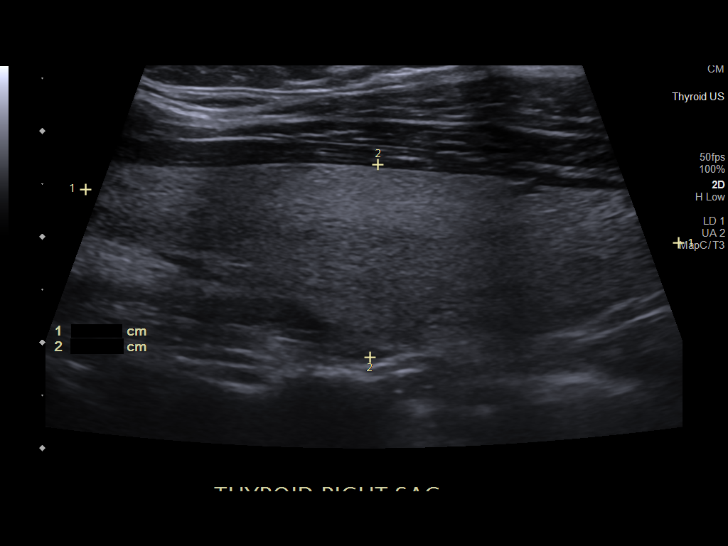
[im 16/46]
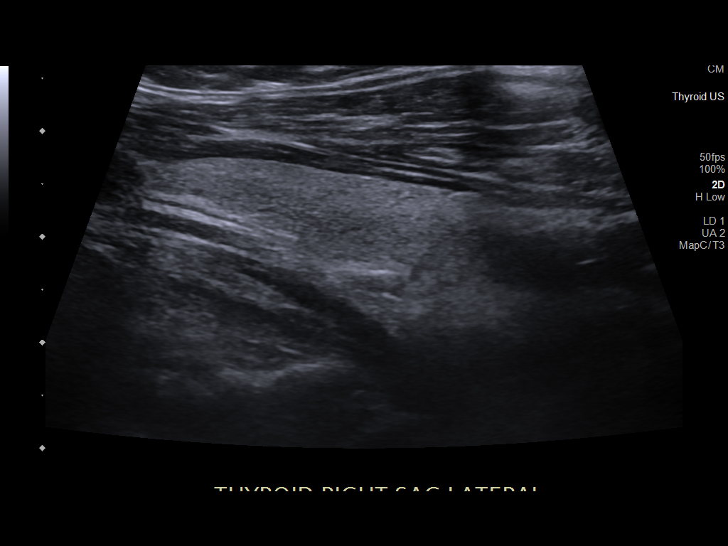
[im 19/46]
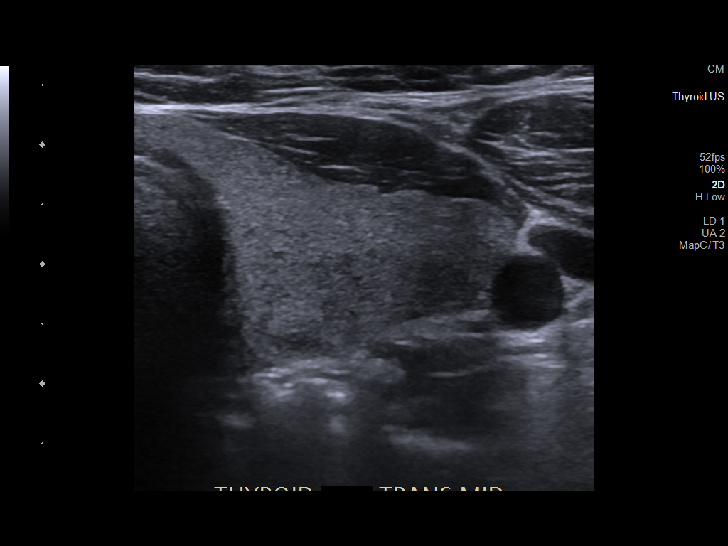
[im 23/46]
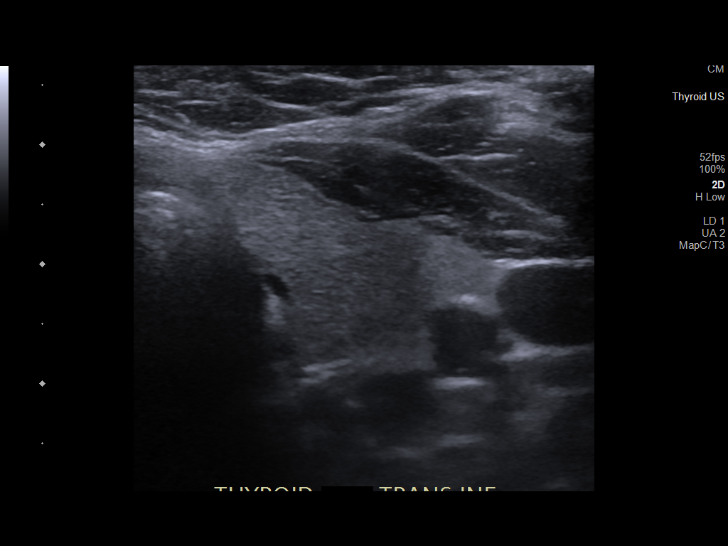
[im 27/46]
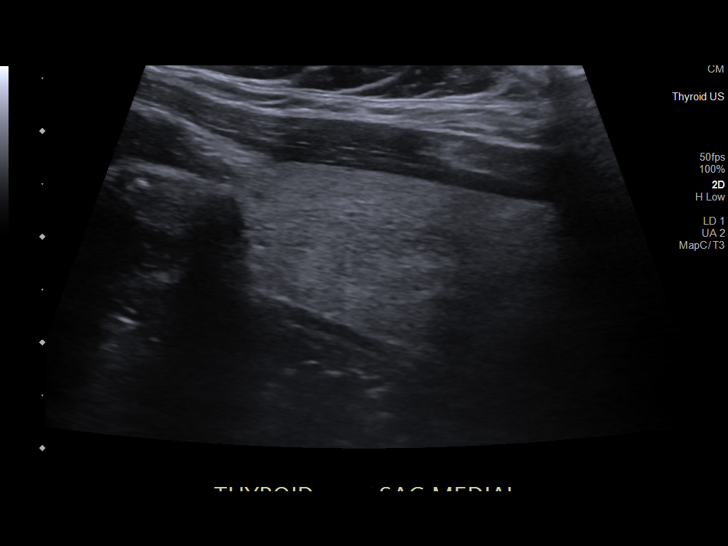
[im 31/46]
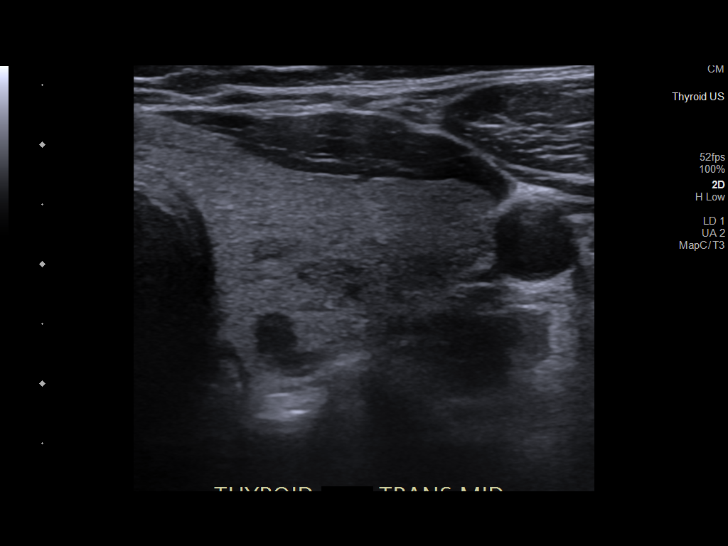
[im 34/46]
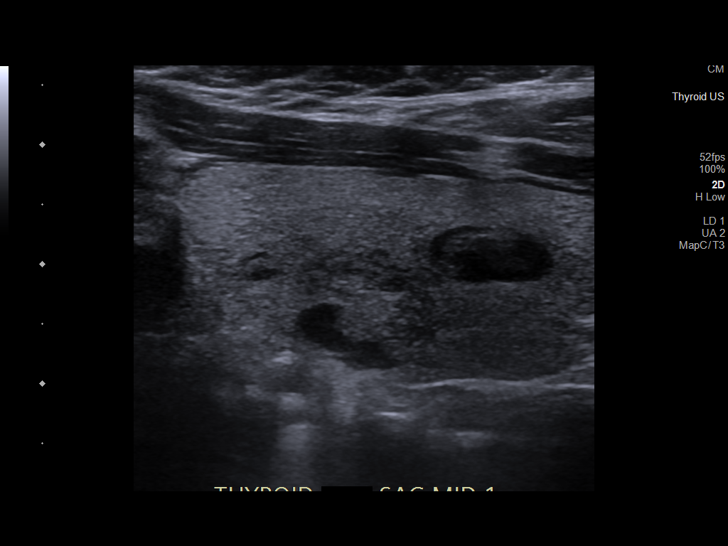
[im 38/46]
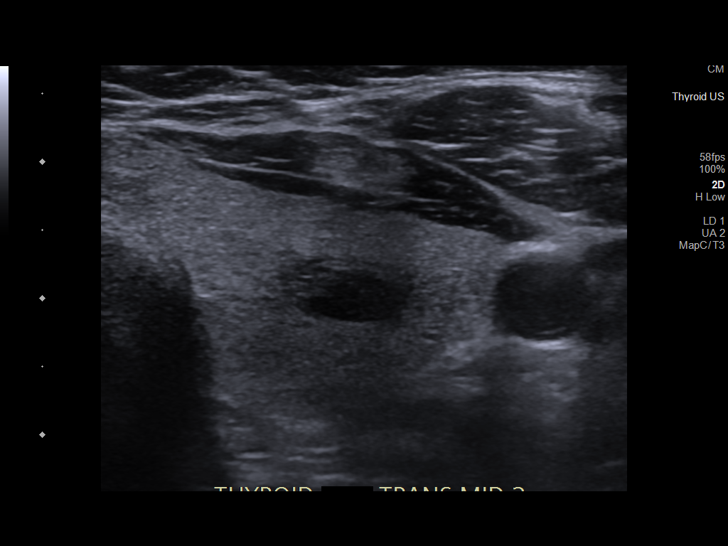
[im 42/46]
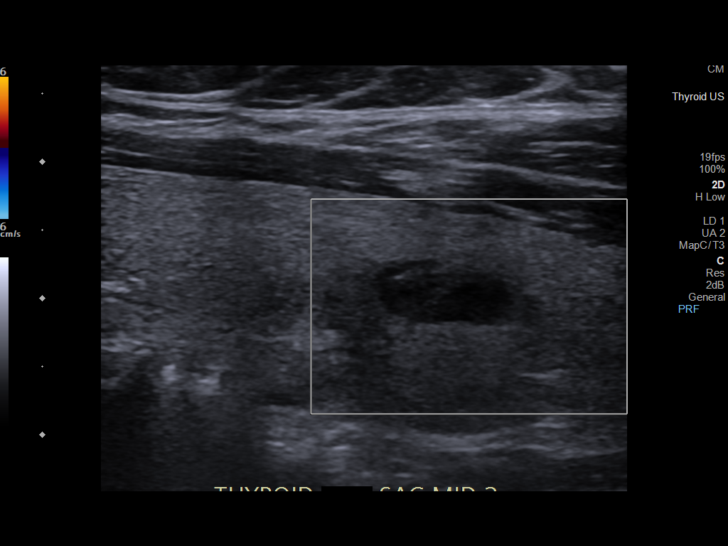
[im 46/46]
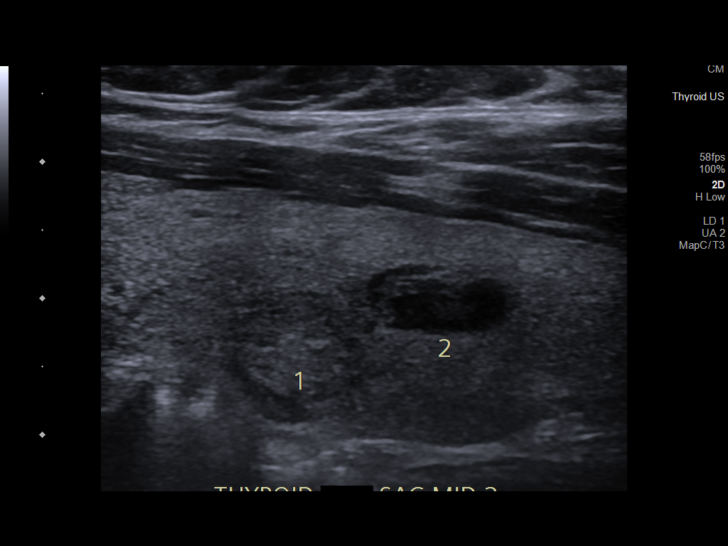

[13 of 25 positions shown; findings below may reference images not displayed]

FINDINGS: Parenchymal Echotexture: Mildly heterogeneous

Isthmus: 0.3 cm

Right lobe: 5.6 x 1.8 x 2.5 cm

Left lobe: 5.0 x 1.8 x 2.4 cm

_________________________________________________________

Estimated total number of nodules >/= 1 cm: 2

Number of spongiform nodules >/=  2 cm not described below (TR1): 0

Number of mixed cystic and solid nodules >/= 1.5 cm not described
below (TR2): 0

_________________________________________________________

Nodule # 1:

Location: Left; mid

Maximum size: 1.3 cm; Other 2 dimensions: 1.2 x 0.8 cm

Composition: solid/almost completely solid (2)

Echogenicity: isoechoic (1)

Shape: not taller-than-wide (0)

Margins: smooth (0)

Echogenic foci: none (0)

ACR TI-RADS total points: 3.

ACR TI-RADS risk category: TR3 (3 points).

ACR TI-RADS recommendations:

Given size (<1.4 cm) and appearance, this nodule does NOT meet
TI-RADS criteria for biopsy or dedicated follow-up.

_________________________________________________________

Nodule # 2: 1.1 x 1.0 x 0.5 cm predominantly cystic left thyroid
nodule does not meet criteria for FNA or imaging follow-up.
IMPRESSION: Two left thyroid nodules which do not meet criteria for FNA or
imaging follow-up.

The above is in keeping with the ACR TI-RADS recommendations - [HOSPITAL] [4G];[DATE].

## 2020-04-29 ENCOUNTER — Other Ambulatory Visit: Payer: Self-pay

## 2020-04-29 ENCOUNTER — Ambulatory Visit (INDEPENDENT_AMBULATORY_CARE_PROVIDER_SITE_OTHER): Payer: Medicare Other | Admitting: Cardiology

## 2020-04-29 ENCOUNTER — Encounter: Payer: Self-pay | Admitting: Cardiology

## 2020-04-29 VITALS — BP 126/70 | HR 57 | Ht 66.0 in | Wt 233.0 lb

## 2020-04-29 DIAGNOSIS — R011 Cardiac murmur, unspecified: Secondary | ICD-10-CM | POA: Diagnosis not present

## 2020-04-29 DIAGNOSIS — I10 Essential (primary) hypertension: Secondary | ICD-10-CM

## 2020-04-29 NOTE — Patient Instructions (Signed)

## 2020-04-29 NOTE — Progress Notes (Signed)
Cardiology Office Note:    Date:  04/29/2020   ID:  Kara Jimenez, DOB [birth year], MRN [id number]  PCP:  Alfonse Flavors, MD   [place]  Cardiologist:  Kate Sable, MD  Advanced Practice Provider:  No care team member to display Electrophysiologist:  None       Referring MD: Alfonse Flavors,*   Chief Complaint  Patient presents with  . New Patient (Initial Visit)    Referred by PCP for Hx of fibromyalgia, HTN, Systolic murmur, intermittent episodes of dizziness and worsening fatigue. Meds reviewed verbally with patient.     History of Present Illness:    Kara Jimenez is a 60 y.o. female with a hx of hypertension, fibromyalgia, OSA/CPAP nightly who presents due to a systolic murmur.  Patient saw her primary care provider where systolic murmur was noted on exam.  Denies any history of heart disease, notes having palpitations in the past, cardiac monitor at the time did not show any significant arrhythmias.  She has occasional muscle spasms, neck/right shoulder pain with radiculopathy, generalized swelling which has been associated with fibromyalgia.  Past Medical History:  Diagnosis Date  . Arthritis   . Breast injury   . Carpal tunnel syndrome   . Fibromyalgia   . Sciatica   . Sleep apnea     Past Surgical History:  Procedure Laterality Date  . BREAST EXCISIONAL BIOPSY Right    benign  . CARPAL TUNNEL RELEASE Bilateral   . CESAREAN SECTION    . LAPAROSCOPIC APPENDECTOMY N/A 11/05/2019   Procedure: APPENDECTOMY LAPAROSCOPIC;  Surgeon: Kara Cagey, MD;  Location: [facility];  Service: General;  Laterality: N/A;  . NECK SURGERY    . PARTIAL HYSTERECTOMY    . ROTATOR CUFF REPAIR Left   . TUBAL LIGATION      Current Medications: Current Meds  Medication Sig  . acetaminophen (TYLENOL) 500 MG tablet Take 500 mg by mouth every 6 (six) hours as needed.  . Brinzolamide-Brimonidine (SIMBRINZA) 1-0.2 % SUSP Apply 1  drop to eye daily.   Marland Kitchen lisinopril (ZESTRIL) 5 MG tablet Take 5 mg by mouth daily.  Marland Kitchen loratadine (CLARITIN) 10 MG tablet Take 10 mg by mouth daily.  Marland Kitchen tiZANidine (ZANAFLEX) 2 MG tablet Take 2 mg by mouth at bedtime.      Allergies:   Amitriptyline, Duloxetine, Gabapentin, Pregabalin, Sertraline, Hydrochlorothiazide, Baclofen, Ibuprofen, Prednisone, and Amlodipine   Social History   Socioeconomic History  . Marital status: Single    Spouse name: Not on file  . Number of children: Not on file  . Years of education: Not on file  . Highest education level: Not on file  Occupational History  . Not on file  Tobacco Use  . Smoking status: Former Research scientist (life sciences)  . Smokeless tobacco: Never Used  Vaping Use  . Vaping Use: Never used  Substance and Sexual Activity  . Alcohol use: Not Currently  . Drug use: Yes    Types: Marijuana  . Sexual activity: Not on file  Other Topics Concern  . Not on file  Social History Narrative  . Not on file   Social Determinants of Health   Financial Resource Strain: Not on file  Food Insecurity: Not on file  Transportation Needs: Not on file  Physical Activity: Not on file  Stress: Not on file  Social Connections: Not on file     Family History: The patient's family history includes HIV/AIDS in her mother; Hypertension in  her father.  ROS:   Please see the history of present illness.     All other systems reviewed and are negative.  EKGs/Labs/Other Studies Reviewed:    The following studies were reviewed today:   EKG:  EKG is  ordered today.  The ekg ordered today demonstrates sinus bradycardia, first-degree AV block, otherwise normal ECG  Recent Labs: 11/05/2019: ALT 16; TSH 3.002 11/06/2019: Hemoglobin 12.0; Platelets 195 11/07/2019: BUN 14; Creatinine, Ser 0.85; Magnesium 2.1; Potassium 3.4; Sodium 141  Recent Lipid Panel No results found for: CHOL, TRIG, HDL, CHOLHDL, VLDL, LDLCALC, LDLDIRECT   Risk Assessment/Calculations:       Physical Exam:    VS:  BP 126/70 (BP Location: Left Arm, Patient Position: Sitting, Cuff Size: Normal)   Pulse (!) 57   Ht 5\' 6"  (1.676 m)   Wt 233 lb (105.7 kg)   LMP 04/15/2019   SpO2 99%   BMI 37.61 kg/m     Wt Readings from Last 3 Encounters:  04/29/20 233 lb (105.7 kg)  11/25/19 224 lb (101.6 kg)  11/19/19 227 lb (103 kg)     GEN:  Well nourished, well developed in no acute distress HEENT: Normal NECK: No JVD; No carotid bruits LYMPHATICS: No lymphadenopathy CARDIAC: RRR, occasional faint systolic murmur noted RESPIRATORY:  Clear to auscultation without rales, wheezing or rhonchi  ABDOMEN: Soft, non-tender, non-distended MUSCULOSKELETAL:  Trace non pitting edema; No deformity  SKIN: Warm and dry NEUROLOGIC:  Alert and oriented x 3 PSYCHIATRIC:  Normal affect   ASSESSMENT:    1. Heart murmur, systolic   2. Primary hypertension    PLAN:    In order of problems listed above:  1. A faint systolic murmur on exam.  Obtain echo to evaluate any significant valvular  or structural abnormalities. 2. Hypertension, BP controlled.  Continue lisinopril.  Follow-up after echocardiogram     Medication Adjustments/Labs and Tests Ordered: Current medicines are reviewed at length with the patient today.  Concerns regarding medicines are outlined above.  Orders Placed This Encounter  Procedures  . EKG 12-Lead  . ECHOCARDIOGRAM COMPLETE   No orders of the defined types were placed in this encounter.   Patient Instructions  Medication Instructions:  Your physician recommends that you continue on your current medications as directed. Please refer to the Current Medication list given to you today.  *If you need a refill on your cardiac medications before your next appointment, please call your pharmacy*   Lab Work: None ordered If you have labs (blood work) drawn today and your tests are completely normal, you will receive your results only by: Marland Kitchen MyChart Message  (if you have MyChart) OR . A paper copy in the mail If you have any lab test that is abnormal or we need to change your treatment, we will call you to review the results.   Testing/Procedures:  1.  Your physician has requested that you have an echocardiogram. Echocardiography is a painless test that uses sound waves to create images of your heart. It provides your doctor with information about the size and shape of your heart and how well your heart's chambers and valves are working. This procedure takes approximately one hour. There are no restrictions for this procedure.     Follow-Up: At [facility], you and your health needs are our priority.  As part of our continuing mission to provide you with exceptional heart care, we have created designated Provider Care Teams.  These Care Teams include your primary  Cardiologist (physician) and Advanced Practice Providers (APPs -  Physician Assistants and Nurse Practitioners) who all work together to provide you with the care you need, when you need it.  We recommend signing up for the patient portal called "MyChart".  Sign up information is provided on this After Visit Summary.  MyChart is used to connect with patients for Virtual Visits (Telemedicine).  Patients are able to view lab/test results, encounter notes, upcoming appointments, etc.  Non-urgent messages can be sent to your provider as well.   To learn more about what you can do with MyChart, go to [email or web address].    Your next appointment:   Follow up after Echo   The format for your next appointment:   In Person  Provider:   Kate Sable, MD   Other Instructions       Signed, Kate Sable, MD  04/29/2020 1:01 PM    [place]

## 2020-05-04 ENCOUNTER — Encounter: Payer: Self-pay | Admitting: Nurse Practitioner

## 2020-05-04 ENCOUNTER — Encounter: Payer: Self-pay | Admitting: *Deleted

## 2020-05-04 ENCOUNTER — Other Ambulatory Visit: Payer: Self-pay

## 2020-05-04 ENCOUNTER — Ambulatory Visit (INDEPENDENT_AMBULATORY_CARE_PROVIDER_SITE_OTHER): Payer: Medicare Other | Admitting: Nurse Practitioner

## 2020-05-04 ENCOUNTER — Telehealth: Payer: Self-pay | Admitting: *Deleted

## 2020-05-04 DIAGNOSIS — K625 Hemorrhage of anus and rectum: Secondary | ICD-10-CM

## 2020-05-04 DIAGNOSIS — R1030 Lower abdominal pain, unspecified: Secondary | ICD-10-CM | POA: Diagnosis not present

## 2020-05-04 DIAGNOSIS — K5900 Constipation, unspecified: Secondary | ICD-10-CM | POA: Diagnosis not present

## 2020-05-04 DIAGNOSIS — R1310 Dysphagia, unspecified: Secondary | ICD-10-CM | POA: Insufficient documentation

## 2020-05-04 DIAGNOSIS — R1319 Other dysphagia: Secondary | ICD-10-CM

## 2020-05-04 DIAGNOSIS — R109 Unspecified abdominal pain: Secondary | ICD-10-CM | POA: Insufficient documentation

## 2020-05-04 MED ORDER — NA SULFATE-K SULFATE-MG SULF 17.5-3.13-1.6 GM/177ML PO SOLN: 1.0000 | Freq: Once | ORAL | 0 refills | Status: AC

## 2020-05-04 NOTE — Telephone Encounter (Signed)
PA approved via [facility] website for EGD/DILATION Authorization #: [id number] Requested Dates: Jul 14, 2020 - Oct 12, 2020

## 2020-05-04 NOTE — Progress Notes (Signed)
Primary Care Physician:  Zhou-Talbert, Elwyn Lade, MD Primary Gastroenterologist:  Dr. Gala Romney   Chief Complaint  Patient presents with  . Dysphagia    Mostly when drinks. Feels like gets stuck in chest and causes pain.  Cyd Silence    Feels full after eating few bites  . Abdominal Pain    Occ when needs to have bm. Right and left lower abd  . Constipation  . Rectal Bleeding    Bright red occ. Has been told she has hemorrhoids. Due for tcs 2026. Last tcs at [place].    HPI:   Kara Jimenez is a 60 y.o. female who presents on referral from primary care for dysphagia and bloating.  Reviewed information provided with referral including primary care office visit dated 03/31/2018.  At that time noted early satiety and dysphagia.  She does have a diagnosis in her primary care chart of "prediabetes".  Patient previously was imaged with CT of the abdomen pelvis and and subsequently MRI by surgery status post laparoscopic appendectomy.  Findings included consistent with IPMN.  She has since been referred to [facility] cancer center for surveillance and follow-up as needed.  Review of [facility] consult with oncology 12/18/2019 noted small cystic lesion in the pancreas without concerning radiographic features and recommended repeat imaging in 6 months.  No history of colonoscopy or endoscopy in our system.  Today she states she is doing okay overall. Noted dysphagia with solid foods, liquids. Seems to be slow transition. Associated odynophagia. A couple occasions of regurgitation; following good chewing/swallowing precautions. She has occasional GERD symptoms, uses TUMS. Has a history of previous ulcer about 20 years ago. Also with abdominal pain lower abdomen. History of constipation. Pain improves with a bowel movement. Has a bowel movement variable intervals, as infrequently as once a week; this week has had daily bowel movement. Denies straining. If she doesn't go for several days she'll use a stool  softener and if no result, will use a laxative. Stools occasionally hard. Has tissue hematochezia and blood in the commode intermittently; None in a month. Typically occurs when she goes a while without a bowel movement. Occasional nausea. Denies URI or flu-like symptoms. Denies loss of sense of taste or smell. The patient has received COVID-19 vaccination(s). They have had a bosster dose as well. Denies chest pain, dyspnea, dizziness, lightheadedness, syncope, near syncope. Denies any other upper or lower GI symptoms.  Past Medical History:  Diagnosis Date  . Arthritis   . Breast injury   . Carpal tunnel syndrome   . Fibromyalgia   . Sciatica   . Sleep apnea     Past Surgical History:  Procedure Laterality Date  . BREAST EXCISIONAL BIOPSY Right    benign  . CARPAL TUNNEL RELEASE Bilateral   . CESAREAN SECTION    . LAPAROSCOPIC APPENDECTOMY N/A 11/05/2019   Procedure: APPENDECTOMY LAPAROSCOPIC;  Surgeon: Virl Cagey, MD;  Location: [facility];  Service: General;  Laterality: N/A;  . NECK SURGERY    . PARTIAL HYSTERECTOMY    . ROTATOR CUFF REPAIR Left   . TUBAL LIGATION      Current Outpatient Medications  Medication Sig Dispense Refill  . acetaminophen (TYLENOL) 500 MG tablet Take 500 mg by mouth every 6 (six) hours as needed.    . bisacodyl (DULCOLAX) 5 MG EC tablet Take 5 mg by mouth as needed for moderate constipation.    . Brinzolamide-Brimonidine (SIMBRINZA) 1-0.2 % SUSP Apply 1 drop to eye daily.     Marland Kitchen  docusate sodium (COLACE) 100 MG capsule Take 100 mg by mouth as needed for mild constipation.    Marland Kitchen lisinopril (ZESTRIL) 5 MG tablet Take 5 mg by mouth daily.    Marland Kitchen loratadine (CLARITIN) 10 MG tablet Take 10 mg by mouth daily.     No current facility-administered medications for this visit.    Allergies as of 05/04/2020 - Review Complete 05/04/2020  Allergen Reaction Noted  . Amitriptyline Shortness Of Breath 10/11/2014  . Duloxetine Shortness Of Breath 01/22/2017  .  Gabapentin Shortness Of Breath 10/11/2014  . Pregabalin Shortness Of Breath 10/11/2014  . Sertraline Shortness Of Breath 09/26/2016  . Hydrochlorothiazide Nausea And Vomiting 11/05/2019  . Baclofen  04/21/2019  . Ibuprofen  04/21/2019  . Prednisone  11/05/2019  . Amlodipine Rash 11/05/2019    Family History  Problem Relation Age of Onset  . HIV/AIDS Mother   . Hypertension Father     Social History   Socioeconomic History  . Marital status: Single    Spouse name: Not on file  . Number of children: Not on file  . Years of education: Not on file  . Highest education level: Not on file  Occupational History  . Not on file  Tobacco Use  . Smoking status: Former Research scientist (life sciences)  . Smokeless tobacco: Never Used  Vaping Use  . Vaping Use: Never used  Substance and Sexual Activity  . Alcohol use: Not Currently  . Drug use: Yes    Types: Marijuana  . Sexual activity: Not on file  Other Topics Concern  . Not on file  Social History Narrative  . Not on file   Social Determinants of Health   Financial Resource Strain: Not on file  Food Insecurity: Not on file  Transportation Needs: Not on file  Physical Activity: Not on file  Stress: Not on file  Social Connections: Not on file  Intimate Partner Violence: Not on file    Subjective: Review of Systems  Constitutional: Negative for chills, fever, malaise/fatigue and weight loss.  HENT: Negative for congestion and sore throat.   Respiratory: Negative for cough and shortness of breath.   Cardiovascular: Negative for chest pain and palpitations.  Gastrointestinal: Positive for abdominal pain, blood in stool, constipation and nausea. Negative for diarrhea, heartburn, melena and vomiting.  Musculoskeletal: Negative for joint pain and myalgias.  Skin: Negative for rash.  Neurological: Negative for dizziness and weakness.  Endo/Heme/Allergies: Does not bruise/bleed easily.  Psychiatric/Behavioral: Negative for depression. The patient  is not nervous/anxious.   All other systems reviewed and are negative.      Objective: BP 130/73   Pulse 63   Temp (!) 96.9 F (36.1 C) (Temporal)   Ht _0  (1.676 m)   Wt 229 lb 12.8 oz (104.2 kg)   LMP 04/15/2019   BMI 37.09 kg/m  Physical Exam Vitals and nursing note reviewed.  Constitutional:      General: She is not in acute distress.    Appearance: Normal appearance. She is well-developed. She is obese. She is not ill-appearing, toxic-appearing or diaphoretic.  HENT:     Head: Normocephalic and atraumatic.     Nose: No congestion or rhinorrhea.  Eyes:     General: No scleral icterus. Cardiovascular:     Rate and Rhythm: Normal rate and regular rhythm.     Heart sounds: Normal heart sounds.  Pulmonary:     Effort: Pulmonary effort is normal. No respiratory distress.     Breath sounds: Normal breath sounds.  Abdominal:     General: Bowel sounds are normal.     Palpations: Abdomen is soft. There is no hepatomegaly, splenomegaly or mass.     Tenderness: There is no abdominal tenderness. There is no guarding or rebound.     Hernia: No hernia is present.  Skin:    General: Skin is warm and dry.     Coloration: Skin is not jaundiced.     Findings: No rash.  Neurological:     General: No focal deficit present.     Mental Status: She is alert and oriented to person, place, and time.  Psychiatric:        Attention and Perception: Attention normal.        Mood and Affect: Mood normal.        Speech: Speech normal.        Behavior: Behavior normal.        Thought Content: Thought content normal.        Cognition and Memory: Cognition and memory normal.      Assessment:  Very pleasant 60 year old female presents for multiple complaints, which are likely interrelated.  These include abdominal pain with constipation and rectal bleeding (known hemorrhoids).  Colonoscopy up-to-date 2016 and next due in 2026.  She is also having dysphagia symptoms.  No other red  flag/warning signs or symptoms.  Dysphagia: Occasional GERD symptoms, though not very common.  Uses Tums over-the-counter.  At this point we will proceed with an EGD to further evaluate, consider possible dilation.  Depending on what her tissues look like she may need to be started on PPI despite generally rare symptoms of GERD.  Abdominal pain and constipation: She does have constipation wearing she denies straining, although she will have occasional hard stools.  Sometimes goes as far as a week without a bowel movement.  For the past week she has had a bowel movement daily.  Associated rectal bleeding as per below.  When she does have a bowel movement it is productive her pain improves/resolves temporarily.  She is currently using Colace as needed, laxative as needed.  It appears she only takes these after no bowel movement for nearly a week.  At this point I will have her start Colace once daily, continue her great efforts at adequate water and fiber intake.  She can use a laxative as needed for now.  Further recommendations to follow clinical progress  Rectal bleeding in the setting of known hemorrhoids: She states she was told she has internal hemorrhoids on her last colonoscopy.  She does not have bleeding and a bit, although she notes that it does typically happen when she goes multiple days without a bowel movement.  She denies overt straining.  At this point likely hemorrhoids or other benign anorectal source related to constipation as a source of her bleeding.  However, it has been about 6 years since her last colonoscopy and she is agreeable to repeat early interval due to rectal bleeding.  Further recommendations to follow.  We can also evaluate her candidacy for hemorrhoid banding during her colonoscopy.   Proceed with colonoscopy and EGD +/- dilation on propofol/MAC by Dr. Gala Romney in near future: the risks, benefits, and alternatives have been discussed with the patient in detail. The patient  states understanding and desires to proceed.  The patient is not on any other anticoagulants, anxiolytics, chronic pain medications, antidepressants, antidiabetics, or iron supplements.  However, she describes a previous poor sedation experience and seems a bit anxious  about this.  Because of this we will plan for procedure on propofol/anesthesia for adequate sedation.  ASA 3 (sleep apnea)  Plan: 1. Colonoscopy and endoscopy as described above 2. Colace daily 3. Laxative as needed 4. If persistent bleeding consider topical therapy 5. May be a candidate for hemorrhoid banding in the future 6. Follow-up in 4 months with an    Thank you for allowing [place] to participate in the care of [place], DNP, AGNP-C Adult & Gerontological Nurse Practitioner [facility]   05/04/2020 9:39 AM   Disclaimer: This note was dictated with voice recognition software. Similar sounding words can inadvertently be transcribed and may not be corrected upon review.

## 2020-05-04 NOTE — Patient Instructions (Signed)
Your health issues we discussed today were:   Abdominal pain with constipation and rectal bleeding with known hemorrhoids: 1. As we discussed, start taking Colace stool softener once a day, every day 2. You can use a laxative over-the-counter as needed for any "breakthrough constipation" or no bowel movement in 2 or more days. 3. Call us if you are still having multiple days without a bowel movement. 4. We will schedule your colonoscopy as we discussed 5. Further recommendations will follow  Dysphagia (following difficulties): 1. Continue to chew your food thoroughly and take your time eating 2. We will schedule your upper endoscopy to further evaluate and treat your swallowing difficulties 3. If food gets stuck and will not go forward or will not go backwards for 2 more hours and proceed to the emergency room 4. Further recommendations will follow your upper endoscopy  Overall I recommend:  1. Continue other current medications 2. Return for follow-up in 4 months 3. Call us for any questions or concerns   ---------------------------------------------------------------  I am glad you have gotten your COVID-19 vaccination!  Even though you are fully vaccinated you should continue to follow CDC and state/local guidelines.  ---------------------------------------------------------------   At [facility] we value your feedback. You may receive a survey about your visit today. Please share your experience as we strive to create trusting relationships with our patients to provide genuine, compassionate, quality care.  We appreciate your understanding and patience as we review any laboratory studies, imaging, and other diagnostic tests that are ordered as we care for you. Our office policy is 5 business days for review of these results, and any emergent or urgent results are addressed in a timely manner for your best interest. If you do not hear from our office in 1 week,  please contact us.   We also encourage the use of MyChart, which contains your medical information for your review as well. If you are not enrolled in this feature, an access code is on this after visit summary for your convenience. Thank you for allowing [place] to be involved in your care.  It was great to see you today!  I hope you have a great spring!!

## 2020-05-05 ENCOUNTER — Encounter (HOSPITAL_COMMUNITY): Payer: Self-pay

## 2020-05-05 ENCOUNTER — Other Ambulatory Visit (HOSPITAL_COMMUNITY): Payer: Self-pay | Admitting: Family Medicine

## 2020-05-05 ENCOUNTER — Ambulatory Visit (HOSPITAL_COMMUNITY)
Admission: RE | Admit: 2020-05-05 | Discharge: 2020-05-05 | Disposition: A | Discharge status: Home / Self Care | Payer: Medicare Other | Source: Ambulatory Visit | Attending: Family Medicine | Admitting: Family Medicine

## 2020-05-05 DIAGNOSIS — Z1231 Encounter for screening mammogram for malignant neoplasm of breast: Secondary | ICD-10-CM

## 2020-05-05 DIAGNOSIS — N63 Unspecified lump in unspecified breast: Secondary | ICD-10-CM

## 2020-05-06 ENCOUNTER — Ambulatory Visit (INDEPENDENT_AMBULATORY_CARE_PROVIDER_SITE_OTHER): Payer: Medicare Other

## 2020-05-06 ENCOUNTER — Other Ambulatory Visit: Payer: Self-pay

## 2020-05-06 DIAGNOSIS — R011 Cardiac murmur, unspecified: Secondary | ICD-10-CM

## 2020-05-06 LAB — ECHOCARDIOGRAM COMPLETE
Area-P 1/2: 2.43 cm2
Calc EF: 59.1 %
S' Lateral: 2.3 cm
Single Plane A2C EF: 53.2 %
Single Plane A4C EF: 67.2 %

## 2020-05-17 ENCOUNTER — Ambulatory Visit (HOSPITAL_COMMUNITY)
Admission: RE | Admit: 2020-05-17 | Discharge: 2020-05-17 | Disposition: A | Discharge status: Home / Self Care | Payer: Medicare Other | Source: Ambulatory Visit | Attending: Family Medicine | Admitting: Family Medicine

## 2020-05-17 ENCOUNTER — Other Ambulatory Visit: Payer: Self-pay

## 2020-05-17 ENCOUNTER — Encounter (HOSPITAL_COMMUNITY): Payer: Self-pay

## 2020-05-17 DIAGNOSIS — N6322 Unspecified lump in the left breast, upper inner quadrant: Secondary | ICD-10-CM | POA: Diagnosis not present

## 2020-05-17 DIAGNOSIS — N63 Unspecified lump in unspecified breast: Secondary | ICD-10-CM

## 2020-05-17 DIAGNOSIS — N6320 Unspecified lump in the left breast, unspecified quadrant: Secondary | ICD-10-CM | POA: Diagnosis present

## 2020-05-17 IMAGING — US US BREAST*L* LIMITED INC AXILLA
1 series · 2 of 2 positions shown · non-contrast
Comparison: Previous exams.

CLINICAL DATA: 60-year-old female with a palpable area of concern
in the far upper inner left breast/chest wall.

EXAM:
DIGITAL DIAGNOSTIC BILATERAL MAMMOGRAM WITH TOMOSYNTHESIS AND CAD;
ULTRASOUND LEFT BREAST LIMITED
TECHNIQUE: Bilateral digital diagnostic mammography and breast tomosynthesis
was performed. The images were evaluated with computer-aided
detection.; Targeted ultrasound examination of the left breast was
performed

[Series 1: us breast*left* limited inc axilla · 0.07mm/px · 2 of 2 slices shown]
[im 1/2]
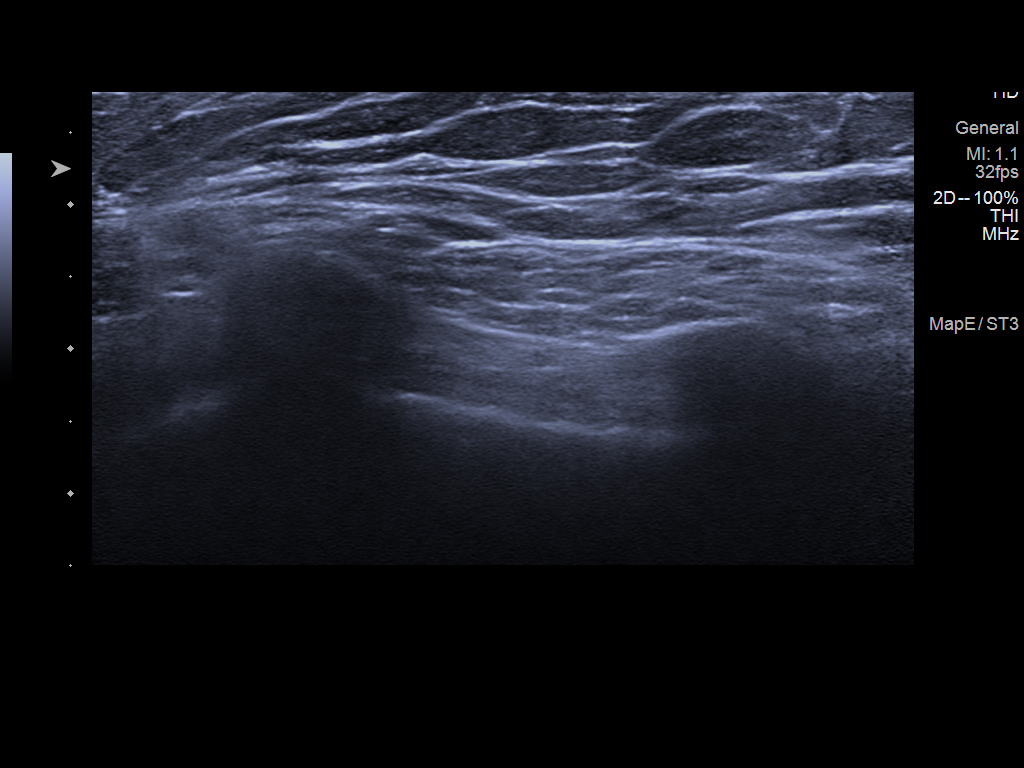
[im 2/2]
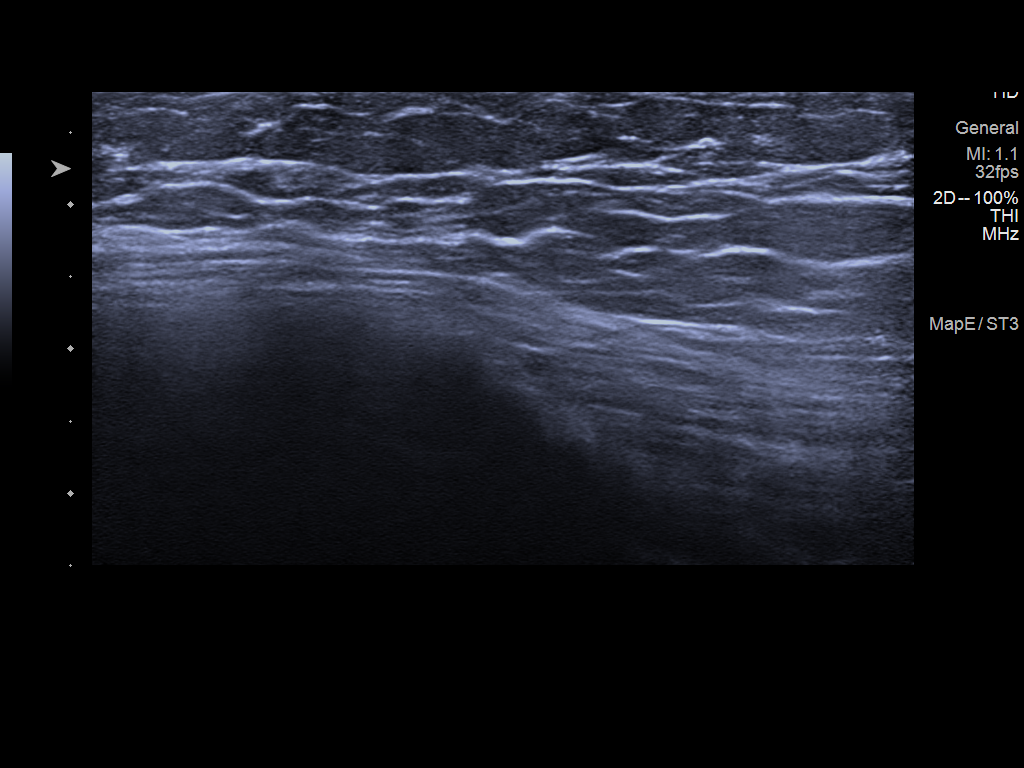

[2 of 2 positions shown; findings below may reference images not displayed]

ACR Breast Density Category b: There are scattered areas of
fibroglandular density.
FINDINGS: No suspicious masses or calcifications are seen in either breast.
Spot compression tomograms were performed over the palpable area of
concern in the left breast with no definite abnormality seen
although this area was difficult to include entirely in the field of
view due to the far superior medial location.

Physical examination at site of palpable concern in the far upper
inner left breast does not reveal any palpable masses. There is soft
thickening over a rib in this location felt to be related to a
normal fat lobule.

Targeted ultrasound of the left upper inner chest at site of
palpable concern was performed. No suspicious masses or abnormality
seen, only normal-appearing fatty tissue identified.
IMPRESSION: 1. No mammographic or sonographic abnormalities at the site of
palpable concern in the far upper inner left breast/chest wall. The
patient may be feeling a normal fat lobule/fatty tissue rolling over
a rib in this location.

2.  No mammographic evidence of malignancy in either breast.

RECOMMENDATION:
1. Recommend further management of the palpable area of concern in
the left breast/chest be based on clinical assessment.

2.  Screening mammogram in one year.(Code:[V8])

I have discussed the findings and recommendations with the patient.
If applicable, a reminder letter will be sent to the patient
regarding the next appointment.

BI-RADS CATEGORY  1: Negative.

## 2020-05-17 IMAGING — MG DIGITAL DIAGNOSTIC BILAT W/ TOMO W/ CAD
6 of 10 series · 6 of 30 positions shown · non-contrast
Comparison: Previous exams.

CLINICAL DATA: 60-year-old female with a palpable area of concern
in the far upper inner left breast/chest wall.

EXAM:
DIGITAL DIAGNOSTIC BILATERAL MAMMOGRAM WITH TOMOSYNTHESIS AND CAD;
ULTRASOUND LEFT BREAST LIMITED
TECHNIQUE: Bilateral digital diagnostic mammography and breast tomosynthesis
was performed. The images were evaluated with computer-aided
detection.; Targeted ultrasound examination of the left breast was
performed

[L MLO synth-2D]
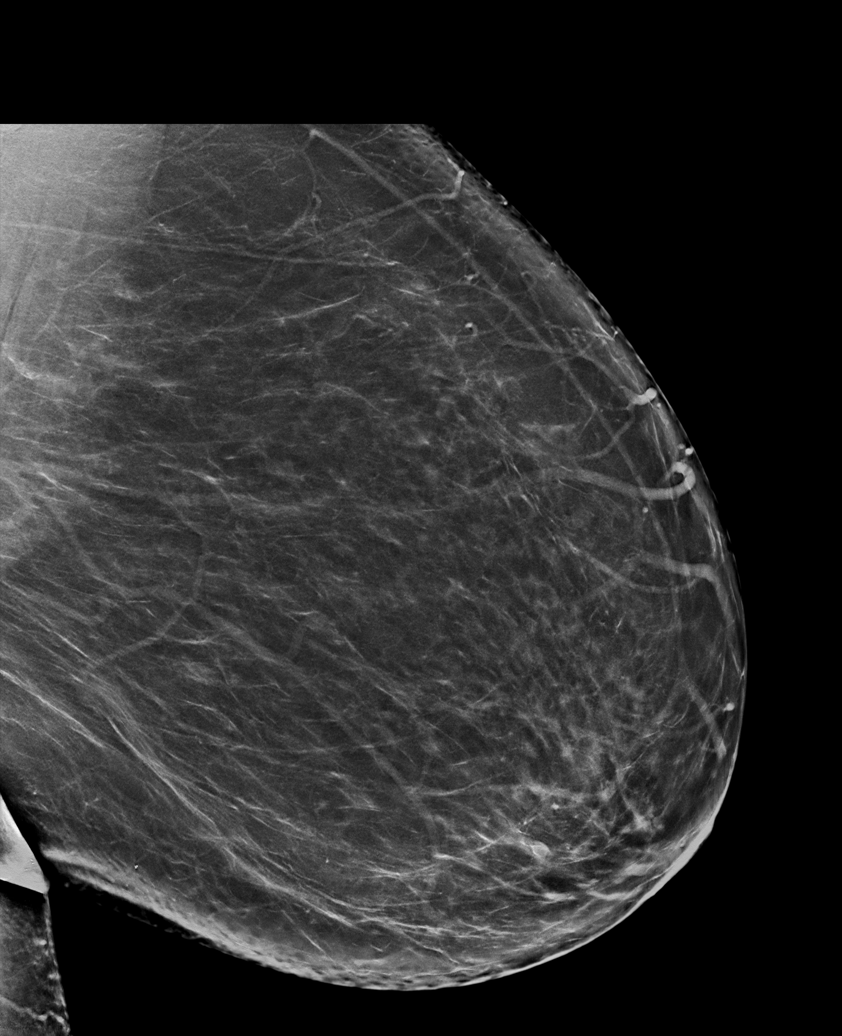

[R CC synth-2D]
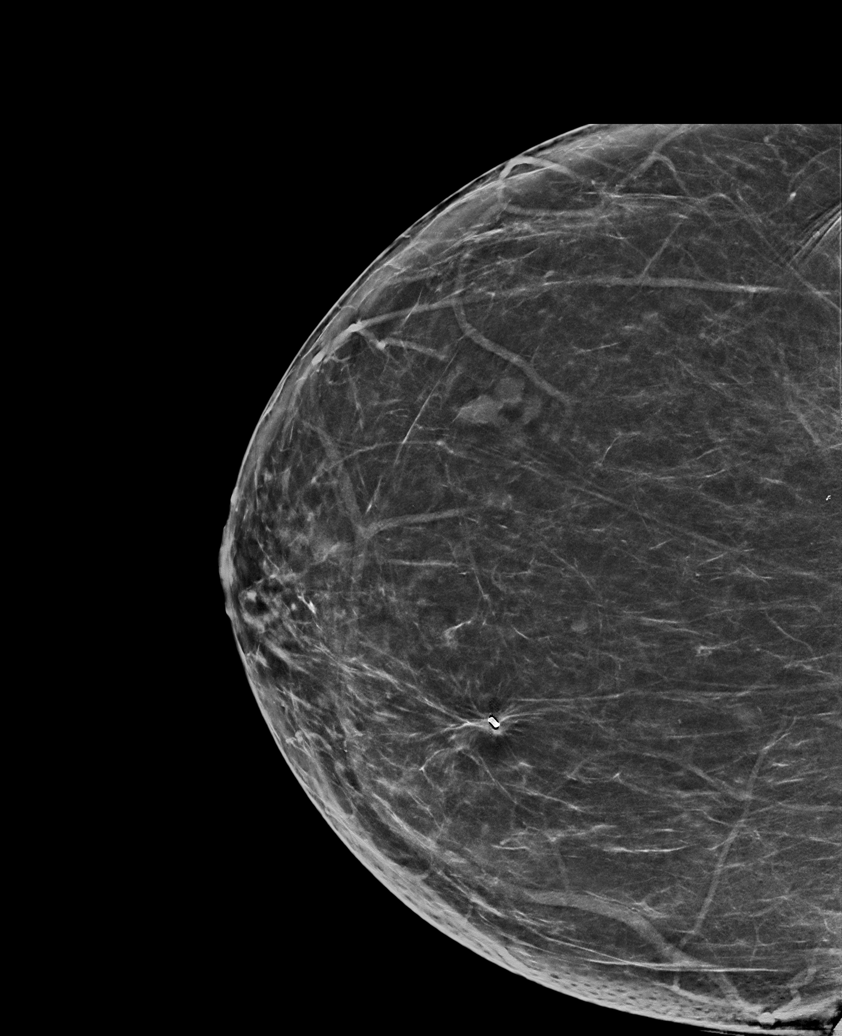

[R MLO synth-2D]
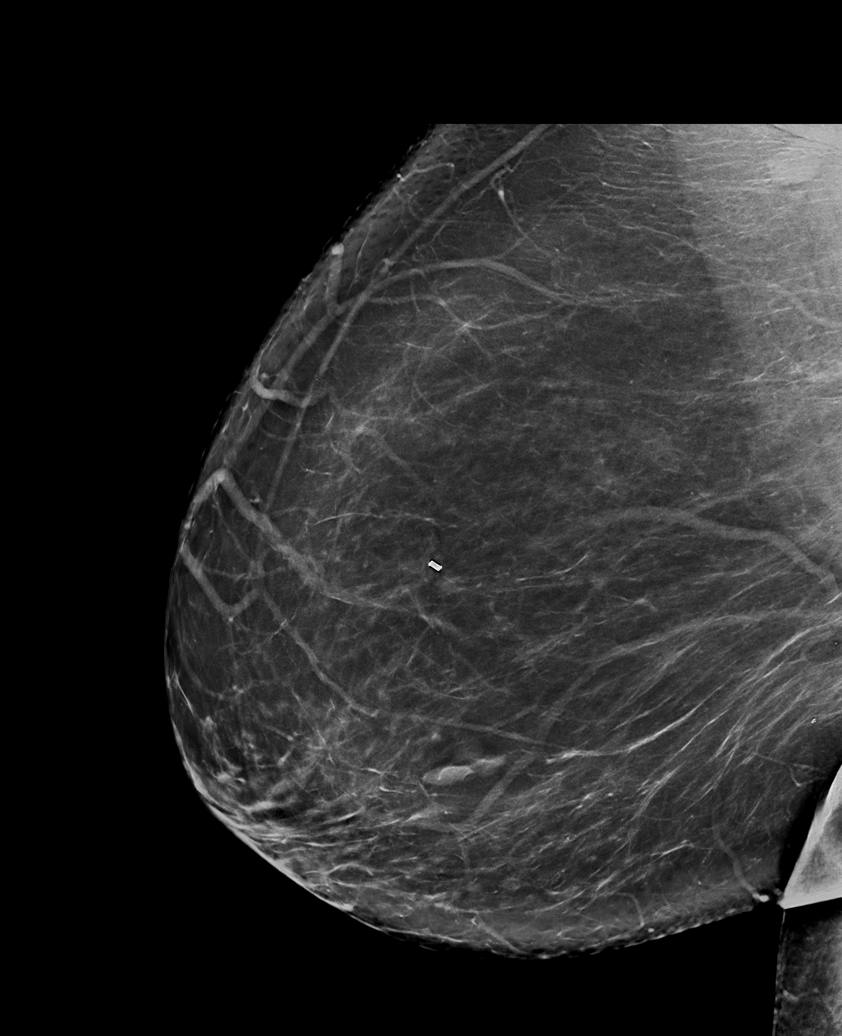

[L CC synth-2D]
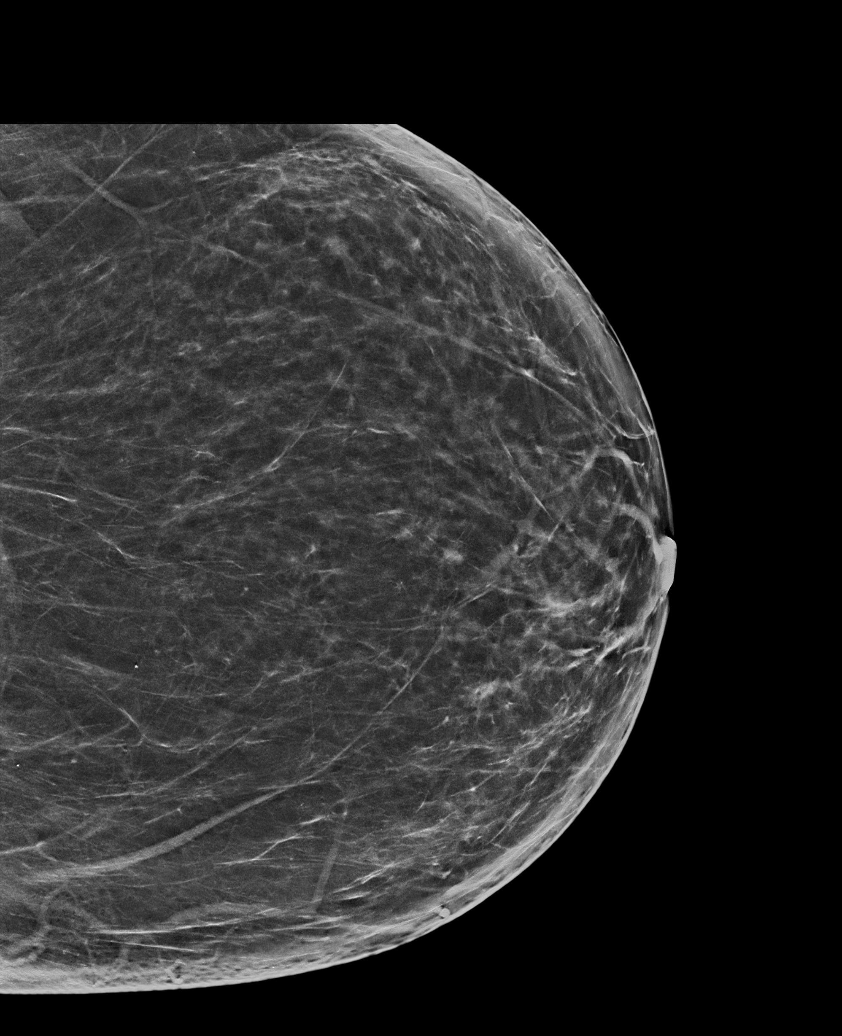

[L ML synth-2D]
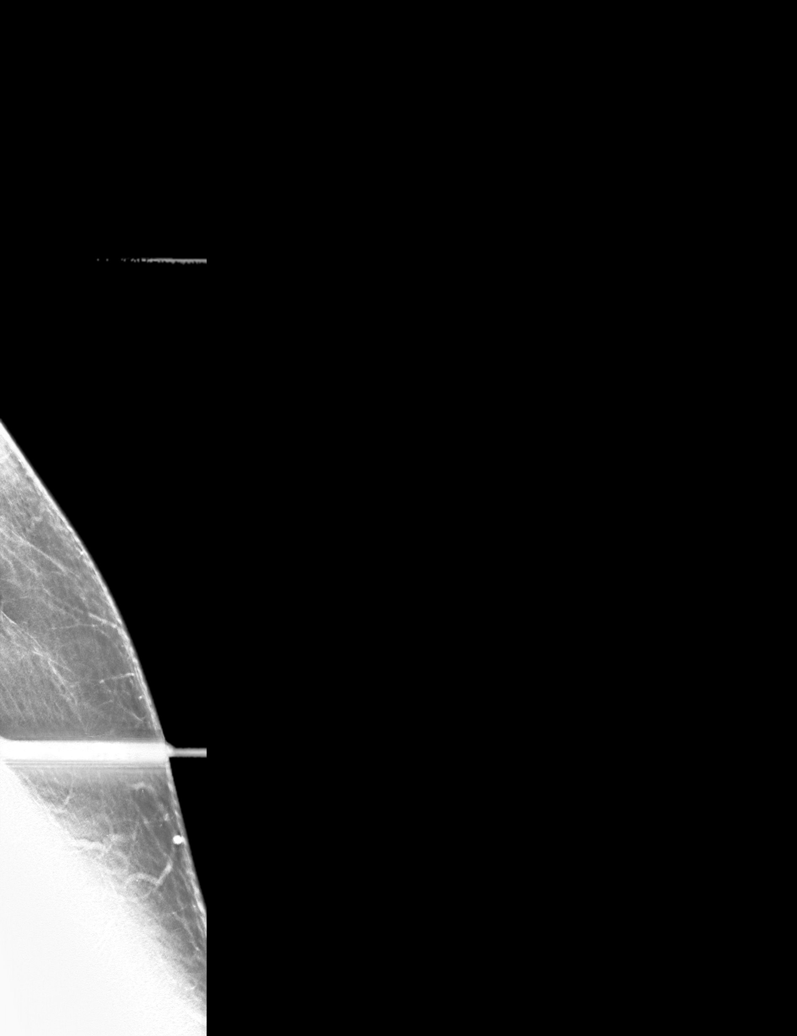

[L ML tomo · tomo slice 27/54.0]
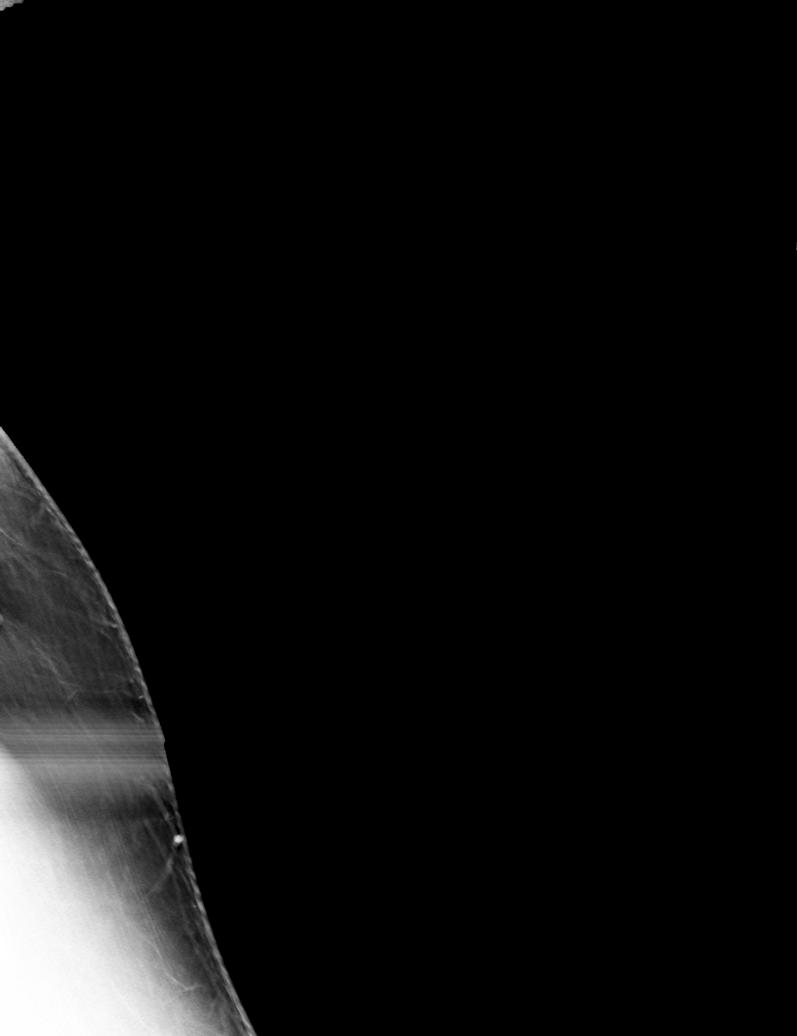

[6 of 30 positions shown; findings below may reference images not displayed]

ACR Breast Density Category b: There are scattered areas of
fibroglandular density.
FINDINGS: No suspicious masses or calcifications are seen in either breast.
Spot compression tomograms were performed over the palpable area of
concern in the left breast with no definite abnormality seen
although this area was difficult to include entirely in the field of
view due to the far superior medial location.

Physical examination at site of palpable concern in the far upper
inner left breast does not reveal any palpable masses. There is soft
thickening over a rib in this location felt to be related to a
normal fat lobule.

Targeted ultrasound of the left upper inner chest at site of
palpable concern was performed. No suspicious masses or abnormality
seen, only normal-appearing fatty tissue identified.
IMPRESSION: 1. No mammographic or sonographic abnormalities at the site of
palpable concern in the far upper inner left breast/chest wall. The
patient may be feeling a normal fat lobule/fatty tissue rolling over
a rib in this location.

2.  No mammographic evidence of malignancy in either breast.

RECOMMENDATION:
1. Recommend further management of the palpable area of concern in
the left breast/chest be based on clinical assessment.

2.  Screening mammogram in one year.(Code:[V8])

I have discussed the findings and recommendations with the patient.
If applicable, a reminder letter will be sent to the patient
regarding the next appointment.

BI-RADS CATEGORY  1: Negative.

## 2020-05-19 ENCOUNTER — Ambulatory Visit: Payer: Medicare Other | Admitting: Cardiology

## 2020-06-08 ENCOUNTER — Other Ambulatory Visit: Payer: Self-pay | Admitting: Physical Medicine & Rehabilitation

## 2020-06-08 ENCOUNTER — Other Ambulatory Visit (HOSPITAL_COMMUNITY): Payer: Self-pay | Admitting: Physical Medicine & Rehabilitation

## 2020-06-08 DIAGNOSIS — M542 Cervicalgia: Secondary | ICD-10-CM

## 2020-06-14 ENCOUNTER — Other Ambulatory Visit (HOSPITAL_COMMUNITY): Payer: Self-pay | Admitting: Family Medicine

## 2020-06-14 ENCOUNTER — Other Ambulatory Visit: Payer: Self-pay | Admitting: Family Medicine

## 2020-06-14 DIAGNOSIS — R6884 Jaw pain: Secondary | ICD-10-CM

## 2020-06-22 ENCOUNTER — Ambulatory Visit (HOSPITAL_COMMUNITY)
Admission: RE | Admit: 2020-06-22 | Discharge: 2020-06-22 | Disposition: A | Discharge status: Home / Self Care | Payer: Medicare Other | Source: Ambulatory Visit | Attending: Physical Medicine & Rehabilitation | Admitting: Physical Medicine & Rehabilitation

## 2020-06-22 DIAGNOSIS — M542 Cervicalgia: Secondary | ICD-10-CM

## 2020-06-22 IMAGING — MR MR CERVICAL SPINE W/O CM
5 series · 32 of 48 positions shown · non-contrast
Comparison: [DATE]

CLINICAL DATA: Weakness and numbness in the right arm. No known
injury.

EXAM:
MRI CERVICAL SPINE WITHOUT CONTRAST
TECHNIQUE: Multiplanar, multisequence MR imaging of the cervical spine was
performed. No intravenous contrast was administered.

[Series 5: T2 · sagittal · 3.0mm · 0.69mm/px · 5 of 15 slices shown (1 of 2)]
[im 1/15]
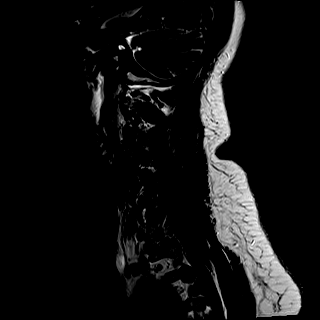
[im 4/15]
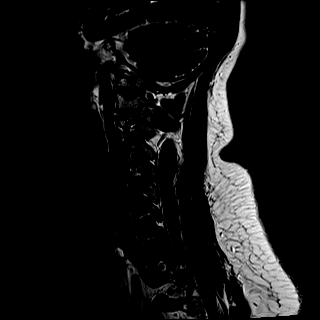
[im 8/15]
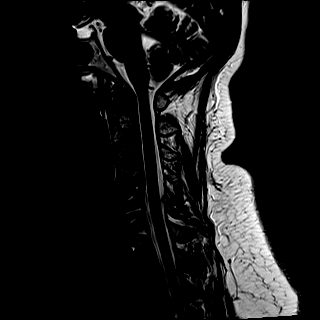
[im 11/15]
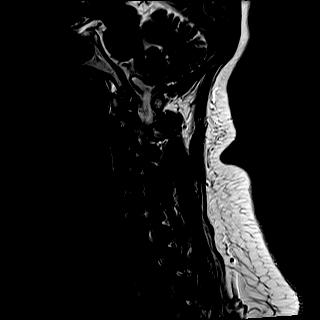
[im 15/15]
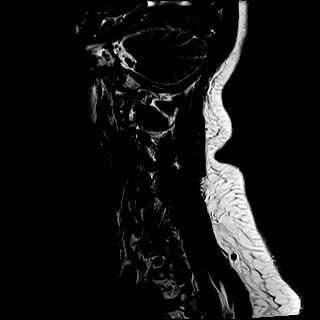

[Series 6: T1 · sagittal · 3.0mm · 0.43mm/px · 6 of 15 slices shown]
[im 1/15]
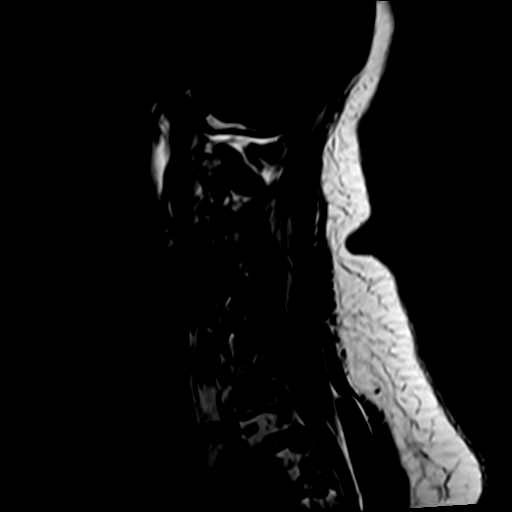
[im 3/15]
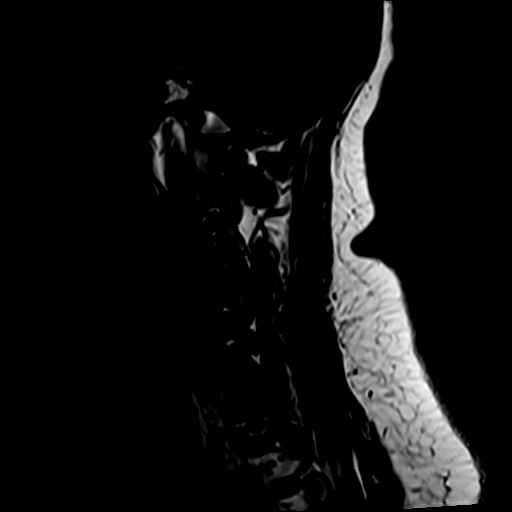
[im 6/15]
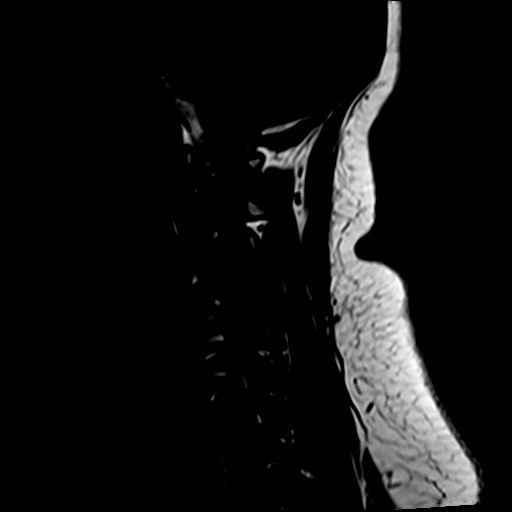
[im 9/15]
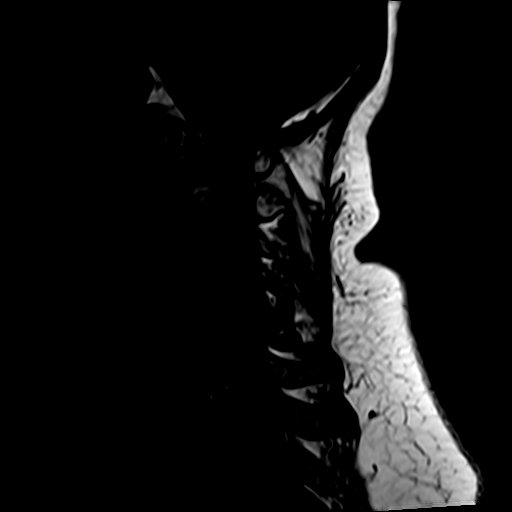
[im 12/15]
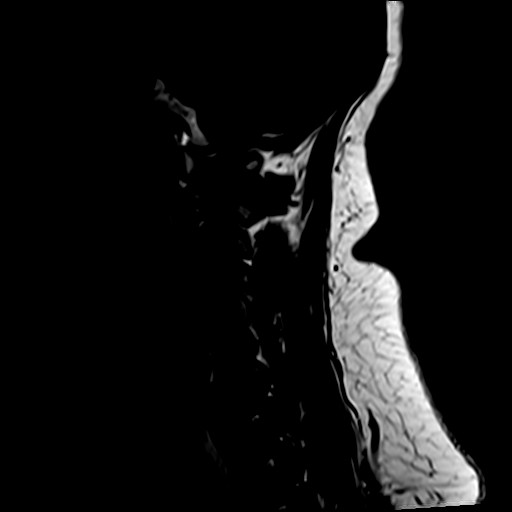
[im 15/15]
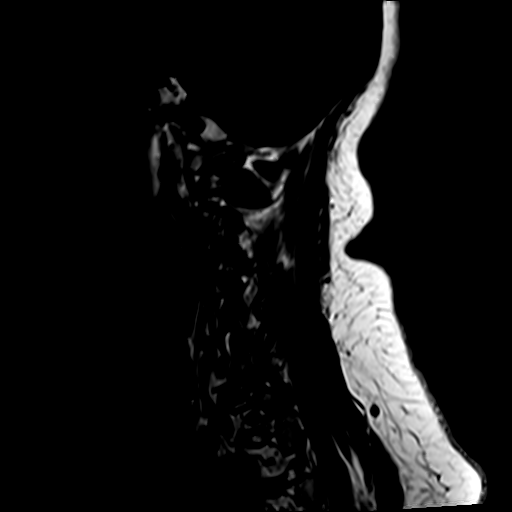

[Series 7: STIR · sagittal · 3.0mm · 0.86mm/px · 6 of 15 slices shown]
[im 1/15]
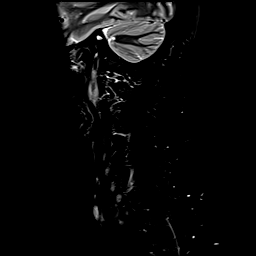
[im 3/15]
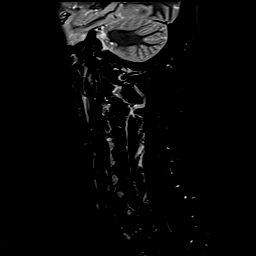
[im 6/15]
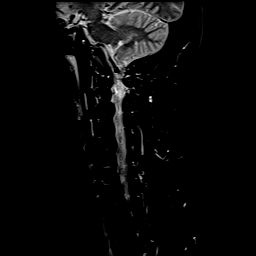
[im 9/15]
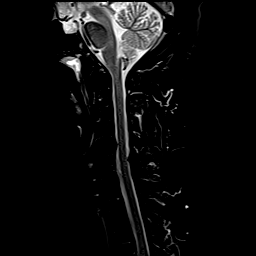
[im 12/15]
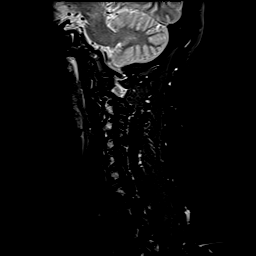
[im 15/15]
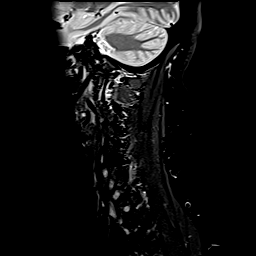

[Series 8: T2 · axial · 3.0mm · 0.70mm/px · z∈[-79,+40]mm · 9 of 38 slices shown (2 of 2)]
[im 1/38]
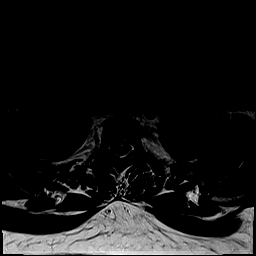
[im 6/38]
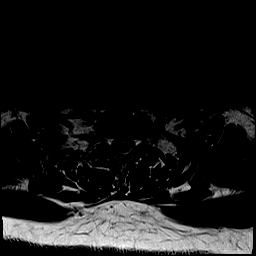
[im 11/38]
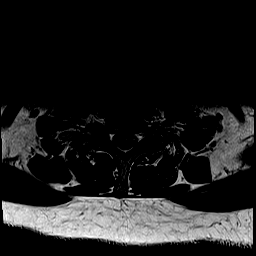
[im 16/38]
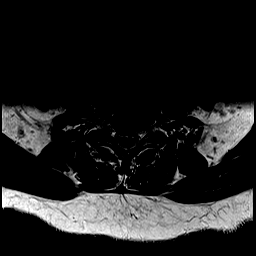
[im 19/38]
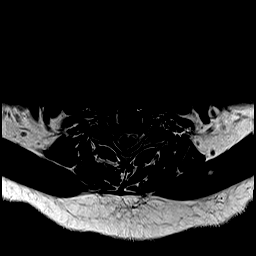
[im 22/38]
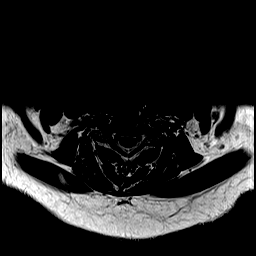
[im 27/38]
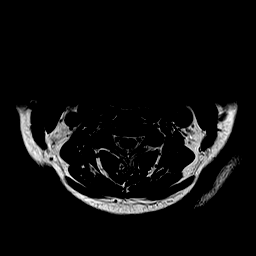
[im 32/38]
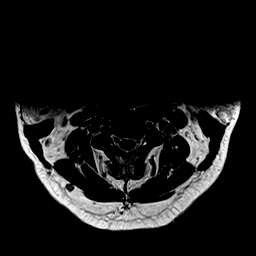
[im 38/38]
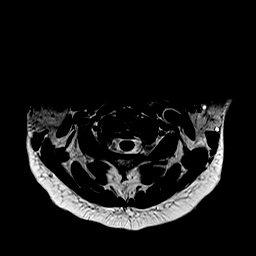

[Series 9: GRE · axial · 3.0mm · 0.35mm/px · z∈[-78,+10]mm · 6 of 40 slices shown]
[im 3/40]
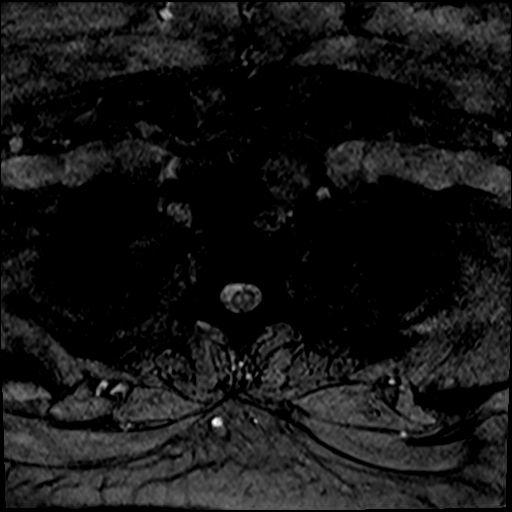
[im 8/40]
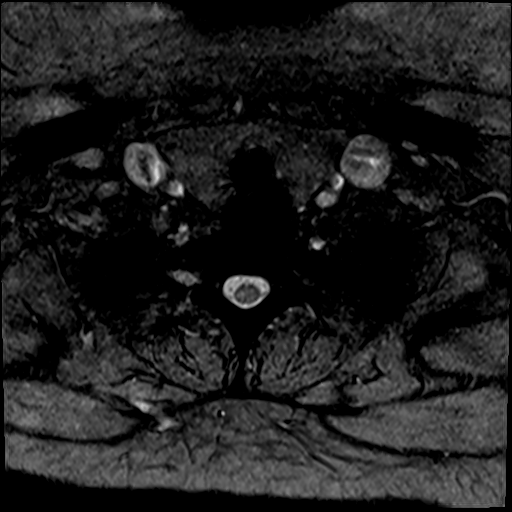
[im 14/40]
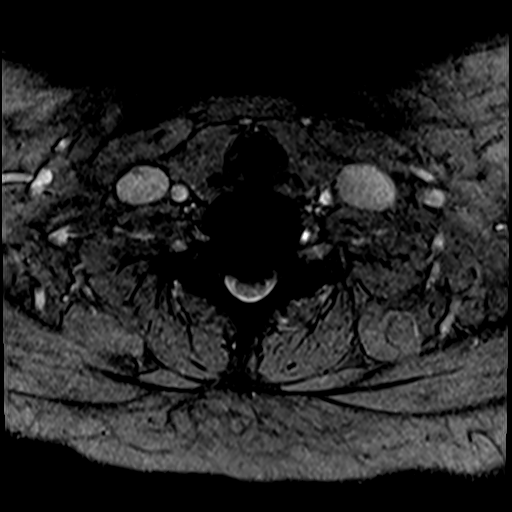
[im 19/40]
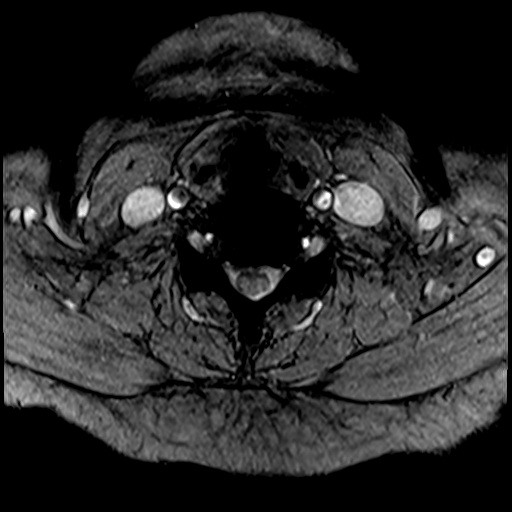
[im 24/40]
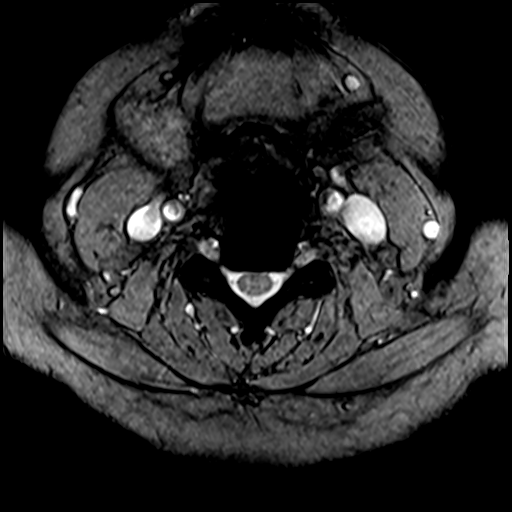
[im 29/40]
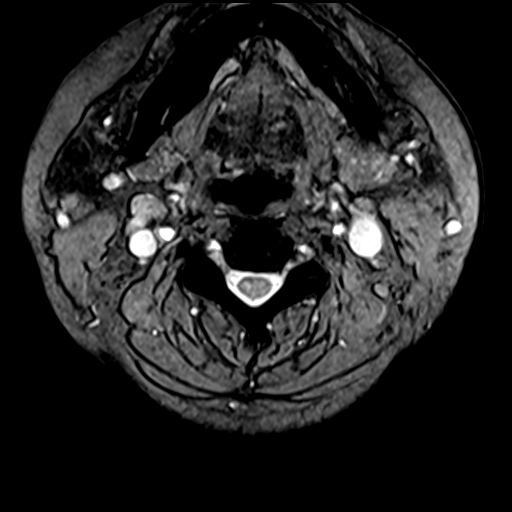

[32 of 48 positions shown; findings below may reference images not displayed]

FINDINGS: Alignment: Physiologic.

Vertebrae: No fracture, evidence of discitis, or bone lesion.

Cord: Normal signal and morphology.

Posterior Fossa, vertebral arteries, paraspinal tissues: Posterior
fossa demonstrates no focal abnormality. Vertebral artery flow voids
are maintained. Paraspinal soft tissues are unremarkable.

Disc levels:

Discs: Degenerative disease with disc height loss at C4-5. Anterior
cervical fusion from C5 through C7.

C2-3: No significant disc bulge. Moderate left foraminal stenosis.
No right foraminal stenosis. No central canal stenosis.

C3-4: No significant disc bulge. No neural foraminal stenosis. No
central canal stenosis.

C4-5: Mild broad-based disc bulge. Right uncovertebral degenerative
changes. Mild right foraminal stenosis. No central canal stenosis.

C5-6: Interbody fusion. No neural foraminal stenosis. No central
canal stenosis.

C6-7: Interbody fusion. No neural foraminal stenosis. No central
canal stenosis.

C7-T1: No significant disc bulge. No neural foraminal stenosis. No
central canal stenosis.
IMPRESSION: 1. Anterior cervical fusion from C5 through C7 without foraminal or
central canal stenosis.
2. At C4-5 there is a mild broad-based disc bulge. Right
uncovertebral degenerative changes. Mild right foraminal stenosis.

## 2020-06-27 ENCOUNTER — Ambulatory Visit (HOSPITAL_COMMUNITY)
Admission: RE | Admit: 2020-06-27 | Discharge: 2020-06-27 | Disposition: A | Discharge status: Home / Self Care | Payer: Medicare Other | Source: Ambulatory Visit | Attending: Family Medicine | Admitting: Family Medicine

## 2020-06-27 DIAGNOSIS — R6884 Jaw pain: Secondary | ICD-10-CM | POA: Diagnosis not present

## 2020-06-27 DIAGNOSIS — G462 Posterior cerebral artery syndrome: Secondary | ICD-10-CM | POA: Diagnosis not present

## 2020-06-27 IMAGING — MR MR HEAD WO/W CM
19 of 22 series · 26 of 48 positions shown · non-contrast
Comparison: No pertinent prior exam.
COMPARISON: No pertinent prior exam.

CLINICAL DATA: Jaw pain.

EXAM:
MRI HEAD WITHOUT CONTRAST
MRA HEAD WITHOUT CONTRAST
TECHNIQUE: Multiplanar, multi-echo pulse sequences of the brain and surrounding
structures were acquired without intravenous contrast. Angiographic
images of the Circle of Willis were acquired using MRA technique
without intravenous contrast.

[Series 5: DWI · axial · 4.0mm · 0.88mm/px · 1 of 36 slices shown (1 of 6)]
[im 1/36]
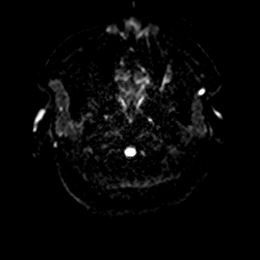

[Series 5: DWI · axial · 4.0mm · 0.88mm/px · 1 of 36 slices shown (2 of 6)]
[im 1/36]
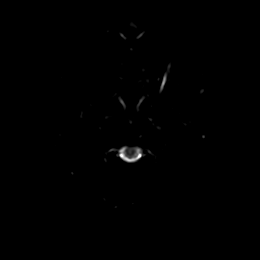

[Series 6: DWI · axial · 4.0mm · 0.88mm/px · z∈[-105,+31]mm · 2 of 36 slices shown (3 of 6)]
[im 1/36]
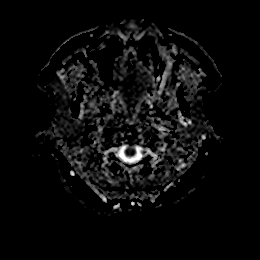
[im 36/36]
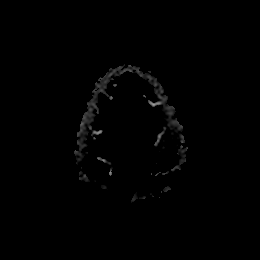

[Series 7: DWI · coronal · 5.0mm · 0.88mm/px · 2 of 28 slices shown (4 of 6)]
[im 1/28]
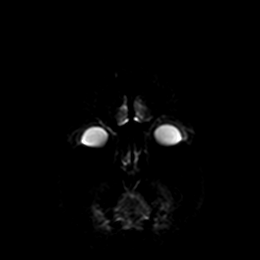
[im 28/28]
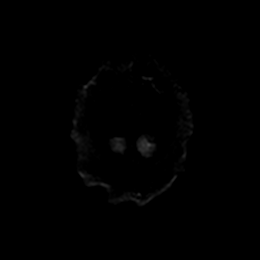

[Series 7: DWI · coronal · 5.0mm · 0.88mm/px · 2 of 28 slices shown (5 of 6)]
[im 1/28]
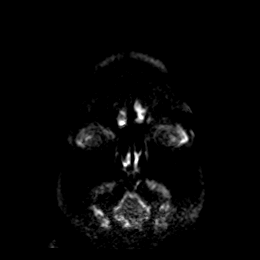
[im 28/28]
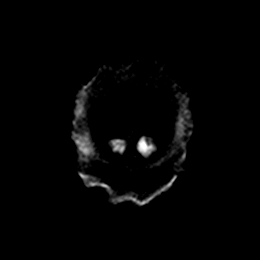

[Series 8: DWI · coronal · 5.0mm · 0.88mm/px · 2 of 28 slices shown (6 of 6)]
[im 1/28]
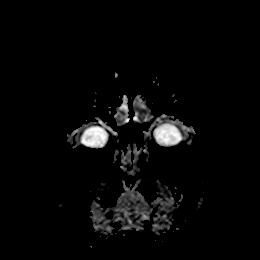
[im 28/28]
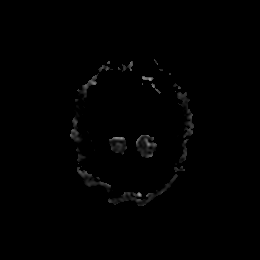

[Series 9: T1 · sagittal · 5.0mm · 0.94mm/px · 1 of 25 slices shown (1 of 2)]
[im 1/25]
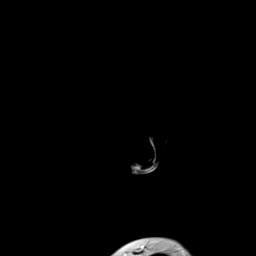

[Series 15: T2 · axial · 5.0mm · 0.72mm/px · 1 of 20 slices shown (1 of 2)]
[im 1/20]
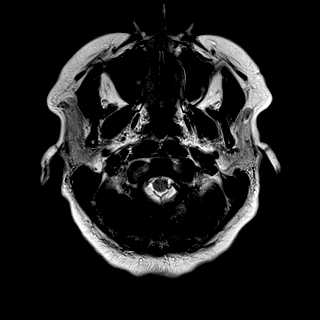

[Series 16: ax hemo · axial · 5.0mm · 0.86mm/px · 1 of 25 slices shown]
[im 1/25]
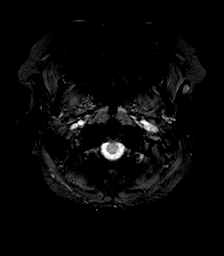

[Series 17: FLAIR · axial · 4.0mm · 0.43mm/px · z∈[-92,+28]mm · 2 of 32 slices shown]
[im 1/32]
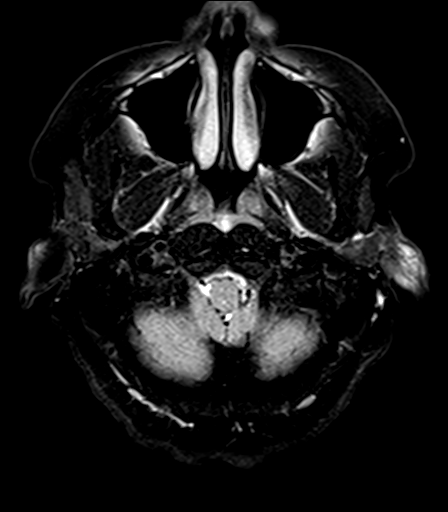
[im 32/32]
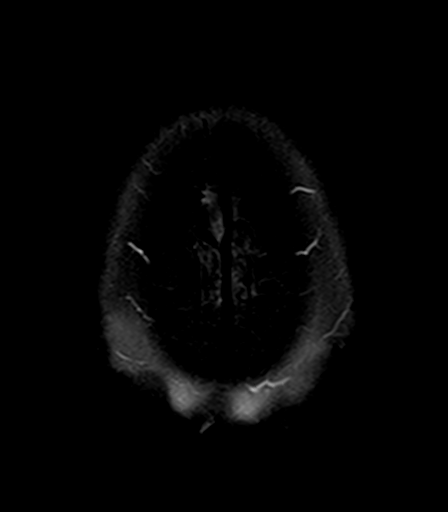

[Series 19: T2 · coronal · 5.0mm · 0.72mm/px · 2 of 28 slices shown (2 of 2)]
[im 1/28]
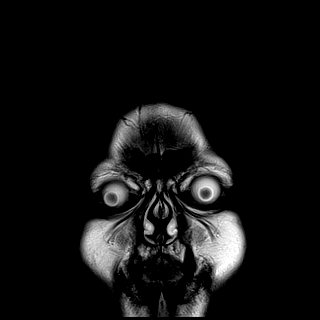
[im 28/28]
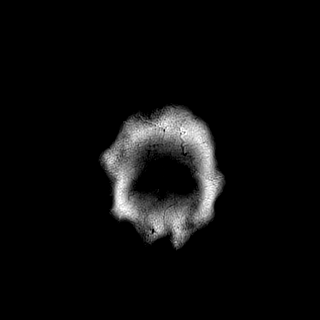

[Series 21: T1 post-contrast · coronal · 5.0mm · 0.34mm/px · 2 of 29 slices shown]
[im 1/29]
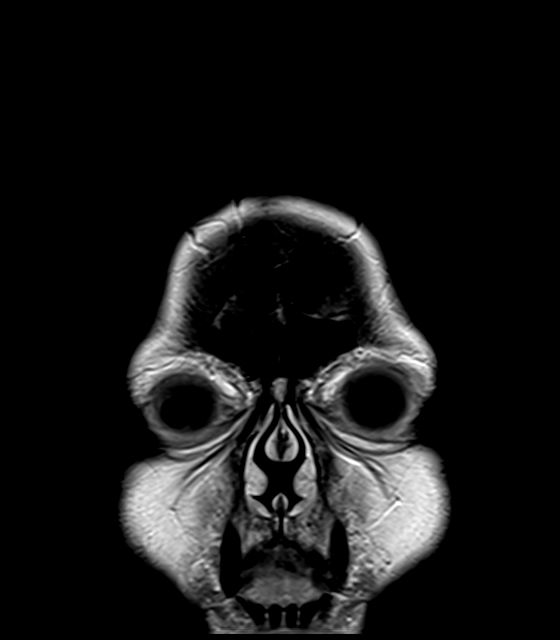
[im 29/29]
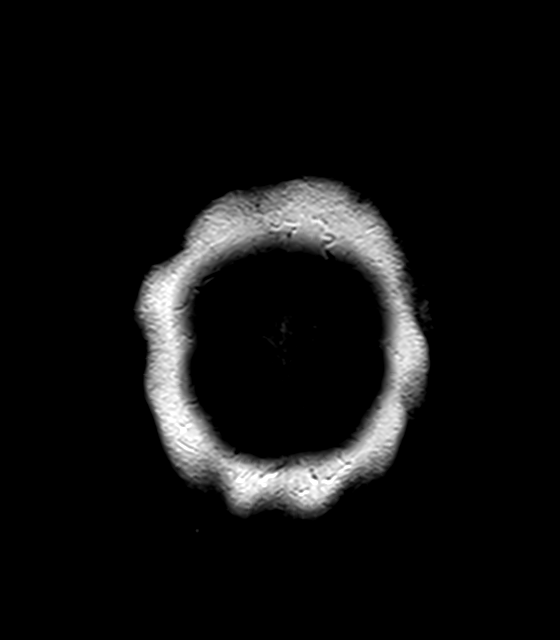

[Series 22: T1 · sagittal · 5.0mm · 0.94mm/px · 1 of 25 slices shown (2 of 2)]
[im 1/25]
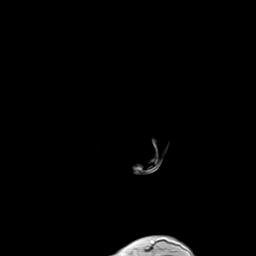

[Series 1037: tumble · 0.34mm/px · 1 of 6 slices shown (1 of 3)]
[im 1/6]
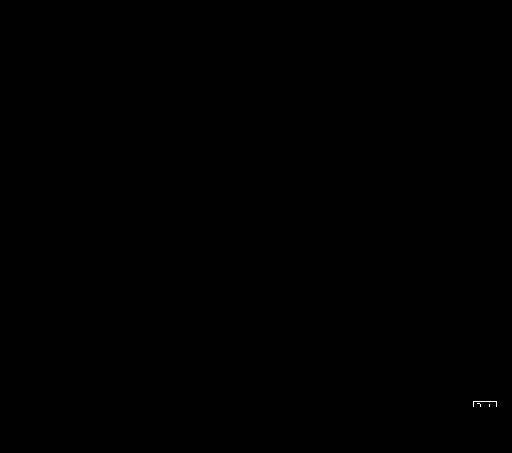

[Series 1041: rotate · 0.34mm/px · 1 of 11 slices shown (1 of 3)]
[im 1/11]
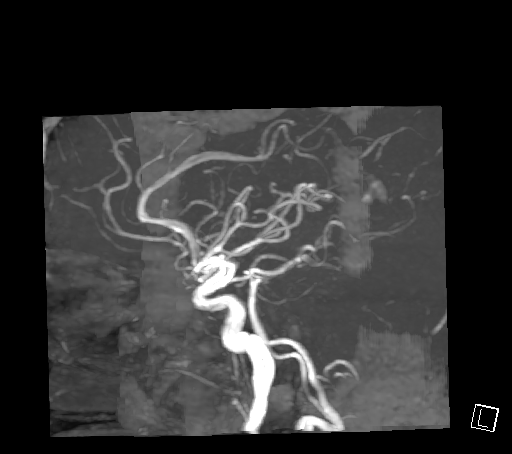

[Series 1045: tumble · 0.34mm/px · 1 of 12 slices shown (2 of 3)]
[im 1/12]
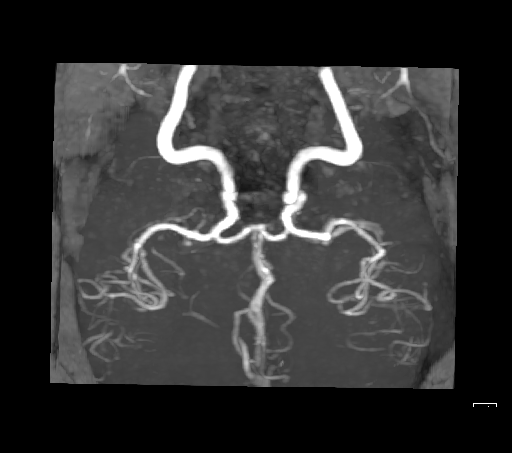

[Series 1049: rotate · 0.34mm/px · 1 of 11 slices shown (2 of 3)]
[im 1/11]
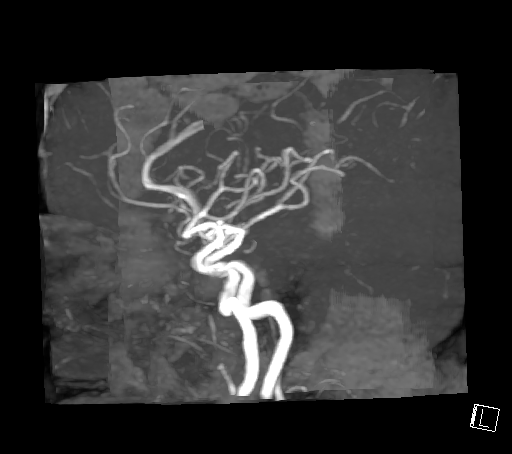

[Series 1053: tumble · 0.34mm/px · 1 of 19 slices shown (3 of 3)]
[im 1/19]
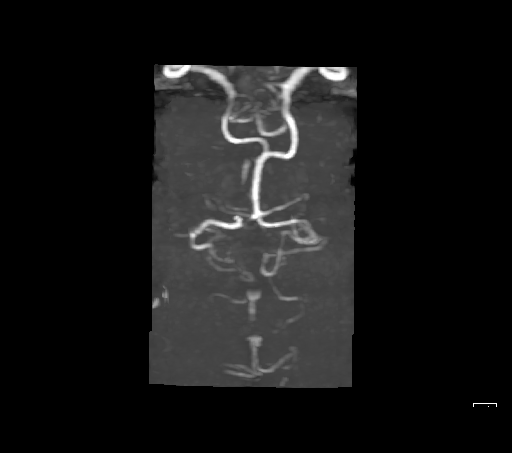

[Series 1057: rotate · 0.34mm/px · 1 of 19 slices shown (3 of 3)]
[im 1/19]
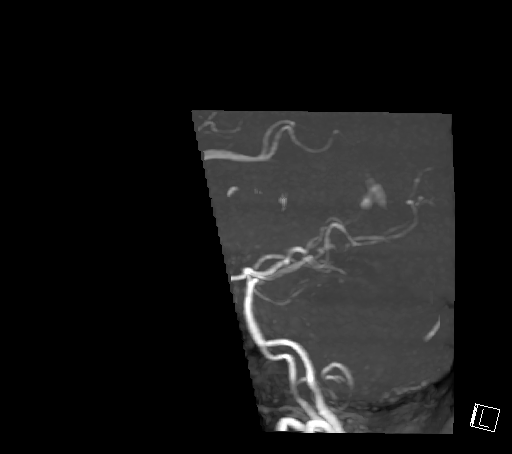

[26 of 48 positions shown; findings below may reference images not displayed]

FINDINGS: MRI HEAD FINDINGS

Brain: No acute infarction, hemorrhage, hydrocephalus, extra-axial
collection or mass lesion. The brain parenchyma has normal
morphology and signal characteristics. No focus of abnormal contrast
enhancement.

Vascular: Normal flow voids.

Skull and upper cervical spine: Normal marrow signal.

Sinuses/Orbits: No acute or significant finding.

Other: None.

MRA HEAD FINDINGS

Anterior circulation: The visualized portions of the distal cervical
and intracranial internal carotid arteries are widely patent with
normal flow related enhancement. The bilateral anterior cerebral
arteries and middle cerebral arteries are widely patent with
antegrade flow without high-grade flow-limiting stenosis or proximal
branch occlusion. No intracranial aneurysm within the anterior
circulation.

Posterior circulation: The vertebral arteries are widely patent with
antegrade flow. Vertebrobasilar junction and basilar artery are
widely patent with antegrade flow without evidence of basilar
stenosis or aneurysm. Posterior cerebral arteries are normal
bilaterally. No intracranial aneurysm within the posterior
circulation.

Anatomic variants: Hypoplastic right P1 segment with a prominent
right posterior communicating artery (fetal PCA).
IMPRESSION: 1. Unremarkable MRI of the brain.
2. Remarkable MR angiogram the brain.

## 2020-06-27 MED ORDER — GADOBUTROL 1 MMOL/ML IV SOLN
7.0000 mL | Freq: Once | INTRAVENOUS | Status: AC | PRN
  Administered 2020-06-27: 10 mL via INTRAVENOUS

## 2020-07-07 NOTE — Patient Instructions (Signed)
Kara Jimenez  07/07/2020     @PREFPERIOPPHARMACY @   Your procedure is scheduled on  07/14/2020.   Report to [facility] at  1100  A.M.   Call this number if you have problems the morning of surgery:  [phone]   Remember:  Follow the diet and prep instructions given to you by the office.    Take these medicines the morning of surgery with A SIP OF WATER      tegretol.     Please brush your teeth.  Do not wear jewelry, make-up or nail polish.  Do not wear lotions, powders, or perfumes, or deodorant.  Do not shave 48 hours prior to surgery.  Men may shave face and neck.  Do not bring valuables to the hospital.  [facility] is not responsible for any belongings or valuables.  Contacts, dentures or bridgework may not be worn into surgery.  Leave your suitcase in the car.  After surgery it may be brought to your room.  For patients admitted to the hospital, discharge time will be determined by your treatment team.  Patients discharged the day of surgery will not be allowed to drive home and must have someone with them for 24 hours.    Special instructions:   DO NOT smoke tobacco or vape for24 hours before your procedure.  Please read over the following fact sheets that you were given. Anesthesia Post-op Instructions and Care and Recovery After Surgery      Upper Endoscopy, Adult, Care After This sheet gives you information about how to care for yourself after your procedure. Your health care provider may also give you more specific instructions. If you have problems or questions, contact your health careprovider. What can I expect after the procedure? After the procedure, it is common to have: A sore throat. Mild stomach pain or discomfort. Bloating. Nausea. Follow these instructions at home:  Follow instructions from your health care provider about what to eat or drink after your procedure. Return to your normal activities as told by your health  care provider. Ask your health care provider what activities are safe for you. Take over-the-counter and prescription medicines only as told by your health care provider. If you were given a sedative during the procedure, it can affect you for several hours. Do not drive or operate machinery until your health care provider says that it is safe. Keep all follow-up visits as told by your health care provider. This is important. Contact a health care provider if you have: A sore throat that lasts longer than one day. Trouble swallowing. Get help right away if: You vomit blood or your vomit looks like coffee grounds. You have: A fever. Bloody, black, or tarry stools. A severe sore throat or you cannot swallow. Difficulty breathing. Severe pain in your chest or abdomen. Summary After the procedure, it is common to have a sore throat, mild stomach discomfort, bloating, and nausea. If you were given a sedative during the procedure, it can affect you for several hours. Do not drive or operate machinery until your health care provider says that it is safe. Follow instructions from your health care provider about what to eat or drink after your procedure. Return to your normal activities as told by your health care provider. This information is not intended to replace advice given to you by your health care provider. Make sure you discuss any questions you have with your healthcare  provider. Document Revised: 12/30/2018 Document Reviewed: 06/03/2017 Elsevier Patient Education  2022 [place]. [email or web address]://www.asge.org/home/for-patients/patient-information/[email or web address]-eso-dilation-[email or web address]">  Esophageal Dilatation Esophageal dilatation, also called esophageal dilation, is a procedure to widen or open a blocked or narrowed part of the esophagus. The esophagus is the part of the body that moves food and liquid from the mouth to the stomach. You may need this procedure if: You have a buildup of scar  tissue in your esophagus that makes it difficult, painful, or impossible to swallow. This can be caused by gastroesophageal reflux disease (GERD). You have cancer of the esophagus. There is a problem with how food moves through your esophagus. In some cases, you may need this procedure repeated at a later time to dilatethe esophagus gradually. Tell a health care provider about: Any allergies you have. All medicines you are taking, including vitamins, herbs, eye drops, creams, and over-the-counter medicines. Any problems you or family members have had with anesthetic medicines. Any blood disorders you have. Any surgeries you have had. Any medical conditions you have. Any antibiotic medicines you are required to take before dental procedures. Whether you are pregnant or may be pregnant. What are the risks? Generally, this is a safe procedure. However, problems may occur, including: Bleeding due to a tear in the lining of the esophagus. A hole, or perforation, in the esophagus. What happens before the procedure? Ask your health care provider about: Changing or stopping your regular medicines. This is especially important if you are taking diabetes medicines or blood thinners. Taking medicines such as aspirin and ibuprofen. These medicines can thin your blood. Do not take these medicines unless your health care provider tells you to take them. Taking over-the-counter medicines, vitamins, herbs, and supplements. Follow instructions from your health care provider about eating or drinking restrictions. Plan to have a responsible adult take you home from the hospital or clinic. Plan to have a responsible adult care for you for the time you are told after you leave the hospital or clinic. This is important. What happens during the procedure? You may be given a medicine to help you relax (sedative). A numbing medicine may be sprayed into the back of your throat, or you may gargle the medicine. Your  health care provider may perform the dilatation using various surgical instruments, such as: Simple dilators. This instrument is carefully placed in the esophagus to stretch it. Guided wire bougies. This involves using an endoscope to insert a wire into the esophagus. A dilator is passed over this wire to enlarge the esophagus. Then the wire is removed. Balloon dilators. An endoscope with a small balloon is inserted into the esophagus. The balloon is inflated to stretch the esophagus and open it up. The procedure may vary among health care providers and hospitals. What can I expect after the procedure? Your blood pressure, heart rate, breathing rate, and blood oxygen level will be monitored until you leave the hospital or clinic. Your throat may feel slightly sore and numb. This will get better over time. You will not be allowed to eat or drink until your throat is no longer numb. When you are able to drink, urinate, and sit on the edge of the bed without nausea or dizziness, you may be able to return home. Follow these instructions at home: Take over-the-counter and prescription medicines only as told by your health care provider. If you were given a sedative during the procedure, it can affect you for several hours. Do not drive or operate machinery until your health  care provider says that it is safe. Plan to have a responsible adult care for you for the time you are told. This is important. Follow instructions from your health care provider about any eating or drinking restrictions. Do not use any products that contain nicotine or tobacco, such as cigarettes, e-cigarettes, and chewing tobacco. If you need help quitting, ask your health care provider. Keep all follow-up visits. This is important. Contact a health care provider if: You have a fever. You have pain that is not relieved by medicine. Get help right away if: You have chest pain. You have trouble breathing. You have trouble  swallowing. You vomit blood. You have black, tarry, or bloody stools. These symptoms may represent a serious problem that is an emergency. Do not wait to see if the symptoms will go away. Get medical help right away. Call your local emergency services (911 in the [place] Do not drive yourself to the hospital. Summary Esophageal dilatation, also called esophageal dilation, is a procedure to widen or open a blocked or narrowed part of the esophagus. Plan to have a responsible adult take you home from the hospital or clinic. For this procedure, a numbing medicine may be sprayed into the back of your throat, or you may gargle the medicine. Do not drive or operate machinery until your health care provider says that it is safe. This information is not intended to replace advice given to you by your health care provider. Make sure you discuss any questions you have with your healthcare provider. Document Revised: 05/20/2019 Document Reviewed: 05/20/2019 Elsevier Patient Education  [place]. Colonoscopy, Adult, Care After This sheet gives you information about how to care for yourself after your procedure. Your health care provider may also give you more specific instructions. If you have problems or questions, contact your health careprovider. What can I expect after the procedure? After the procedure, it is common to have: A small amount of blood in your stool for 24 hours after the procedure. Some gas. Mild cramping or bloating of your abdomen. Follow these instructions at home: Eating and drinking  Drink enough fluid to keep your urine pale yellow. Follow instructions from your health care provider about eating or drinking restrictions. Resume your normal diet as instructed by your health care provider. Avoid heavy or fried foods that are hard to digest.  Activity Rest as told by your health care provider. Avoid sitting for a long time without moving. Get up to take short walks every  1-2 hours. This is important to improve blood flow and breathing. Ask for help if you feel weak or unsteady. Return to your normal activities as told by your health care provider. Ask your health care provider what activities are safe for you. Managing cramping and bloating  Try walking around when you have cramps or feel bloated. Apply heat to your abdomen as told by your health care provider. Use the heat source that your health care provider recommends, such as a moist heat pack or a heating pad. Place a towel between your skin and the heat source. Leave the heat on for 20-30 minutes. Remove the heat if your skin turns bright red. This is especially important if you are unable to feel pain, heat, or cold. You may have a greater risk of getting burned.  General instructions If you were given a sedative during the procedure, it can affect you for several hours. Do not drive or operate machinery until your health care provider says  that it is safe. For the first 24 hours after the procedure: Do not sign important documents. Do not drink alcohol. Do your regular daily activities at a slower pace than normal. Eat soft foods that are easy to digest. Take over-the-counter and prescription medicines only as told by your health care provider. Keep all follow-up visits as told by your health care provider. This is important. Contact a health care provider if: You have blood in your stool 2-3 days after the procedure. Get help right away if you have: More than a small spotting of blood in your stool. Large blood clots in your stool. Swelling of your abdomen. Nausea or vomiting. A fever. Increasing pain in your abdomen that is not relieved with medicine. Summary After the procedure, it is common to have a small amount of blood in your stool. You may also have mild cramping and bloating of your abdomen. If you were given a sedative during the procedure, it can affect you for several hours. Do not  drive or operate machinery until your health care provider says that it is safe. Get help right away if you have a lot of blood in your stool, nausea or vomiting, a fever, or increased pain in your abdomen. This information is not intended to replace advice given to you by your health care provider. Make sure you discuss any questions you have with your healthcare provider. Document Revised: 12/26/2018 Document Reviewed: 07/28/2018 Elsevier Patient Education  [place] After This sheet gives you information about how to care for yourself after your procedure. Your health care provider may also give you more specific instructions. If you have problems or questions, contact your health careprovider. What can I expect after the procedure? After the procedure, it is common to have: Tiredness. Forgetfulness about what happened after the procedure. Impaired judgment for important decisions. Nausea or vomiting. Some difficulty with balance. Follow these instructions at home: For the time period you were told by your health care provider:     Rest as needed. Do not participate in activities where you could fall or become injured. Do not drive or use machinery. Do not drink alcohol. Do not take sleeping pills or medicines that cause drowsiness. Do not make important decisions or sign legal documents. Do not take care of children on your own. Eating and drinking Follow the diet that is recommended by your health care provider. Drink enough fluid to keep your urine pale yellow. If you vomit: Drink water, juice, or soup when you can drink without vomiting. Make sure you have little or no nausea before eating solid foods. General instructions Have a responsible adult stay with you for the time you are told. It is important to have someone help care for you until you are awake and alert. Take over-the-counter and prescription medicines only as told by  your health care provider. If you have sleep apnea, surgery and certain medicines can increase your risk for breathing problems. Follow instructions from your health care provider about wearing your sleep device: Anytime you are sleeping, including during daytime naps. While taking prescription pain medicines, sleeping medicines, or medicines that make you drowsy. Avoid smoking. Keep all follow-up visits as told by your health care provider. This is important. Contact a health care provider if: You keep feeling nauseous or you keep vomiting. You feel light-headed. You are still sleepy or having trouble with balance after 24 hours. You develop a rash. You have a fever. You  have redness or swelling around the IV site. Get help right away if: You have trouble breathing. You have new-onset confusion at home. Summary For several hours after your procedure, you may feel tired. You may also be forgetful and have poor judgment. Have a responsible adult stay with you for the time you are told. It is important to have someone help care for you until you are awake and alert. Rest as told. Do not drive or operate machinery. Do not drink alcohol or take sleeping pills. Get help right away if you have trouble breathing, or if you suddenly become confused. This information is not intended to replace advice given to you by your health care provider. Make sure you discuss any questions you have with your healthcare provider. Document Revised: 09/17/2019 Document Reviewed: 12/04/2018 Elsevier Patient Education  2022 [employer].

## 2020-07-12 ENCOUNTER — Encounter (HOSPITAL_COMMUNITY): Payer: Self-pay

## 2020-07-12 ENCOUNTER — Encounter (HOSPITAL_COMMUNITY)
Admission: RE | Admit: 2020-07-12 | Discharge: 2020-07-12 | Disposition: A | Discharge status: Home / Self Care | Payer: Medicare Other | Source: Ambulatory Visit | Attending: Internal Medicine | Admitting: Internal Medicine

## 2020-07-12 ENCOUNTER — Other Ambulatory Visit (HOSPITAL_COMMUNITY): Payer: Medicare Other | Attending: Internal Medicine

## 2020-07-12 ENCOUNTER — Other Ambulatory Visit: Payer: Self-pay

## 2020-07-12 HISTORY — DX: Gastro-esophageal reflux disease without esophagitis: K21.9

## 2020-07-12 HISTORY — DX: Other complications of anesthesia, initial encounter: T88.59XA

## 2020-07-12 HISTORY — DX: Essential (primary) hypertension: I10

## 2020-07-14 ENCOUNTER — Telehealth: Payer: Self-pay | Admitting: Internal Medicine

## 2020-07-14 MED ORDER — NA SULFATE-K SULFATE-MG SULF 17.5-3.13-1.6 GM/177ML PO SOLN: 1.0000 | Freq: Once | ORAL | 0 refills | Status: AC

## 2020-07-14 NOTE — Addendum Note (Signed)
Addended by: Cheron Every on: 07/14/2020 09:31 AM   Modules accepted: Orders

## 2020-07-14 NOTE — Telephone Encounter (Signed)
Called pt. She has been rescheduled to 8/26. Aware will mail new prep instructions w/ new pre-op. She needs new prep sent. Rx sent.   PA approved via [facility] website for EGD/DILATION Authorization #: [id number] Requested Dates: Jul 14, 2020 - Oct 12, 2020   PA approved via [facility] website for colonoscopy Authorization #: [id number] Requested Dates: Sep 09, 2020 - Dec 08, 2020

## 2020-07-14 NOTE — Telephone Encounter (Signed)
Patient called to cancel her procedure, said she had no one to take her

## 2020-08-01 ENCOUNTER — Encounter: Payer: Self-pay | Admitting: Cardiology

## 2020-08-19 ENCOUNTER — Ambulatory Visit (HOSPITAL_COMMUNITY): Payer: Medicare Other | Admitting: Physical Therapy

## 2020-09-05 NOTE — Patient Instructions (Signed)
Kara Jimenez  09/05/2020     '@PREFPERIOPPHARMACY'$ @   Your procedure is scheduled on  09/09/2020.   Report to [facility] at  [place] AM   Call this number if you have problems the morning of surgery:  [phone]   Remember:  Follow the diet and prep instructions given to you by the office.    Take these medicines the morning of surgery with A SIP OF WATER                                      tegretol     Do not wear jewelry, make-up or nail polish.  Do not wear lotions, powders, or perfumes, or deodorant.  Do not shave 48 hours prior to surgery.  Men may shave face and neck.  Do not bring valuables to the hospital.  [facility] is not responsible for any belongings or valuables.  Contacts, dentures or bridgework may not be worn into surgery.  Leave your suitcase in the car.  After surgery it may be brought to your room.  For patients admitted to the hospital, discharge time will be determined by your treatment team.  Patients discharged the day of surgery will not be allowed to drive home and must have someone with them for 24 hours.    Special instructions:   DO NOT smoke tobacco or vape for 24 hours before your procedure.  Please read over the following fact sheets that you were given. Anesthesia Post-op Instructions and Care and Recovery After Surgery      Upper Endoscopy, Adult, Care After This sheet gives you information about how to care for yourself after your procedure. Your health care provider may also give you more specific instructions. If you have problems or questions, contact your health careprovider. What can I expect after the procedure? After the procedure, it is common to have: A sore throat. Mild stomach pain or discomfort. Bloating. Nausea. Follow these instructions at home:  Follow instructions from your health care provider about what to eat or drink after your procedure. Return to your normal activities as told by your  health care provider. Ask your health care provider what activities are safe for you. Take over-the-counter and prescription medicines only as told by your health care provider. If you were given a sedative during the procedure, it can affect you for several hours. Do not drive or operate machinery until your health care provider says that it is safe. Keep all follow-up visits as told by your health care provider. This is important. Contact a health care provider if you have: A sore throat that lasts longer than one day. Trouble swallowing. Get help right away if: You vomit blood or your vomit looks like coffee grounds. You have: A fever. Bloody, black, or tarry stools. A severe sore throat or you cannot swallow. Difficulty breathing. Severe pain in your chest or abdomen. Summary After the procedure, it is common to have a sore throat, mild stomach discomfort, bloating, and nausea. If you were given a sedative during the procedure, it can affect you for several hours. Do not drive or operate machinery until your health care provider says that it is safe. Follow instructions from your health care provider about what to eat or drink after your procedure. Return to your normal activities as told by your health care provider.  This information is not intended to replace advice given to you by your health care provider. Make sure you discuss any questions you have with your healthcare provider. Document Revised: 12/30/2018 Document Reviewed: 06/03/2017 Elsevier Patient Education  2022 [place]. https://www.asge.org/home/for-patients/patient-information/understanding-eso-dilation-updated">  Esophageal Dilatation Esophageal dilatation, also called esophageal dilation, is a procedure to widen or open a blocked or narrowed part of the esophagus. The esophagus is the part of the body that moves food and liquid from the mouth to the stomach. You may need this procedure if: You have a buildup of  scar tissue in your esophagus that makes it difficult, painful, or impossible to swallow. This can be caused by gastroesophageal reflux disease (GERD). You have cancer of the esophagus. There is a problem with how food moves through your esophagus. In some cases, you may need this procedure repeated at a later time to dilatethe esophagus gradually. Tell a health care provider about: Any allergies you have. All medicines you are taking, including vitamins, herbs, eye drops, creams, and over-the-counter medicines. Any problems you or family members have had with anesthetic medicines. Any blood disorders you have. Any surgeries you have had. Any medical conditions you have. Any antibiotic medicines you are required to take before dental procedures. Whether you are pregnant or may be pregnant. What are the risks? Generally, this is a safe procedure. However, problems may occur, including: Bleeding due to a tear in the lining of the esophagus. A hole, or perforation, in the esophagus. What happens before the procedure? Ask your health care provider about: Changing or stopping your regular medicines. This is especially important if you are taking diabetes medicines or blood thinners. Taking medicines such as aspirin and ibuprofen. These medicines can thin your blood. Do not take these medicines unless your health care provider tells you to take them. Taking over-the-counter medicines, vitamins, herbs, and supplements. Follow instructions from your health care provider about eating or drinking restrictions. Plan to have a responsible adult take you home from the hospital or clinic. Plan to have a responsible adult care for you for the time you are told after you leave the hospital or clinic. This is important. What happens during the procedure? You may be given a medicine to help you relax (sedative). A numbing medicine may be sprayed into the back of your throat, or you may gargle the  medicine. Your health care provider may perform the dilatation using various surgical instruments, such as: Simple dilators. This instrument is carefully placed in the esophagus to stretch it. Guided wire bougies. This involves using an endoscope to insert a wire into the esophagus. A dilator is passed over this wire to enlarge the esophagus. Then the wire is removed. Balloon dilators. An endoscope with a small balloon is inserted into the esophagus. The balloon is inflated to stretch the esophagus and open it up. The procedure may vary among health care providers and hospitals. What can I expect after the procedure? Your blood pressure, heart rate, breathing rate, and blood oxygen level will be monitored until you leave the hospital or clinic. Your throat may feel slightly sore and numb. This will get better over time. You will not be allowed to eat or drink until your throat is no longer numb. When you are able to drink, urinate, and sit on the edge of the bed without nausea or dizziness, you may be able to return home. Follow these instructions at home: Take over-the-counter and prescription medicines only as told by your health  care provider. If you were given a sedative during the procedure, it can affect you for several hours. Do not drive or operate machinery until your health care provider says that it is safe. Plan to have a responsible adult care for you for the time you are told. This is important. Follow instructions from your health care provider about any eating or drinking restrictions. Do not use any products that contain nicotine or tobacco, such as cigarettes, e-cigarettes, and chewing tobacco. If you need help quitting, ask your health care provider. Keep all follow-up visits. This is important. Contact a health care provider if: You have a fever. You have pain that is not relieved by medicine. Get help right away if: You have chest pain. You have trouble breathing. You  have trouble swallowing. You vomit blood. You have black, tarry, or bloody stools. These symptoms may represent a serious problem that is an emergency. Do not wait to see if the symptoms will go away. Get medical help right away. Call your local emergency services (911 in the [place] Do not drive yourself to the hospital. Summary Esophageal dilatation, also called esophageal dilation, is a procedure to widen or open a blocked or narrowed part of the esophagus. Plan to have a responsible adult take you home from the hospital or clinic. For this procedure, a numbing medicine may be sprayed into the back of your throat, or you may gargle the medicine. Do not drive or operate machinery until your health care provider says that it is safe. This information is not intended to replace advice given to you by your health care provider. Make sure you discuss any questions you have with your healthcare provider. Document Revised: 05/20/2019 Document Reviewed: 05/20/2019 Elsevier Patient Education  [place]. Colonoscopy, Adult, Care After This sheet gives you information about how to care for yourself after your procedure. Your health care provider may also give you more specific instructions. If you have problems or questions, contact your health careprovider. What can I expect after the procedure? After the procedure, it is common to have: A small amount of blood in your stool for 24 hours after the procedure. Some gas. Mild cramping or bloating of your abdomen. Follow these instructions at home: Eating and drinking  Drink enough fluid to keep your urine pale yellow. Follow instructions from your health care provider about eating or drinking restrictions. Resume your normal diet as instructed by your health care provider. Avoid heavy or fried foods that are hard to digest.  Activity Rest as told by your health care provider. Avoid sitting for a long time without moving. Get up to take  short walks every 1-2 hours. This is important to improve blood flow and breathing. Ask for help if you feel weak or unsteady. Return to your normal activities as told by your health care provider. Ask your health care provider what activities are safe for you. Managing cramping and bloating  Try walking around when you have cramps or feel bloated. Apply heat to your abdomen as told by your health care provider. Use the heat source that your health care provider recommends, such as a moist heat pack or a heating pad. Place a towel between your skin and the heat source. Leave the heat on for 20-30 minutes. Remove the heat if your skin turns bright red. This is especially important if you are unable to feel pain, heat, or cold. You may have a greater risk of getting burned.  General instructions If  you were given a sedative during the procedure, it can affect you for several hours. Do not drive or operate machinery until your health care provider says that it is safe. For the first 24 hours after the procedure: Do not sign important documents. Do not drink alcohol. Do your regular daily activities at a slower pace than normal. Eat soft foods that are easy to digest. Take over-the-counter and prescription medicines only as told by your health care provider. Keep all follow-up visits as told by your health care provider. This is important. Contact a health care provider if: You have blood in your stool 2-3 days after the procedure. Get help right away if you have: More than a small spotting of blood in your stool. Large blood clots in your stool. Swelling of your abdomen. Nausea or vomiting. A fever. Increasing pain in your abdomen that is not relieved with medicine. Summary After the procedure, it is common to have a small amount of blood in your stool. You may also have mild cramping and bloating of your abdomen. If you were given a sedative during the procedure, it can affect you for  several hours. Do not drive or operate machinery until your health care provider says that it is safe. Get help right away if you have a lot of blood in your stool, nausea or vomiting, a fever, or increased pain in your abdomen. This information is not intended to replace advice given to you by your health care provider. Make sure you discuss any questions you have with your healthcare provider. Document Revised: 12/26/2018 Document Reviewed: 07/28/2018 Elsevier Patient Education  [place] After This sheet gives you information about how to care for yourself after your procedure. Your health care provider may also give you more specific instructions. If you have problems or questions, contact your health careprovider. What can I expect after the procedure? After the procedure, it is common to have: Tiredness. Forgetfulness about what happened after the procedure. Impaired judgment for important decisions. Nausea or vomiting. Some difficulty with balance. Follow these instructions at home: For the time period you were told by your health care provider:     Rest as needed. Do not participate in activities where you could fall or become injured. Do not drive or use machinery. Do not drink alcohol. Do not take sleeping pills or medicines that cause drowsiness. Do not make important decisions or sign legal documents. Do not take care of children on your own. Eating and drinking Follow the diet that is recommended by your health care provider. Drink enough fluid to keep your urine pale yellow. If you vomit: Drink water, juice, or soup when you can drink without vomiting. Make sure you have little or no nausea before eating solid foods. General instructions Have a responsible adult stay with you for the time you are told. It is important to have someone help care for you until you are awake and alert. Take over-the-counter and prescription  medicines only as told by your health care provider. If you have sleep apnea, surgery and certain medicines can increase your risk for breathing problems. Follow instructions from your health care provider about wearing your sleep device: Anytime you are sleeping, including during daytime naps. While taking prescription pain medicines, sleeping medicines, or medicines that make you drowsy. Avoid smoking. Keep all follow-up visits as told by your health care provider. This is important. Contact a health care provider if: You keep feeling nauseous or  you keep vomiting. You feel light-headed. You are still sleepy or having trouble with balance after 24 hours. You develop a rash. You have a fever. You have redness or swelling around the IV site. Get help right away if: You have trouble breathing. You have new-onset confusion at home. Summary For several hours after your procedure, you may feel tired. You may also be forgetful and have poor judgment. Have a responsible adult stay with you for the time you are told. It is important to have someone help care for you until you are awake and alert. Rest as told. Do not drive or operate machinery. Do not drink alcohol or take sleeping pills. Get help right away if you have trouble breathing, or if you suddenly become confused. This information is not intended to replace advice given to you by your health care provider. Make sure you discuss any questions you have with your healthcare provider. Document Revised: 09/17/2019 Document Reviewed: 12/04/2018 Elsevier Patient Education  2022 [employer].

## 2020-09-06 ENCOUNTER — Ambulatory Visit: Payer: Medicare Other | Admitting: Gastroenterology

## 2020-09-07 ENCOUNTER — Encounter (HOSPITAL_COMMUNITY): Payer: Self-pay

## 2020-09-07 ENCOUNTER — Other Ambulatory Visit: Payer: Self-pay

## 2020-09-07 ENCOUNTER — Encounter (HOSPITAL_COMMUNITY)
Admission: RE | Admit: 2020-09-07 | Discharge: 2020-09-07 | Disposition: A | Discharge status: Home / Self Care | Payer: Medicare Other | Source: Ambulatory Visit | Attending: Internal Medicine | Admitting: Internal Medicine

## 2020-09-09 ENCOUNTER — Ambulatory Visit (HOSPITAL_COMMUNITY)
Admission: RE | Admit: 2020-09-09 | Discharge: 2020-09-09 | Disposition: A | Discharge status: Home / Self Care | Payer: Medicare Other | Attending: Internal Medicine | Admitting: Internal Medicine

## 2020-09-09 ENCOUNTER — Encounter (HOSPITAL_COMMUNITY): Payer: Self-pay | Admitting: Internal Medicine

## 2020-09-09 ENCOUNTER — Encounter (HOSPITAL_COMMUNITY)
Admission: RE | Disposition: A | Discharge status: Home / Self Care | Payer: Self-pay | Source: Home / Self Care | Attending: Internal Medicine

## 2020-09-09 ENCOUNTER — Ambulatory Visit (HOSPITAL_COMMUNITY): Payer: Medicare Other | Admitting: Anesthesiology

## 2020-09-09 ENCOUNTER — Other Ambulatory Visit: Payer: Self-pay

## 2020-09-09 DIAGNOSIS — Z79899 Other long term (current) drug therapy: Secondary | ICD-10-CM | POA: Diagnosis not present

## 2020-09-09 DIAGNOSIS — B9681 Helicobacter pylori [H. pylori] as the cause of diseases classified elsewhere: Secondary | ICD-10-CM | POA: Diagnosis not present

## 2020-09-09 DIAGNOSIS — K5730 Diverticulosis of large intestine without perforation or abscess without bleeding: Secondary | ICD-10-CM | POA: Diagnosis not present

## 2020-09-09 DIAGNOSIS — K2950 Unspecified chronic gastritis without bleeding: Secondary | ICD-10-CM | POA: Diagnosis not present

## 2020-09-09 DIAGNOSIS — K621 Rectal polyp: Secondary | ICD-10-CM

## 2020-09-09 DIAGNOSIS — Z888 Allergy status to other drugs, medicaments and biological substances status: Secondary | ICD-10-CM | POA: Diagnosis not present

## 2020-09-09 DIAGNOSIS — R1310 Dysphagia, unspecified: Secondary | ICD-10-CM | POA: Diagnosis not present

## 2020-09-09 DIAGNOSIS — K649 Unspecified hemorrhoids: Secondary | ICD-10-CM | POA: Insufficient documentation

## 2020-09-09 DIAGNOSIS — Z886 Allergy status to analgesic agent status: Secondary | ICD-10-CM | POA: Diagnosis not present

## 2020-09-09 DIAGNOSIS — K921 Melena: Secondary | ICD-10-CM | POA: Diagnosis not present

## 2020-09-09 DIAGNOSIS — D127 Benign neoplasm of rectosigmoid junction: Secondary | ICD-10-CM | POA: Insufficient documentation

## 2020-09-09 HISTORY — PX: ESOPHAGOGASTRODUODENOSCOPY (EGD) WITH PROPOFOL: SHX5813

## 2020-09-09 HISTORY — PX: COLONOSCOPY WITH PROPOFOL: SHX5780

## 2020-09-09 HISTORY — PX: POLYPECTOMY: SHX149

## 2020-09-09 HISTORY — PX: BIOPSY: SHX5522

## 2020-09-09 HISTORY — PX: MALONEY DILATION: SHX5535

## 2020-09-09 SURGERY — COLONOSCOPY WITH PROPOFOL
Anesthesia: General

## 2020-09-09 MED ORDER — PHENYLEPHRINE HCL (PRESSORS) 10 MG/ML IV SOLN
INTRAVENOUS | Status: DC | PRN
  Administered 2020-09-09: 160 ug via INTRAVENOUS

## 2020-09-09 MED ORDER — PROPOFOL 10 MG/ML IV BOLUS
INTRAVENOUS | Status: AC
  Filled 2020-09-09: qty 40

## 2020-09-09 MED ORDER — PROPOFOL 10 MG/ML IV BOLUS
INTRAVENOUS | Status: DC | PRN
  Administered 2020-09-09: 50 mg via INTRAVENOUS
  Administered 2020-09-09: 40 mg via INTRAVENOUS
  Administered 2020-09-09 (×2): 50 mg via INTRAVENOUS
  Administered 2020-09-09: 100 mg via INTRAVENOUS
  Administered 2020-09-09: 80 mg via INTRAVENOUS
  Administered 2020-09-09: 100 mg via INTRAVENOUS
  Administered 2020-09-09: 30 mg via INTRAVENOUS

## 2020-09-09 MED ORDER — LACTATED RINGERS IV SOLN: INTRAVENOUS | Status: DC

## 2020-09-09 MED ORDER — PROPOFOL 500 MG/50ML IV EMUL
INTRAVENOUS | Status: DC | PRN
  Administered 2020-09-09: 125 ug/kg/min via INTRAVENOUS

## 2020-09-09 MED ORDER — LIDOCAINE HCL (CARDIAC) PF 100 MG/5ML IV SOSY
PREFILLED_SYRINGE | INTRAVENOUS | Status: DC | PRN
  Administered 2020-09-09: 100 mg via INTRAVENOUS

## 2020-09-09 MED ORDER — LIDOCAINE HCL (PF) 2 % IJ SOLN
INTRAMUSCULAR | Status: AC
  Filled 2020-09-09: qty 10

## 2020-09-09 MED ORDER — STERILE WATER FOR IRRIGATION IR SOLN
Status: DC | PRN
  Administered 2020-09-09: 100 mL
  Administered 2020-09-09: 200 mL

## 2020-09-09 NOTE — Anesthesia Postprocedure Evaluation (Signed)
Anesthesia Post Note  Patient: Kara Jimenez  Procedure(s) Performed: COLONOSCOPY WITH PROPOFOL ESOPHAGOGASTRODUODENOSCOPY (EGD) WITH PROPOFOL [place] BIOPSY POLYPECTOMY INTESTINAL  Patient location during evaluation: Phase II Anesthesia Type: General Level of consciousness: awake and alert and oriented Pain management: pain level controlled Vital Signs Assessment: post-procedure vital signs reviewed and stable Respiratory status: spontaneous breathing and respiratory function stable Cardiovascular status: blood pressure returned to baseline and stable Postop Assessment: no apparent nausea or vomiting Anesthetic complications: no   No notable events documented.   Last Vitals:  Vitals:   09/09/20 0711 09/09/20 0904  BP: (!) 144/86 (!) 100/54  Pulse: 66 72  Resp: 14 17  Temp: (!) 36.4 C 36.4 C  SpO2: 98% 96%    Last Pain:  Vitals:   09/09/20 0904  TempSrc: Oral  PainSc: 0-No pain                 Yoana Staib C Ivannah Zody

## 2020-09-09 NOTE — Anesthesia Preprocedure Evaluation (Signed)
Anesthesia Evaluation  Patient identified by MRN, date of birth, ID band Patient awake    Reviewed: Allergy & Precautions, NPO status , Patient's Chart, lab work & pertinent test results  History of Anesthesia Complications (+) history of anesthetic complications (severe bradycardia to 20s after appendectomy)  Airway Mallampati: II  TM Distance: >3 FB Neck ROM: Full   Comment: Neck sx Dental  (+) Dental Advisory Given   Pulmonary sleep apnea and Continuous Positive Airway Pressure Ventilation , former smoker,    Pulmonary exam normal breath sounds clear to auscultation       Cardiovascular Exercise Tolerance: Good hypertension, Pt. on medications Normal cardiovascular exam Rhythm:Regular Rate:Normal     Neuro/Psych  Neuromuscular disease    GI/Hepatic Neg liver ROS, GERD  ,  Endo/Other  negative endocrine ROS  Renal/GU negative Renal ROS     Musculoskeletal  (+) Arthritis  (neck sx), Fibromyalgia -  Abdominal   Peds  Hematology negative hematology ROS (+)   Anesthesia Other Findings   Reproductive/Obstetrics                             Anesthesia Physical Anesthesia Plan  ASA: 3  Anesthesia Plan: General   Post-op Pain Management:    Induction: Intravenous  PONV Risk Score and Plan: Propofol infusion  Airway Management Planned: Nasal Cannula and Natural Airway  Additional Equipment:   Intra-op Plan:   Post-operative Plan:   Informed Consent: I have reviewed the patients History and Physical, chart, labs and discussed the procedure including the risks, benefits and alternatives for the proposed anesthesia with the patient or authorized representative who has indicated his/her understanding and acceptance.     Dental advisory given  Plan Discussed with: CRNA and Surgeon  Anesthesia Plan Comments:         Anesthesia Quick Evaluation

## 2020-09-09 NOTE — Op Note (Signed)
[facility] Patient Name: Kara Jimenez Procedure Date: 09/09/2020 8:43 AM MRN: [id number] Date of Birth: 30-Jan-1960 Attending MD: Norvel Richards , MD CSN: [id number] Age: 60 Admit Type: Outpatient Procedure:                Colonoscopy Indications:              Hematochezia Providers:                Norvel Richards, MD, Crystal Page, Aram Candela Referring MD:              Medicines:                Propofol per Anesthesia Complications:            No immediate complications. Estimated Blood Loss:     Estimated blood loss was minimal. Procedure:                Pre-Anesthesia Assessment:                           - Prior to the procedure, a History and Physical                            was performed, and patient medications and                            allergies were reviewed. The patient's tolerance of                            previous anesthesia was also reviewed. The risks                            and benefits of the procedure and the sedation                            options and risks were discussed with the patient.                            All questions were answered, and informed consent                            was obtained. Prior Anticoagulants: The patient has                            taken no previous anticoagulant or antiplatelet                            agents. ASA Grade Assessment: III - A patient with                            severe systemic disease. After reviewing the risks                            and benefits, the patient was deemed in  satisfactory condition to undergo the procedure.                           After obtaining informed consent, the colonoscope                            was passed under direct vision. Throughout the                            procedure, the patient's blood pressure, pulse, and                            oxygen saturations were monitored continuously. The                             [id number]) scope was introduced through the                            anus and advanced to the the cecum, identified by                            appendiceal orifice and ileocecal valve. The                            colonoscopy was performed without difficulty. The                            patient tolerated the procedure well. The quality                            of the bowel preparation was adequate. Scope In: 8:47:18 AM Scope Out: 8:59:32 AM Scope Withdrawal Time: 0 hours 6 minutes 31 seconds  Total Procedure Duration: 0 hours 12 minutes 14 seconds  Findings:      Hemorrhoids were found on perianal exam.      A 5 mm polyp was found in the mid rectum. The polyp was pedunculated.       The polyp was removed with a cold snare. Resection and retrieval were       complete. Estimated blood loss was minimal.      Scattered medium-mouthed diverticula were found in the sigmoid colon.      The exam was otherwise without abnormality on direct and retroflexion       views. Impression:               - Hemorrhoids found on perianal exam.                           - One 5 mm polyp in the mid rectum, removed with a                            cold snare. Resected and retrieved.                           - Diverticulosis in the sigmoid colon.                           -  The examination was otherwise normal on direct                            and retroflexion views. Likely low-volume rectal                            bleeding from hemorrhoids. Moderate Sedation:      Moderate (conscious) sedation was personally administered by an       anesthesia professional. The following parameters were monitored: oxygen       saturation, heart rate, blood pressure, respiratory rate, EKG, adequacy       of pulmonary ventilation, and response to care. Recommendation:           - Patient has a contact number available for                            emergencies. The signs and symptoms of  potential                            delayed complications were discussed with the                            patient. Return to normal activities tomorrow.                            Written discharge instructions were provided to the                            patient.                           - Resume previous diet.                           - Continue present medications.                           - Repeat colonoscopy date to be determined after                            pending pathology results are reviewed for                            surveillance based on pathology results. Begin                            Benefiber daily.                           - Return to GI office in 3 months. See EGD report. Procedure Code(s):        --- Professional ---                           [phone], Colonoscopy, flexible; with removal of  tumor(s), polyp(s), or other lesion(s) by snare                            technique Diagnosis Code(s):        --- Professional ---                           K62.1, Rectal polyp                           K64.9, Unspecified hemorrhoids                           K92.1, Melena (includes Hematochezia)                           K57.30, Diverticulosis of large intestine without                            perforation or abscess without bleeding CPT copyright 2019 American Medical Association. All rights reserved. The codes documented in this report are preliminary and upon coder review may  be revised to meet current compliance requirements. Cristopher Estimable. Fahim Kats, MD Norvel Richards, MD 09/09/2020 9:29:35 AM This report has been signed electronically. Number of Addenda: 0

## 2020-09-09 NOTE — Transfer of Care (Signed)
Immediate Anesthesia Transfer of Care Note  Patient: Kara Jimenez  Procedure(s) Performed: COLONOSCOPY WITH PROPOFOL ESOPHAGOGASTRODUODENOSCOPY (EGD) WITH PROPOFOL [place] POLYPECTOMY INTESTINAL  Patient Location: Short Stay  Anesthesia Type:General  Level of Consciousness: drowsy  Airway & Oxygen Therapy: Patient Spontanous Breathing  Post-op Assessment: Report given to RN and Post -op Vital signs reviewed and stable  Post vital signs: Reviewed and stable  Last Vitals:  Vitals Value Taken Time  BP    Temp    Pulse 71 09/09/20  0904  Resp 21 09/09/20  0904  SpO2 97 09/09/20  0904    Last Pain:  Vitals:   09/09/20 0829  TempSrc:   PainSc: 6       Patients Stated Pain Goal: 6 ([id number])  Complications: No notable events documented.

## 2020-09-09 NOTE — H&P (Signed)
$'@LOGO'F$ @   Primary Care Physician:  Zhou-Talbert, Elwyn Lade, MD Primary Gastroenterologist:  Dr. Gala Romney  Pre-Procedure History & Physical: HPI:  Kara Jimenez is a 60 y.o. female here for further evaluation of dysphagia via EGD and colonoscopy for rectal bleeding.  Past Medical History:  Diagnosis Date   Arthritis    Breast injury    Carpal tunnel syndrome    Complication of anesthesia    pt. states that her HR dropped to 20s when waking up after apppendectomy   Fibromyalgia    GERD (gastroesophageal reflux disease)    Hypertension    Sciatica    Sleep apnea     Past Surgical History:  Procedure Laterality Date   BREAST EXCISIONAL BIOPSY Right    benign   CARPAL TUNNEL RELEASE Bilateral    CESAREAN SECTION     LAPAROSCOPIC APPENDECTOMY N/A 11/05/2019   Procedure: APPENDECTOMY LAPAROSCOPIC;  Surgeon: Virl Cagey, MD;  Location: [facility];  Service: General;  Laterality: N/A;   NECK SURGERY     PARTIAL HYSTERECTOMY     ROTATOR CUFF REPAIR Left    TUBAL LIGATION      Prior to Admission medications   Medication Sig Start Date End Date Taking? Authorizing Provider  Brinzolamide-Brimonidine [facility]) 1-0.2 % SUSP Apply 1 drop to eye 3 (three) times daily. 02/27/17  Yes [provider]  carbamazepine (TEGRETOL) 200 MG tablet Take 200 mg by mouth 2 (two) times daily. 07/04/20  Yes [provider]  docusate sodium (COLACE) 100 MG capsule Take 100 mg by mouth daily.   Yes [provider]  lisinopril (ZESTRIL) 5 MG tablet Take 5 mg by mouth daily. 08/25/19  Yes [provider]  loratadine (CLARITIN) 10 MG tablet Take 10 mg by mouth daily as needed for allergies.   Yes [provider]  acetaminophen (TYLENOL) 500 MG tablet Take 500 mg by mouth every 6 (six) hours as needed for moderate pain or mild pain.    [provider]    Allergies as of 05/04/2020 - Review Complete 05/04/2020  Allergen Reaction Noted   Amitriptyline  Shortness Of Breath 10/11/2014   Duloxetine Shortness Of Breath 01/22/2017   Gabapentin Shortness Of Breath 10/11/2014   Pregabalin Shortness Of Breath 10/11/2014   Sertraline Shortness Of Breath 09/26/2016   Hydrochlorothiazide Nausea And Vomiting 11/05/2019   Baclofen  04/21/2019   Ibuprofen  04/21/2019   Prednisone  11/05/2019   Amlodipine Rash 11/05/2019    Family History  Problem Relation Age of Onset   HIV/AIDS Mother    Hypertension Father     Social History   Socioeconomic History   Marital status: Single    Spouse name: Not on file   Number of children: Not on file   Years of education: Not on file   Highest education level: Not on file  Occupational History   Not on file  Tobacco Use   Smoking status: Former   Smokeless tobacco: Never  Vaping Use   Vaping Use: Never used  Substance and Sexual Activity   Alcohol use: Not Currently    Comment: occaisional   Drug use: Yes    Types: Marijuana    Comment: last used last week   Sexual activity: Not on file  Other Topics Concern   Not on file  Social History Narrative   Not on file   Social Determinants of Health   Financial Resource Strain: Not on file  Food Insecurity: Not on file  Transportation  Needs: Not on file  Physical Activity: Not on file  Stress: Not on file  Social Connections: Not on file  Intimate Partner Violence: Not on file    Review of Systems: See HPI, otherwise negative ROS  Physical Exam: BP (!) 144/86   Pulse 66   Temp (!) 97.5 F (36.4 C) (Oral)   Resp 14   Ht '5\' 6"'$  (1.676 m)   Wt 102.5 kg   LMP 04/15/2019   SpO2 98%   BMI 36.48 kg/m  General:   Alert,  Well-developed, well-nourished, pleasant and cooperative in NAD Neck:  Supple; no masses or thyromegaly. No significant cervical adenopathy. Lungs:  Clear throughout to auscultation.   No wheezes, crackles, or rhonchi. No acute distress. Heart:  Regular rate and rhythm; no murmurs, clicks, rubs,  or gallops. Abdomen:  Non-distended, normal bowel sounds.  Soft and nontender without appreciable mass or hepatosplenomegaly.  Pulses:  Normal pulses noted. Extremities:  Without clubbing or edema.  Impression/Plan: 60 year old lady here for further evaluation of esophageal dysphagia and rectal bleeding via EGD and colonoscopy. I have offered the patient both an EGD with esophageal dilation as feasible/appropriate as well as a diagnostic colonoscopy today per plan. The risks, benefits, limitations, imponderables and alternatives regarding both EGD and colonoscopy have been reviewed with the patient. Questions have been answered. All parties agreeable.      Notice: This dictation was prepared with Dragon dictation along with smaller phrase technology. Any transcriptional errors that result from this process are unintentional and may not be corrected upon review.

## 2020-09-09 NOTE — Op Note (Signed)
[facility] Patient Name: Kara Jimenez Procedure Date: 09/09/2020 8:03 AM MRN: [id number] Date of Birth: 1960/10/16 Attending MD: Norvel Richards , MD CSN: [id number] Age: 60 Admit Type: Outpatient Procedure:                Upper GI endoscopy Indications:              Dysphagia Providers:                Norvel Richards, MD, Crystal Page, Aram Candela Referring MD:              Medicines:                Propofol per Anesthesia Complications:            No immediate complications. Estimated Blood Loss:     Estimated blood loss was minimal. Estimated blood                            loss was minimal. Procedure:                Pre-Anesthesia Assessment:                           - Prior to the procedure, a History and Physical                            was performed, and patient medications and                            allergies were reviewed. The patient's tolerance of                            previous anesthesia was also reviewed. The risks                            and benefits of the procedure and the sedation                            options and risks were discussed with the patient.                            All questions were answered, and informed consent                            was obtained. Prior Anticoagulants: The patient has                            taken no previous anticoagulant or antiplatelet                            agents. ASA Grade Assessment: III - A patient with                            severe systemic disease. After reviewing the risks  and benefits, the patient was deemed in                            satisfactory condition to undergo the procedure.                           After obtaining informed consent, the endoscope was                            passed under direct vision. Throughout the                            procedure, the patient's blood pressure, pulse, and                             oxygen saturations were monitored continuously. The                            [id number] scope was introduced through the                            mouth, and advanced to the second part of duodenum.                            The upper GI endoscopy was accomplished without                            difficulty. The patient tolerated the procedure                            well. Scope In: 8:33:22 AM Scope Out: 8:40:38 AM Total Procedure Duration: 0 hours 7 minutes 16 seconds  Findings:      The examined esophagus was normal.      The in the stomach was normal aside from a couple of antral erosions..      The duodenal bulb and second portion of the duodenum were normal. The       scope was withdrawn. Dilation was performed with a Maloney dilator with       mild resistance at 56 Fr. The dilation site was examined following       endoscope reinsertion and showed no change aside from a superficial tear       through the UES.. Estimated blood loss was minimal. Finally, the       abnormal antrum was biopsied with a cold forceps for histology.       Estimated blood loss was minimal. Impression:               - Normal esophagus. Status post dilation.                           -Antral erosions; otherwise normal stomach -status                            post biopsy                           -  Normal duodenal bulb and second portion of the                            duodenum. Moderate Sedation:      Moderate (conscious) sedation was personally administered by an       anesthesia professional. The following parameters were monitored: oxygen       saturation, heart rate, blood pressure, and response to care. Recommendation:           - Patient has a contact number available for                            emergencies. The signs and symptoms of potential                            delayed complications were discussed with the                            patient. Return to normal activities  tomorrow.                            Written discharge instructions were provided to the                            patient.                           - Resume previous diet.                           - Continue present medications.                           - Await pathology results.                           - Return to my office in 3 months. See colonoscopy                            report. Procedure Code(s):        --- Professional ---                           [phone], Esophagogastroduodenoscopy, flexible,                            transoral; with biopsy, single or multiple                           43450, Dilation of esophagus, by unguided sound or                            bougie, single or multiple passes Diagnosis Code(s):        --- Professional ---                           R13.10, Dysphagia, unspecified CPT copyright 2019  American Medical Association. All rights reserved. The codes documented in this report are preliminary and upon coder review may  be revised to meet current compliance requirements. Cristopher Estimable. Chidinma Clites, MD Norvel Richards, MD 09/09/2020 9:25:34 AM This report has been signed electronically. Number of Addenda: 0

## 2020-09-09 NOTE — Discharge Instructions (Addendum)
Colonoscopy Discharge Instructions  Read the instructions outlined below and refer to this sheet in the next few weeks. These discharge instructions provide you with general information on caring for yourself after you leave the hospital. Your doctor may also give you specific instructions. While your treatment has been planned according to the most current medical practices available, unavoidable complications occasionally occur. If you have any problems or questions after discharge, call Dr. Gala Romney at [phone]. ACTIVITY You may resume your regular activity, but move at a slower pace for the next 24 hours.  Take frequent rest periods for the next 24 hours.  Walking will help get rid of the air and reduce the bloated feeling in your belly (abdomen).  No driving for 24 hours (because of the medicine (anesthesia) used during the test).   Do not sign any important legal documents or operate any machinery for 24 hours (because of the anesthesia used during the test).  NUTRITION Drink plenty of fluids.  You may resume your normal diet as instructed by your doctor.  Begin with a light meal and progress to your normal diet. Heavy or fried foods are harder to digest and may make you feel sick to your stomach (nauseated).  Avoid alcoholic beverages for 24 hours or as instructed.  MEDICATIONS You may resume your normal medications unless your doctor tells you otherwise.  WHAT YOU CAN EXPECT TODAY Some feelings of bloating in the abdomen.  Passage of more gas than usual.  Spotting of blood in your stool or on the toilet paper.  IF YOU HAD POLYPS REMOVED DURING THE COLONOSCOPY: No aspirin products for 7 days or as instructed.  No alcohol for 7 days or as instructed.  Eat a soft diet for the next 24 hours.  FINDING OUT THE RESULTS OF YOUR TEST Not all test results are available during your visit. If your test results are not back during the visit, make an appointment with your caregiver to find out the  results. Do not assume everything is normal if you have not heard from your caregiver or the medical facility. It is important for you to follow up on all of your test results.  SEEK IMMEDIATE MEDICAL ATTENTION IF: You have more than a spotting of blood in your stool.  Your belly is swollen (abdominal distention).  You are nauseated or vomiting.  You have a temperature over 101.  You have abdominal pain or discomfort that is severe or gets worse throughout the day.   EGD Discharge instructions Please read the instructions outlined below and refer to this sheet in the next few weeks. These discharge instructions provide you with general information on caring for yourself after you leave the hospital. Your doctor may also give you specific instructions. While your treatment has been planned according to the most current medical practices available, unavoidable complications occasionally occur. If you have any problems or questions after discharge, please call your doctor. ACTIVITY You may resume your regular activity but move at a slower pace for the next 24 hours.  Take frequent rest periods for the next 24 hours.  Walking will help expel (get rid of) the air and reduce the bloated feeling in your abdomen.  No driving for 24 hours (because of the anesthesia (medicine) used during the test).  You may shower.  Do not sign any important legal documents or operate any machinery for 24 hours (because of the anesthesia used during the test).  NUTRITION Drink plenty of fluids.  You may  resume your normal diet.  Begin with a light meal and progress to your normal diet.  Avoid alcoholic beverages for 24 hours or as instructed by your caregiver.  MEDICATIONS You may resume your normal medications unless your caregiver tells you otherwise.  WHAT YOU CAN EXPECT TODAY You may experience abdominal discomfort such as a feeling of fullness or "gas" pains.  FOLLOW-UP Your doctor will discuss the results of  your test with you.  SEEK IMMEDIATE MEDICAL ATTENTION IF ANY OF THE FOLLOWING OCCUR: Excessive nausea (feeling sick to your stomach) and/or vomiting.  Severe abdominal pain and distention (swelling).  Trouble swallowing.  Temperature over 101 F (37.8 C).  Rectal bleeding or vomiting of blood.    1 polyp removed from your colon today.  You likely have intermittent bleeding from hemorrhoids.  Your esophagus was stretched today.  Some it was inflamed.  Biopsies taken.  You should be able to swallow better.  You may have a transient sore throat in the next 24 to 48 hours.  If so, you can use Chloraseptic spray.  Diverticulosis, hemorrhoid and polyp information provided  Pamphlet on hemorrhoid banding provided  Begin Benefiber 2 tablespoons daily  Further recommendations to follow pending review of pathology report  Office visit with Roseanne Kaufman in 2 months.

## 2020-09-12 ENCOUNTER — Other Ambulatory Visit: Payer: Self-pay | Admitting: Internal Medicine

## 2020-09-12 ENCOUNTER — Encounter: Payer: Self-pay | Admitting: Internal Medicine

## 2020-09-12 LAB — SURGICAL PATHOLOGY

## 2020-09-12 MED ORDER — AMOXICILL-CLARITHRO-LANSOPRAZ PO MISC: Freq: Two times a day (BID) | ORAL | 0 refills | Status: DC

## 2020-09-14 ENCOUNTER — Encounter: Payer: Self-pay | Admitting: Internal Medicine

## 2020-09-16 ENCOUNTER — Encounter (HOSPITAL_COMMUNITY): Payer: Self-pay | Admitting: Internal Medicine

## 2020-09-21 ENCOUNTER — Encounter (HOSPITAL_COMMUNITY): Payer: Self-pay | Admitting: Physical Therapy

## 2020-09-21 ENCOUNTER — Ambulatory Visit (HOSPITAL_COMMUNITY): Payer: Medicare Other | Attending: Family Medicine | Admitting: Physical Therapy

## 2020-09-21 ENCOUNTER — Other Ambulatory Visit: Payer: Self-pay

## 2020-09-21 DIAGNOSIS — R42 Dizziness and giddiness: Secondary | ICD-10-CM | POA: Insufficient documentation

## 2020-09-21 NOTE — Patient Instructions (Signed)
Access Code: [email or web address] URL: [email or web address] Date: 09/21/2020 Prepared by: Josue Hector  Exercises Seated Gaze Stabilization with Head Rotation - 3 x daily - 7 x weekly - 2-3 sets - 10 reps Seated Horizontal Saccades - 3 x daily - 7 x weekly - 2-3 sets - 10 reps

## 2020-09-21 NOTE — Therapy (Signed)
[place], [place], [address] Phone: [phone]   Fax:  [phone]  Physical Therapy Evaluation  Patient Details  Name: Kara Jimenez MRN: [id number] Date of Birth: 1960-03-11 Referring Provider (PT): Roanna Epley Zhou-Talbert   Encounter Date: 09/21/2020   PT End of Session - 09/21/20 1605     Visit Number 1    Number of Visits 8    Date for PT Re-Evaluation 10/19/20    Authorization Type U[employer] Medicare    PT Start Time 1120    PT Stop Time 1200    PT Time Calculation (min) 40 min    Activity Tolerance Patient tolerated treatment well    Behavior During Therapy [facility] for tasks assessed/performed             Past Medical History:  Diagnosis Date   Arthritis    Breast injury    Carpal tunnel syndrome    Complication of anesthesia    pt. states that her HR dropped to 20s when waking up after apppendectomy   Fibromyalgia    GERD (gastroesophageal reflux disease)    Hypertension    Sciatica    Sleep apnea     Past Surgical History:  Procedure Laterality Date   BIOPSY  09/09/2020   Procedure: BIOPSY;  Surgeon: Daneil Dolin, MD;  [facility];  Service: Endoscopy;;   BREAST EXCISIONAL BIOPSY Right    benign   CARPAL TUNNEL RELEASE Bilateral    CESAREAN SECTION     COLONOSCOPY WITH PROPOFOL N/A 09/09/2020   Procedure: COLONOSCOPY WITH PROPOFOL;  Surgeon: Daneil Dolin, MD;  [facility];  Service: Endoscopy;  Laterality: N/A;  12:30pm   ESOPHAGOGASTRODUODENOSCOPY (EGD) WITH PROPOFOL N/A 09/09/2020   Procedure: ESOPHAGOGASTRODUODENOSCOPY (EGD) WITH PROPOFOL;  Surgeon: Daneil Dolin, MD;  [facility];  Service: Endoscopy;  Laterality: N/A;   LAPAROSCOPIC APPENDECTOMY N/A 11/05/2019   Procedure: APPENDECTOMY LAPAROSCOPIC;  Surgeon: Virl Cagey, MD;  Location: [facility];  Service: General;  Laterality: N/A;   MALONEY DILATION N/A 09/09/2020   Procedure: Keturah Shavers;  Surgeon: Daneil Dolin, MD;  [facility];  Service: Endoscopy;  Laterality: N/A;   NECK SURGERY     PARTIAL HYSTERECTOMY     POLYPECTOMY  09/09/2020   Procedure: POLYPECTOMY INTESTINAL;  Surgeon: Daneil Dolin, MD;  [facility];  Service: Endoscopy;;   ROTATOR CUFF REPAIR Left    TUBAL LIGATION      There were no vitals filed for this visit.    Subjective Assessment - 09/21/20 1125     Subjective Patient presents to therapy with complaint of dizziness. She says this has been ongoing for years. She says it has gotten progressively worse and on July 4th weekend she fell. Feels sometimes her knees get weak and give out. She had work up and was diagnosed with vertigo. She reports she is not sure this is what is causing her problems. She has tried medications which made her problem worse.    Limitations Standing;Walking    Patient Stated Goals Get rid of this dizziness    Currently in Pain? --   N/A               [facility] PT Assessment - 09/21/20 0001       Assessment   Medical Diagnosis Vertigo    Referring Provider (PT) Roanna Epley Zhou-Talbert    Prior Therapy Yes for neck  pain      Precautions   Precautions Fall      Balance Screen   Has the patient fallen in the past 6 months Yes    How many times? 1    Has the patient had a decrease in activity level because of a fear of falling?  Yes    Is the patient reluctant to leave their home because of a fear of falling?  Yes      Prior Function   Level of Independence Independent      Observation/Other Assessments   Focus on Therapeutic Outcomes (FOTO)  54% function      ROM / Strength   AROM / PROM / Strength AROM      AROM   AROM Assessment Site Cervical    Cervical Flexion 45    Cervical Extension 28    Cervical - Right Rotation 45    Cervical - Left Rotation 35      Flexibility   Soft Tissue Assessment /Muscle Length --   Mod restriction in upper trap flexibility bilaterally                    Vestibular Assessment - 09/21/20 0001       Symptom Behavior   Type of Dizziness  Spinning;Lightheadedness   "pressure in head" "fluttering"   Frequency of Dizziness Daily    Duration of Dizziness seconds    Symptom Nature Spontaneous    Aggravating Factors --   viariable   Relieving Factors Closing eyes    Progression of Symptoms Worse      Oculomotor Exam   Oculomotor Alignment Normal    Ocular ROM WNL but slight deficit tracking vertiacally in LT field with increased dizziness    Saccades Intact   increased dizziness/ lightheadedness     Vestibulo-Ocular Reflex   VOR 1 Head Only (x 1 viewing) increased dizziness/ lightheadedness      Positional Testing   Sidelying Test Sidelying Right;Sidelying Left      Positional Sensitivities   Sit to Supine No dizziness    Supine to Sitting No dizziness    Head Turning x 5 Lightheadedness    Head Nodding x 5 Lightheadedness                Objective measurements completed on examination: See above findings.            PT Education - 09/21/20 1126     Education Details on evaluation findings, POC and HEP    Person(s) Educated Patient    Methods Explanation;Handout    Comprehension Verbalized understanding              PT Short Term Goals - 09/21/20 1611       PT SHORT TERM GOAL #1   Title Patient will be independent with initial HEP and self-management strategies to improve functional outcomes    Time 2    Period Weeks    Status New    Target Date 10/05/20               PT Long Term Goals - 09/21/20 1611       PT LONG TERM GOAL #1   Title Patient will improve FOTO score to predicted value to indicate improvement in functional outcomes    Time 4    Period Weeks    Status New    Target Date 10/19/20      PT LONG TERM GOAL #2   Title  Patient will report at least 60% overall improvement in subjective complaint to indicate improvement in ability to perform ADLs.     Time 4    Period Weeks    Status New    Target Date 10/19/20      PT LONG TERM GOAL #3   Title Patient will report no falls since start of therapy to demo improved safety awareness and balance    Time 4    Period Weeks    Target Date 10/19/20                    Plan - 09/21/20 1606     Clinical Impression Statement Patient is a 60 y.o. female who presents to physical therapy with complaint of vertigo and dizziness. Patient demonstrates cervical ROM restrictions, positional intolerance and increased dizziness with provocative vestibular testing which are negatively impacting patient ability to perform ADLs and functional mobility tasks. Patient will benefit from skilled physical therapy services to address these deficits to improve level of function with ADLs, and reduce risk for falls.    Personal Factors and Comorbidities Comorbidity 3+    Comorbidities HTN, fibro, arthritis    Examination-Activity Limitations Transfers;Locomotion Level;Stand    Examination-Participation Restrictions Driving;Community Activity;Cleaning;Shop;Laundry    Stability/Clinical Decision Making Stable/Uncomplicated    Clinical Decision Making Low    Rehab Potential Fair    PT Frequency 2x / week    PT Duration 4 weeks    PT Treatment/Interventions ADLs/Self Care Home Management;Aquatic Therapy;Biofeedback;Canalith Repostioning;Ultrasound;Cryotherapy;Electrical Stimulation;Contrast Bath;Fluidtherapy;Therapeutic activities;Patient/family education;Manual techniques;Cognitive remediation;Neuromuscular re-education;Balance training;Scar mobilization;Passive range of motion;Dry needling;DME Instruction;Iontophoresis '4mg'$ /ml Dexamethasone;Gait training;Stair training;Moist Heat;Functional mobility training;Traction;Parrafin;Energy conservation;Splinting;Compression bandaging;Orthotic Fit/Training;Taping;Vasopneumatic Device;Joint Manipulations;Spinal Manipulations;Visual/perceptual  remediation/compensation;Vestibular    PT Next Visit Plan Review goals and HEP. Progress vestibular and habituation activity. Seated head turn and nods> standing, [employer], Saccaded and VOR standing, sit/stands with head turns etc    PT Home Exercise Plan Eval: Saccades, VOR 1    Consulted and Agree with Plan of Care Patient             Patient will benefit from skilled therapeutic intervention in order to improve the following deficits and impairments:  Decreased balance, Impaired flexibility, Decreased range of motion, Dizziness  Visit Diagnosis: Dizziness and giddiness     Problem List Patient Active Problem List   Diagnosis Date Noted   Abdominal pain 05/04/2020   Constipation 05/04/2020   Rectal bleeding 05/04/2020   Dysphagia 05/04/2020   Intraductal papillary mucinous neoplasm of pancreas 12/02/2019   OSA on CPAP 11/16/2019   CPAP use counseling 11/16/2019   Class 2 obesity 11/06/2019   Bradycardia    Acute appendicitis 11/05/2019   Pancreatic lesion 11/05/2019   4:15 PM, 09/21/20 Josue Hector PT DPT  Physical Therapist with [facility]  [phone] [place] [address] [place], [place], [address] Phone: [phone]   Fax:  [phone]  Name: Kara Jimenez MRN: [id number] Date of Birth: October 08, 1960

## 2020-09-29 ENCOUNTER — Encounter (HOSPITAL_COMMUNITY): Payer: Self-pay | Admitting: Physical Therapy

## 2020-09-29 ENCOUNTER — Ambulatory Visit (HOSPITAL_COMMUNITY): Payer: Medicare Other | Admitting: Physical Therapy

## 2020-09-29 ENCOUNTER — Other Ambulatory Visit: Payer: Self-pay

## 2020-09-29 DIAGNOSIS — R42 Dizziness and giddiness: Secondary | ICD-10-CM | POA: Diagnosis not present

## 2020-09-29 NOTE — Patient Instructions (Signed)
Access Code: [email or web address] URL: [email or web address] Date: 09/29/2020 Prepared by: Josue Hector  Exercises Seated Head Nods Vestibular Habituation - 2-3 x daily - 7 x weekly - 3 sets - 10 reps Seated Right Head Turns Vestibular Habituation - 2-3 x daily - 7 x weekly - 3 sets - 10 reps

## 2020-09-29 NOTE — Therapy (Signed)
[place], [place], [address] Phone: [phone]   Fax:  [phone]  Physical Therapy Treatment  Patient Details  Name: Kara Jimenez MRN: [id number] Date of Birth: 1960-05-10 Referring Provider (PT): Roanna Epley Zhou-Talbert   Encounter Date: 09/29/2020   PT End of Session - 09/29/20 0950     Visit Number 2    Number of Visits 8    Date for PT Re-Evaluation 10/19/20    Authorization Type UHC Medicare    PT Start Time [phone]    PT Stop Time 1026    PT Time Calculation (min) 38 min    Activity Tolerance Patient tolerated treatment well    Behavior During Therapy [facility] for tasks assessed/performed             Past Medical History:  Diagnosis Date   Arthritis    Breast injury    Carpal tunnel syndrome    Complication of anesthesia    pt. states that her HR dropped to 20s when waking up after apppendectomy   Fibromyalgia    GERD (gastroesophageal reflux disease)    Hypertension    Sciatica    Sleep apnea     Past Surgical History:  Procedure Laterality Date   BIOPSY  09/09/2020   Procedure: BIOPSY;  Surgeon: Daneil Dolin, MD;  [facility];  Service: Endoscopy;;   BREAST EXCISIONAL BIOPSY Right    benign   CARPAL TUNNEL RELEASE Bilateral    CESAREAN SECTION     COLONOSCOPY WITH PROPOFOL N/A 09/09/2020   Procedure: COLONOSCOPY WITH PROPOFOL;  Surgeon: Daneil Dolin, MD;  [facility];  Service: Endoscopy;  Laterality: N/A;  12:30pm   ESOPHAGOGASTRODUODENOSCOPY (EGD) WITH PROPOFOL N/A 09/09/2020   Procedure: ESOPHAGOGASTRODUODENOSCOPY (EGD) WITH PROPOFOL;  Surgeon: Daneil Dolin, MD;  [facility];  Service: Endoscopy;  Laterality: N/A;   LAPAROSCOPIC APPENDECTOMY N/A 11/05/2019   Procedure: APPENDECTOMY LAPAROSCOPIC;  Surgeon: Virl Cagey, MD;  [facility];  Service: General;  Laterality: N/A;   MALONEY DILATION N/A 09/09/2020   Procedure: Keturah Shavers;  Surgeon: Daneil Dolin, MD;  [facility];  Service: Endoscopy;  Laterality: N/A;   NECK SURGERY     PARTIAL HYSTERECTOMY     POLYPECTOMY  09/09/2020   Procedure: POLYPECTOMY INTESTINAL;  Surgeon: Daneil Dolin, MD;  [facility];  Service: Endoscopy;;   ROTATOR CUFF REPAIR Left    TUBAL LIGATION      There were no vitals filed for this visit.   Subjective Assessment - 09/29/20 0950     Subjective "Still dizzy". Reports compliance with HEP as instructed    Limitations Standing;Walking    Patient Stated Goals Get rid of this dizziness                                Vestibular Treatment/Exercise - 09/29/20 0001       Vestibular Treatment/Exercise   Habituation Exercises Seated Horizontal Head Turns;Seated Vertical Head Turns    Gaze Exercises Eye/Head Exercise Horizontal;Eye/Head Exercise Vertical      Seated Horizontal Head Turns   Number of Reps  30      Seated Vertical Head Turns   Number of Reps  30      Eye/Head Exercise Horizontal   Comments Saccades 3 x 10, VOR 1 3 x 10  Eye/Head Exercise Vertical   Comments Saccades 3 x 10                      PT Short Term Goals - 09/21/20 1611       PT SHORT TERM GOAL #1   Title Patient will be independent with initial HEP and self-management strategies to improve functional outcomes    Time 2    Period Weeks    Status New    Target Date 10/05/20               PT Long Term Goals - 09/21/20 1611       PT LONG TERM GOAL #1   Title Patient will improve FOTO score to predicted value to indicate improvement in functional outcomes    Time 4    Period Weeks    Status New    Target Date 10/19/20      PT LONG TERM GOAL #2   Title Patient will report at least 60% overall improvement in subjective complaint to indicate improvement in ability to perform ADLs.    Time 4    Period Weeks    Status New    Target Date 10/19/20      PT LONG TERM  GOAL #3   Title Patient will report no falls since start of therapy to demo improved safety awareness and balance    Time 4    Period Weeks    Target Date 10/19/20                   Plan - 09/29/20 1023     Clinical Impression Statement Reviewed goals and HEP. Improved tolerance with HEP exercise. Still with mild dizziness and lightheadedness reported, but improves with further sets. Progressed habituation activity. Patient noting most increase in dizziness with vertical head nods. Notes increased "swelling" in neck. Educated patient on pacing of vestibular activity. Issued updated HEP. Patient will continue to benefit from skilled therapy services to reduce deficits and improve functional ability.    Personal Factors and Comorbidities Comorbidity 3+    Comorbidities HTN, fibro, arthritis    Examination-Activity Limitations Transfers;Locomotion Level;Stand    Examination-Participation Restrictions Driving;Community Activity;Cleaning;Shop;Laundry    Stability/Clinical Decision Making Stable/Uncomplicated    Rehab Potential Fair    PT Frequency 2x / week    PT Duration 4 weeks    PT Treatment/Interventions ADLs/Self Care Home Management;Aquatic Therapy;Biofeedback;Canalith Repostioning;Ultrasound;Cryotherapy;Electrical Stimulation;Contrast Bath;Fluidtherapy;Therapeutic activities;Patient/family education;Manual techniques;Cognitive remediation;Neuromuscular re-education;Balance training;Scar mobilization;Passive range of motion;Dry needling;DME Instruction;Iontophoresis '4mg'$ /ml Dexamethasone;Gait training;Stair training;Moist Heat;Functional mobility training;Traction;Parrafin;Energy conservation;Splinting;Compression bandaging;Orthotic Fit/Training;Taping;Vasopneumatic Device;Joint Manipulations;Spinal Manipulations;Visual/perceptual remediation/compensation;Vestibular    PT Next Visit Plan Progress vestibular and habituation activity. Standing head turns/ nods, Nestor Lewandowsky, Saccaded and  VOR standing, sit/stands with head turns etc    PT Home Exercise Plan Eval: Saccades, VOR 1 9/15 head nods, head turns    Consulted and Agree with Plan of Care Patient             Patient will benefit from skilled therapeutic intervention in order to improve the following deficits and impairments:  Decreased balance, Impaired flexibility, Decreased range of motion, Dizziness  Visit Diagnosis: Dizziness and giddiness     Problem List Patient Active Problem List   Diagnosis Date Noted   Abdominal pain 05/04/2020   Constipation 05/04/2020   Rectal bleeding 05/04/2020   Dysphagia 05/04/2020   Intraductal papillary mucinous neoplasm of pancreas 12/02/2019   OSA on CPAP 11/16/2019   CPAP use counseling 11/16/2019  Class 2 obesity 11/06/2019   Bradycardia    Acute appendicitis 11/05/2019   Pancreatic lesion 11/05/2019   10:28 AM, 09/29/20 Josue Hector PT DPT  Physical Therapist with [facility]  [phone] [place] [address] [place], [place], [address] Phone: [phone]   Fax:  [phone]  Name: Kara Jimenez MRN: [id number] Date of Birth: 1960/12/09

## 2020-10-05 ENCOUNTER — Other Ambulatory Visit: Payer: Self-pay

## 2020-10-05 ENCOUNTER — Ambulatory Visit (HOSPITAL_COMMUNITY): Payer: Medicare Other | Admitting: Physical Therapy

## 2020-10-05 ENCOUNTER — Encounter (HOSPITAL_COMMUNITY): Payer: Self-pay | Admitting: Physical Therapy

## 2020-10-05 DIAGNOSIS — R42 Dizziness and giddiness: Secondary | ICD-10-CM

## 2020-10-05 NOTE — Therapy (Signed)
[place], [place], [address] Phone: [phone]   Fax:  [phone]  Physical Therapy Treatment  Patient Details  Name: Kara Jimenez MRN: [id number] Date of Birth: [birth year] Referring Provider (PT): Roanna Epley Zhou-Talbert   Encounter Date: 10/05/2020   PT End of Session - 10/05/20 0952     Visit Number 3    Number of Visits 8    Date for PT Re-Evaluation 10/19/20    Authorization Type UHC Medicare    PT Start Time [phone]    PT Stop Time 1026    PT Time Calculation (min) 38 min    Activity Tolerance Patient tolerated treatment well    Behavior During Therapy [facility] for tasks assessed/performed             Past Medical History:  Diagnosis Date   Arthritis    Breast injury    Carpal tunnel syndrome    Complication of anesthesia    pt. states that her HR dropped to 20s when waking up after apppendectomy   Fibromyalgia    GERD (gastroesophageal reflux disease)    Hypertension    Sciatica    Sleep apnea     Past Surgical History:  Procedure Laterality Date   BIOPSY  09/09/2020   Procedure: BIOPSY;  Surgeon: Daneil Dolin, MD;  [facility];  Service: Endoscopy;;   BREAST EXCISIONAL BIOPSY Right    benign   CARPAL TUNNEL RELEASE Bilateral    CESAREAN SECTION     COLONOSCOPY WITH PROPOFOL N/A 09/09/2020   Procedure: COLONOSCOPY WITH PROPOFOL;  Surgeon: Daneil Dolin, MD;  [facility];  Service: Endoscopy;  Laterality: N/A;  12:30pm   ESOPHAGOGASTRODUODENOSCOPY (EGD) WITH PROPOFOL N/A 09/09/2020   Procedure: ESOPHAGOGASTRODUODENOSCOPY (EGD) WITH PROPOFOL;  Surgeon: Daneil Dolin, MD;  [facility];  Service: Endoscopy;  Laterality: N/A;   LAPAROSCOPIC APPENDECTOMY N/A 11/05/2019   Procedure: APPENDECTOMY LAPAROSCOPIC;  Surgeon: Virl Cagey, MD;  [facility]: [facility];  Service: General;  Laterality: N/A;   MALONEY DILATION N/A 09/09/2020   Procedure: Keturah Shavers;  Surgeon: Daneil Dolin, MD;  [facility];  Service: Endoscopy;  Laterality: N/A;   NECK SURGERY     PARTIAL HYSTERECTOMY     POLYPECTOMY  09/09/2020   Procedure: POLYPECTOMY INTESTINAL;  Surgeon: Daneil Dolin, MD;  [facility];  Service: Endoscopy;;   ROTATOR CUFF REPAIR Left    TUBAL LIGATION      There were no vitals filed for this visit.   Subjective Assessment - 10/05/20 0950     Subjective Reports ongoing dizziness throughout the day. Says she feels her heart rate increases when she feels dizzy. Has been doing HEP but has not yet noticed any improvement.    Limitations Standing;Walking    Patient Stated Goals Get rid of this dizziness    Currently in Pain? Yes    Pain Score 6     Pain Location --   neck down to RT arm, knees, back                              [facility] Adult PT Treatment/Exercise - 10/05/20 0001       Manual Therapy   Manual Therapy Soft tissue mobilization    Manual therapy comments Completed separate form all other activity    Soft tissue mobilization IASTM to RT  upper trap and cervical paraspinals, patient seated             Vestibular Treatment/Exercise - 10/05/20 0001       Vestibular Treatment/Exercise   Vestibular Treatment Provided --   Horiz saccades 3 x 10, VOR x 1 (3 x 10), seated head turns and nods 3 x 10 each, Brandt Daroff x 5 each, standing head turns and nods 3 x 10 each                     PT Short Term Goals - 09/21/20 1611       PT SHORT TERM GOAL #1   Title Patient will be independent with initial HEP and self-management strategies to improve functional outcomes    Time 2    Period Weeks    Status New    Target Date 10/05/20               PT Long Term Goals - 09/21/20 1611       PT LONG TERM GOAL #1   Title Patient will improve FOTO score to predicted value to indicate improvement in functional outcomes    Time 4    Period Weeks    Status New     Target Date 10/19/20      PT LONG TERM GOAL #2   Title Patient will report at least 60% overall improvement in subjective complaint to indicate improvement in ability to perform ADLs.    Time 4    Period Weeks    Status New    Target Date 10/19/20      PT LONG TERM GOAL #3   Title Patient will report no falls since start of therapy to demo improved safety awareness and balance    Time 4    Period Weeks    Target Date 10/19/20                   Plan - 10/05/20 1519     Clinical Impression Statement Progressed to standing habituation activity. Patient continues to demo varying symptoms with most all activities performed, including lightheaded, dizzy, "pressure in head", and tensions in neck. Patient seems to be most challenged with vertical head movements today. Notes more lightheadedness with Nestor Lewandowsky to LT. Visual tracking remains good despite increased symptoms. No nystagmus noted with any positional changes. Trialed manual STM to address neck issues and improve possible cervicogenic contributions to dizziness. Will assess response next session.    Personal Factors and Comorbidities Comorbidity 3+    Comorbidities HTN, fibro, arthritis    Examination-Activity Limitations Transfers;Locomotion Level;Stand    Examination-Participation Restrictions Driving;Community Activity;Cleaning;Shop;Laundry    Stability/Clinical Decision Making Stable/Uncomplicated    Rehab Potential Fair    PT Frequency 2x / week    PT Duration 4 weeks    PT Treatment/Interventions ADLs/Self Care Home Management;Aquatic Therapy;Biofeedback;Canalith Repostioning;Ultrasound;Cryotherapy;Electrical Stimulation;Contrast Bath;Fluidtherapy;Therapeutic activities;Patient/family education;Manual techniques;Cognitive remediation;Neuromuscular re-education;Balance training;Scar mobilization;Passive range of motion;Dry needling;DME Instruction;Iontophoresis 4mg /ml Dexamethasone;Gait training;Stair training;Moist  Heat;Functional mobility training;Traction;Parrafin;Energy conservation;Splinting;Compression bandaging;Orthotic Fit/Training;Taping;Vasopneumatic Device;Joint Manipulations;Spinal Manipulations;Visual/perceptual remediation/compensation;Vestibular    PT Next Visit Plan Progress vestibular and habituation activity. Saccades and VOR standing, sit/stands with head turns etc    PT Home Exercise Plan Eval: Saccades, VOR 1 9/15 head nods, head turns 9/21 Nestor Lewandowsky    Consulted and Agree with Plan of Care Patient             Patient will benefit from skilled therapeutic intervention in order to improve the following  deficits and impairments:  Decreased balance, Impaired flexibility, Decreased range of motion, Dizziness  Visit Diagnosis: Dizziness and giddiness     Problem List Patient Active Problem List   Diagnosis Date Noted   Abdominal pain 05/04/2020   Constipation 05/04/2020   Rectal bleeding 05/04/2020   Dysphagia 05/04/2020   Intraductal papillary mucinous neoplasm of pancreas 12/02/2019   OSA on CPAP 11/16/2019   CPAP use counseling 11/16/2019   Class 2 obesity 11/06/2019   Bradycardia    Acute appendicitis 11/05/2019   Pancreatic lesion 11/05/2019   3:22 PM, 10/05/20 Josue Hector PT DPT  Physical Therapist with [facility]  [phone] [place] [address] [place], [place], [address] Phone: [phone]   Fax:  [phone]  Name: Kara Jimenez MRN: [id number] Date of Birth: May 29, 1960

## 2020-10-05 NOTE — Patient Instructions (Signed)
Access Code: [email or web address] URL: [email or web address] Date: 10/05/2020 Prepared by: Josue Hector  Exercises Brandt-Daroff Vestibular Exercise - 2 x daily - 7 x weekly - 1 sets - 10 reps

## 2020-10-10 ENCOUNTER — Other Ambulatory Visit (HOSPITAL_COMMUNITY): Payer: Self-pay | Admitting: Sports Medicine

## 2020-10-10 DIAGNOSIS — G8929 Other chronic pain: Secondary | ICD-10-CM

## 2020-10-10 DIAGNOSIS — M7541 Impingement syndrome of right shoulder: Secondary | ICD-10-CM

## 2020-10-10 DIAGNOSIS — M7551 Bursitis of right shoulder: Secondary | ICD-10-CM

## 2020-10-12 ENCOUNTER — Ambulatory Visit (HOSPITAL_COMMUNITY): Payer: Medicare Other | Admitting: Physical Therapy

## 2020-10-12 ENCOUNTER — Other Ambulatory Visit: Payer: Self-pay

## 2020-10-12 DIAGNOSIS — R42 Dizziness and giddiness: Secondary | ICD-10-CM

## 2020-10-12 NOTE — Therapy (Signed)
[place], [place], [address] Phone: [phone]   Fax:  [phone]  Physical Therapy Treatment  Patient Details  Name: Kara Jimenez MRN: [id number] Date of Birth: March 23, 1960 Referring Provider (PT): Roanna Epley Zhou-Talbert   Encounter Date: 10/12/2020   PT End of Session - 10/12/20 1229     Visit Number 4    Number of Visits 8    Date for PT Re-Evaluation 10/19/20    Authorization Type [employer] Medicare    PT Start Time 1138    PT Stop Time 1218    PT Time Calculation (min) 40 min             Past Medical History:  Diagnosis Date   Arthritis    Breast injury    Carpal tunnel syndrome    Complication of anesthesia    pt. states that her HR dropped to 20s when waking up after apppendectomy   Fibromyalgia    GERD (gastroesophageal reflux disease)    Hypertension    Sciatica    Sleep apnea     Past Surgical History:  Procedure Laterality Date   BIOPSY  09/09/2020   Procedure: BIOPSY;  Surgeon: Daneil Dolin, MD;  [facility];  Service: Endoscopy;;   BREAST EXCISIONAL BIOPSY Right    benign   CARPAL TUNNEL RELEASE Bilateral    CESAREAN SECTION     COLONOSCOPY WITH PROPOFOL N/A 09/09/2020   Procedure: COLONOSCOPY WITH PROPOFOL;  Surgeon: Daneil Dolin, MD;  [facility];  Service: Endoscopy;  Laterality: N/A;  12:30pm   ESOPHAGOGASTRODUODENOSCOPY (EGD) WITH PROPOFOL N/A 09/09/2020   Procedure: ESOPHAGOGASTRODUODENOSCOPY (EGD) WITH PROPOFOL;  Surgeon: Daneil Dolin, MD;  [facility];  Service: Endoscopy;  Laterality: N/A;   LAPAROSCOPIC APPENDECTOMY N/A 11/05/2019   Procedure: APPENDECTOMY LAPAROSCOPIC;  Surgeon: Virl Cagey, MD;  Location: [facility];  Service: General;  Laterality: N/A;   MALONEY DILATION N/A 09/09/2020   Procedure: Keturah Shavers;  Surgeon: Daneil Dolin, MD;  [facility];  Service: Endoscopy;  Laterality: N/A;   NECK SURGERY      PARTIAL HYSTERECTOMY     POLYPECTOMY  09/09/2020   Procedure: POLYPECTOMY INTESTINAL;  Surgeon: Daneil Dolin, MD;  [facility];  Service: Endoscopy;;   ROTATOR CUFF REPAIR Left    TUBAL LIGATION      There were no vitals filed for this visit.   Subjective Assessment - 10/12/20 1139     Subjective PT states that there is no rhyme or reason to her dizziness it just comes on and will last about a minute.  The exercise she was given makes her dizziness    Limitations Standing;Walking    Patient Stated Goals Get rid of this dizziness    Pain Score 6    dizziness scale not pain                    Vestibular Assessment - 10/12/20 0001       Positional Testing   Dix-Hallpike Dix-Hallpike Right;Dix-Hallpike Left      Dix-Hallpike Right   Dix-Hallpike Right Symptoms No nystagmus      Dix-Hallpike Left   Dix-Hallpike Left Symptoms No nystagmus      Positional Sensitivities   Up from Right Hallpike Lightheadedness    Up from Left Hallpike No dizziness    Positional Sensitivities Comments sit to stand slight increased sx  [facility] Adult PT Treatment/Exercise - 10/12/20 0001       Exercises   Exercises Neck      Neck Exercises: Seated   Cervical Rotation 5 reps;Both    Lateral Flexion 5 reps;Both      Manual Therapy   Manual Therapy Soft tissue mobilization    Manual therapy comments Completed separate form all other activity    Soft tissue mobilization improve ROM and decrease tension             Vestibular Treatment/Exercise - 10/12/20 0001       Vestibular Treatment/Exercise   Vestibular Treatment Provided Habituation    Habituation Exercises Brandt Daroff;Comment;180 degree Turns    Gaze Exercises X1 Viewing Horizontal;X1 Viewing Vertical;Eye/Head Exercise Horizontal;Eye/Head Exercise Vertical      Nestor Lewandowsky   Number of Reps  5      180 degree Turns   Number of Reps  --   90 degree turn x 5 2 reps;  B     Eye/Head Exercise Horizontal   Comments 5 x saccades and 5 gaze x 2 reps each      Eye/Head Exercise Vertical   Comments 5 x 2 reps of both saccades and eye gaze exercises with increased dizziness                      PT Short Term Goals - 09/21/20 1611       PT SHORT TERM GOAL #1   Title Patient will be independent with initial HEP and self-management strategies to improve functional outcomes    Time 2    Period Weeks    Status New    Target Date 10/05/20               PT Long Term Goals - 09/21/20 1611       PT LONG TERM GOAL #1   Title Patient will improve FOTO score to predicted value to indicate improvement in functional outcomes    Time 4    Period Weeks    Status New    Target Date 10/19/20      PT LONG TERM GOAL #2   Title Patient will report at least 60% overall improvement in subjective complaint to indicate improvement in ability to perform ADLs.    Time 4    Period Weeks    Status New    Target Date 10/19/20      PT LONG TERM GOAL #3   Title Patient will report no falls since start of therapy to demo improved safety awareness and balance    Time 4    Period Weeks    Target Date 10/19/20                   Plan - 10/12/20 1229     Clinical Impression Statement Pt with no improvement of sx.  Pt (-) with Dix-halpike manuever.  More positive results with head nods than turns,  Added 90 degree turns with mild dizziness B.    Personal Factors and Comorbidities Comorbidity 3+    Comorbidities HTN, fibro, arthritis    Examination-Activity Limitations Transfers;Locomotion Level;Stand    Examination-Participation Restrictions Driving;Community Activity;Cleaning;Shop;Laundry    Stability/Clinical Decision Making Stable/Uncomplicated    Rehab Potential Fair    PT Frequency 2x / week    PT Duration 4 weeks    PT Treatment/Interventions ADLs/Self Care Home Management;Aquatic Therapy;Biofeedback;Canalith  Repostioning;Ultrasound;Cryotherapy;Electrical Stimulation;Contrast Bath;Fluidtherapy;Therapeutic activities;Patient/family education;Manual techniques;Cognitive remediation;Neuromuscular re-education;Balance training;Scar mobilization;Passive range of  motion;Dry needling;DME Instruction;Iontophoresis 4mg /ml Dexamethasone;Gait training;Stair training;Moist Heat;Functional mobility training;Traction;Parrafin;Energy conservation;Splinting;Compression bandaging;Orthotic Fit/Training;Taping;Vasopneumatic Device;Joint Manipulations;Spinal Manipulations;Visual/perceptual remediation/compensation;Vestibular    PT Next Visit Plan Progress vestibular and habituation activity. Saccades and VOR standing, sit/stands with head turns etc    PT Home Exercise Plan Eval: Saccades, VOR 1 9/15 head nods, head turns 9/21 Nestor Lewandowsky; 9/28:  standing 90 degree turns B    Consulted and Agree with Plan of Care Patient             Patient will benefit from skilled therapeutic intervention in order to improve the following deficits and impairments:  Decreased balance, Impaired flexibility, Decreased range of motion, Dizziness  Visit Diagnosis: Dizziness and giddiness     Problem List Patient Active Problem List   Diagnosis Date Noted   Abdominal pain 05/04/2020   Constipation 05/04/2020   Rectal bleeding 05/04/2020   Dysphagia 05/04/2020   Intraductal papillary mucinous neoplasm of pancreas 12/02/2019   OSA on CPAP 11/16/2019   CPAP use counseling 11/16/2019   Class 2 obesity 11/06/2019   Bradycardia    Acute appendicitis 11/05/2019   Pancreatic lesion 11/05/2019  Rayetta Humphrey, PT CLT [phone]  10/12/2020, 12:32 PM  [facility] [address] [place], [place], [address] Phone: [phone]   Fax:  [phone]  Name: Kara Jimenez MRN: [id number] Date of Birth: 06-28-1960

## 2020-10-17 ENCOUNTER — Ambulatory Visit (HOSPITAL_COMMUNITY): Payer: Medicare Other | Attending: Family Medicine | Admitting: Physical Therapy

## 2020-10-17 ENCOUNTER — Other Ambulatory Visit: Payer: Self-pay

## 2020-10-17 ENCOUNTER — Encounter (HOSPITAL_COMMUNITY): Payer: Self-pay | Admitting: Physical Therapy

## 2020-10-17 DIAGNOSIS — R42 Dizziness and giddiness: Secondary | ICD-10-CM | POA: Diagnosis not present

## 2020-10-17 NOTE — Therapy (Signed)
[place], [place], [address] Phone: [phone]   Fax:  [phone]  Physical Therapy Treatment  Patient Details  Name: Kara Jimenez MRN: [id number] Date of Birth: Jan 10, 1961 Referring Provider (PT): Roanna Epley Zhou-Talbert  PHYSICAL THERAPY DISCHARGE SUMMARY  Visits from Start of Care: 5  Current functional level related to goals / functional outcomes: See below   Remaining deficits: See below   Education / Equipment: See assessment    Patient agrees to discharge. Patient goals were not met. Patient is being discharged due to lack of progress.  Encounter Date: 10/17/2020   PT End of Session - 10/17/20 1035     Visit Number 5    Number of Visits 8    Date for PT Re-Evaluation 10/19/20    Authorization Type [employer] Medicare    PT Start Time 0950    PT Stop Time 1015    PT Time Calculation (min) 25 min    Activity Tolerance Patient tolerated treatment well    Behavior During Therapy WFL for tasks assessed/performed             Past Medical History:  Diagnosis Date   Arthritis    Breast injury    Carpal tunnel syndrome    Complication of anesthesia    pt. states that her HR dropped to 20s when waking up after apppendectomy   Fibromyalgia    GERD (gastroesophageal reflux disease)    Hypertension    Sciatica    Sleep apnea     Past Surgical History:  Procedure Laterality Date   BIOPSY  09/09/2020   Procedure: BIOPSY;  Surgeon: Daneil Dolin, MD;  [facility];  Service: Endoscopy;;   BREAST EXCISIONAL BIOPSY Right    benign   CARPAL TUNNEL RELEASE Bilateral    CESAREAN SECTION     COLONOSCOPY WITH PROPOFOL N/A 09/09/2020   Procedure: COLONOSCOPY WITH PROPOFOL;  Surgeon: Daneil Dolin, MD;  [facility];  Service: Endoscopy;  Laterality: N/A;  12:30pm   ESOPHAGOGASTRODUODENOSCOPY (EGD) WITH PROPOFOL N/A 09/09/2020   Procedure: ESOPHAGOGASTRODUODENOSCOPY (EGD) WITH  PROPOFOL;  Surgeon: Daneil Dolin, MD;  [facility];  Service: Endoscopy;  Laterality: N/A;   LAPAROSCOPIC APPENDECTOMY N/A 11/05/2019   Procedure: APPENDECTOMY LAPAROSCOPIC;  Surgeon: Virl Cagey, MD;  [facility]: [facility];  Service: General;  Laterality: N/A;   MALONEY DILATION N/A 09/09/2020   Procedure: Keturah Shavers;  Surgeon: Daneil Dolin, MD;  [facility];  Service: Endoscopy;  Laterality: N/A;   NECK SURGERY     PARTIAL HYSTERECTOMY     POLYPECTOMY  09/09/2020   Procedure: POLYPECTOMY INTESTINAL;  Surgeon: Daneil Dolin, MD;  [facility];  Service: Endoscopy;;   ROTATOR CUFF REPAIR Left    TUBAL LIGATION      There were no vitals filed for this visit.   Subjective Assessment - 10/17/20 1044     Subjective Patient reports no improvement from last session. Still having intermittent dizziness and lightheadedness that varies throughout the day. Reports virtually no improvement in dizziness since starting therapy. Denies any recent falls.    Limitations Standing;Walking    Patient Stated Goals Get rid of this dizziness                [facility] PT Assessment - 10/17/20 0001       Assessment   Medical Diagnosis Vertigo    Referring Provider (PT) Roanna Epley Zhou-Talbert  Prior Therapy Yes for neck pain      Balance Screen   Has the patient fallen in the past 6 months No   none since starting therapy     Prior Function   Level of Independence Independent      Cognition   Overall Cognitive Status Within Functional Limits for tasks assessed      Observation/Other Assessments   Focus on Therapeutic Outcomes (FOTO)  50% function   was 54%     AROM   Cervical Flexion 47    Cervical Extension 33    Cervical - Right Rotation 39    Cervical - Left Rotation 38                            Vestibular Treatment/Exercise - 10/17/20 0001       Vestibular Treatment/Exercise   Habituation Exercises --   seated  saccades horiz x 10, VOR x 10, seated head nods/ turns x 5 each                     PT Short Term Goals - 10/17/20 1036       PT SHORT TERM GOAL #1   Title Patient will be independent with initial HEP and self-management strategies to improve functional outcomes    Baseline Reports compliance and demos good return    Time 2    Period Weeks    Status Achieved    Target Date 10/05/20               PT Long Term Goals - 10/17/20 1036       PT LONG TERM GOAL #1   Title Patient will improve FOTO score to predicted value to indicate improvement in functional outcomes    Baseline 4% decline    Time 4    Period Weeks    Status Not Met      PT LONG TERM GOAL #2   Title Patient will report at least 60% overall improvement in subjective complaint to indicate improvement in ability to perform ADLs.    Baseline Reports no improvement    Time 4    Period Weeks    Status Not Met      PT LONG TERM GOAL #3   Title Patient will report no falls since start of therapy to demo improved safety awareness and balance    Time 4    Period Weeks    Status Achieved                   Plan - 10/17/20 1037     Clinical Impression Statement Patient has made minimal progress toward therapy goals. Patient does show some improvement with cervical AROM, however remains significantly limited by reports on dizziness/ lightheadedness throughout the day. This has not shown any sign of improvement per patient subjective report. At this time patient will be DC to return to referring provider for further assessment. Reviewed HEP and answered all patient questions. Patient encouraged to follow up with therapy services with any further questions or concerns.    Personal Factors and Comorbidities Comorbidity 3+    Comorbidities HTN, fibro, arthritis    Examination-Activity Limitations Transfers;Locomotion Level;Stand    Examination-Participation Restrictions Driving;Community  Activity;Cleaning;Shop;Laundry    Stability/Clinical Decision Making Stable/Uncomplicated    Rehab Potential Fair    PT Frequency 2x / week    PT Duration 4 weeks    PT  Treatment/Interventions ADLs/Self Care Home Management;Aquatic Therapy;Biofeedback;Canalith Repostioning;Ultrasound;Cryotherapy;Electrical Stimulation;Contrast Bath;Fluidtherapy;Therapeutic activities;Patient/family education;Manual techniques;Cognitive remediation;Neuromuscular re-education;Balance training;Scar mobilization;Passive range of motion;Dry needling;DME Instruction;Iontophoresis 45m/ml Dexamethasone;Gait training;Stair training;Moist Heat;Functional mobility training;Traction;Parrafin;Energy conservation;Splinting;Compression bandaging;Orthotic Fit/Training;Taping;Vasopneumatic Device;Joint Manipulations;Spinal Manipulations;Visual/perceptual remediation/compensation;Vestibular    PT Next Visit Plan Dc to HEP    PT Home Exercise Plan Eval: Saccades, VOR 1 9/15 head nods, head turns 9/21 BNestor Lewandowsky 9/28:  standing 90 degree turns B    Consulted and Agree with Plan of Care Patient             Patient will benefit from skilled therapeutic intervention in order to improve the following deficits and impairments:  Decreased balance, Impaired flexibility, Decreased range of motion, Dizziness  Visit Diagnosis: Dizziness and giddiness     Problem List Patient Active Problem List   Diagnosis Date Noted   Abdominal pain 05/04/2020   Constipation 05/04/2020   Rectal bleeding 05/04/2020   Dysphagia 05/04/2020   Intraductal papillary mucinous neoplasm of pancreas 12/02/2019   OSA on CPAP 11/16/2019   CPAP use counseling 11/16/2019   Class 2 obesity 11/06/2019   Bradycardia    Acute appendicitis 11/05/2019   Pancreatic lesion 11/05/2019   10:47 AM, 10/17/20 CJosue HectorPT DPT  [facility] [phone] [address] [address]Phone: [phone]  Fax:  [phone] Name: Kara CUSHMANMRN: [id number] of Birth: [birth year]

## 2020-10-18 ENCOUNTER — Encounter (HOSPITAL_COMMUNITY): Payer: Medicare Other | Admitting: Physical Therapy

## 2020-10-31 ENCOUNTER — Ambulatory Visit (HOSPITAL_COMMUNITY): Payer: Medicare Other

## 2020-10-31 ENCOUNTER — Encounter (HOSPITAL_COMMUNITY): Payer: Self-pay

## 2020-11-03 ENCOUNTER — Other Ambulatory Visit: Payer: Self-pay

## 2020-11-03 ENCOUNTER — Ambulatory Visit (HOSPITAL_COMMUNITY)
Admission: RE | Admit: 2020-11-03 | Discharge: 2020-11-03 | Disposition: A | Discharge status: Home / Self Care | Payer: Medicare Other | Source: Ambulatory Visit | Attending: Sports Medicine | Admitting: Sports Medicine

## 2020-11-03 DIAGNOSIS — M25511 Pain in right shoulder: Secondary | ICD-10-CM | POA: Insufficient documentation

## 2020-11-03 DIAGNOSIS — M7551 Bursitis of right shoulder: Secondary | ICD-10-CM | POA: Insufficient documentation

## 2020-11-03 DIAGNOSIS — G8929 Other chronic pain: Secondary | ICD-10-CM | POA: Diagnosis present

## 2020-11-03 DIAGNOSIS — M7541 Impingement syndrome of right shoulder: Secondary | ICD-10-CM | POA: Diagnosis present

## 2020-11-03 IMAGING — MR MR SHOULDER*R* W/O CM
5 series · 40 of 40 positions shown · non-contrast
Comparison: None.

CLINICAL DATA: Right shoulder pain x over 5 years. No known injury.
Patient reports history of surgery in [Q8].

EXAM:
MRI OF THE RIGHT SHOULDER WITHOUT CONTRAST
TECHNIQUE: Multiplanar, multisequence MR imaging of the shoulder was performed.
No intravenous contrast was administered.

[Series 6: T2 fat-sat · oblique · right · 4.0mm · 0.47mm/px · 8 of 26 slices shown (1 of 2)]
[im 1/26]
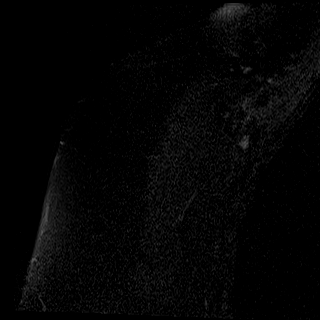
[im 4/26]
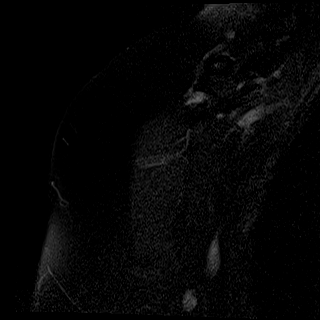
[im 8/26]
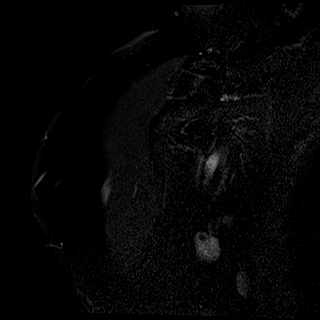
[im 11/26]
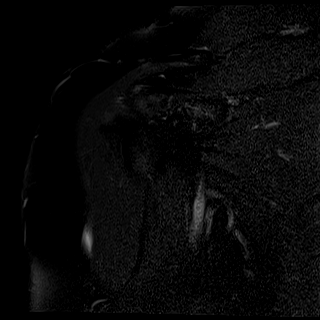
[im 15/26]
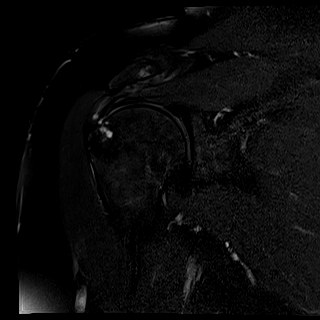
[im 18/26]
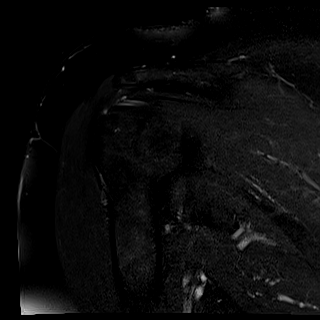
[im 22/26]
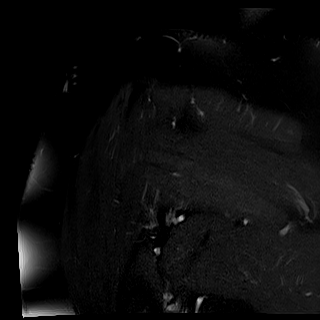
[im 26/26]
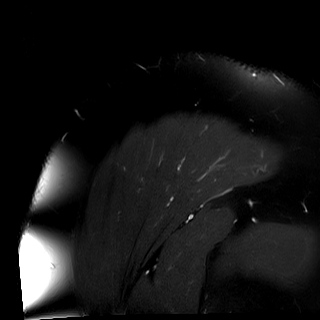

[Series 7: T2 fat-sat · axial · right · 4.0mm · 0.47mm/px · z∈[-68,+48]mm · 8 of 26 slices shown (2 of 2)]
[im 1/26]
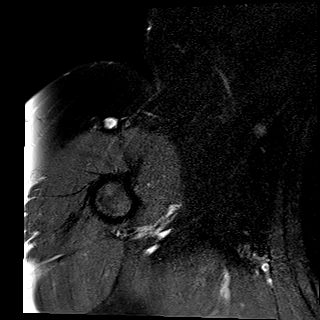
[im 4/26]
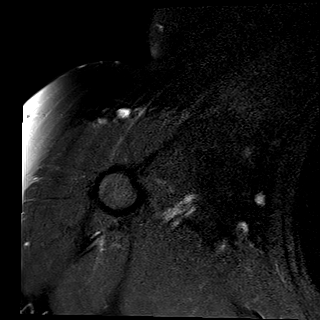
[im 8/26]
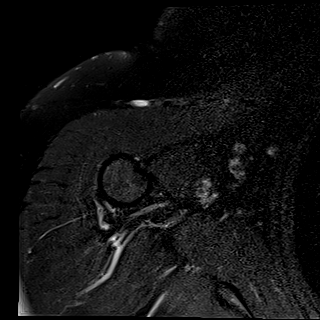
[im 11/26]
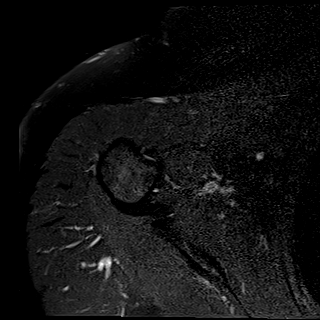
[im 15/26]
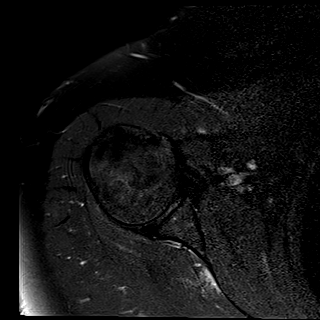
[im 18/26]
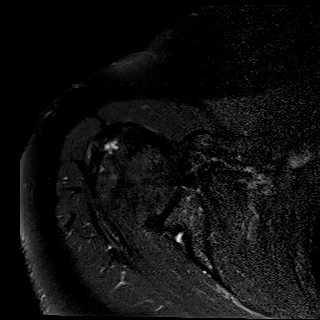
[im 22/26]
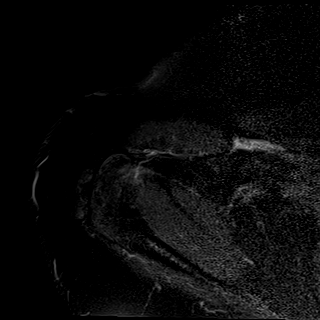
[im 26/26]
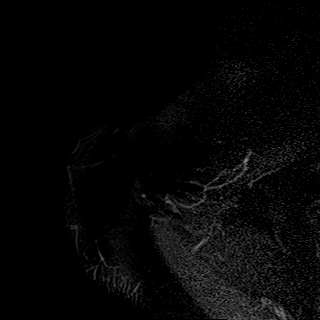

[Series 8: PD · oblique · right · 4.0mm · 0.47mm/px · 8 of 26 slices shown]
[im 1/26]
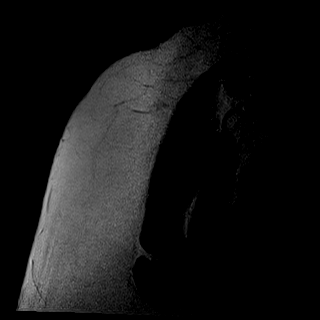
[im 4/26]
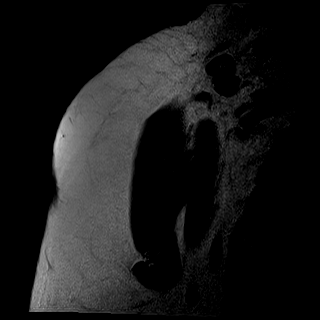
[im 8/26]
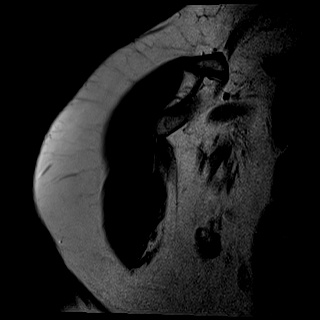
[im 11/26]
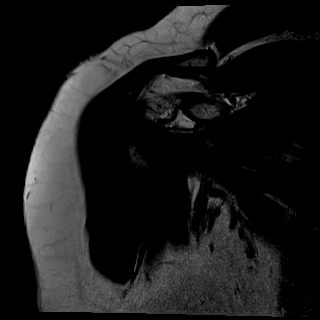
[im 15/26]
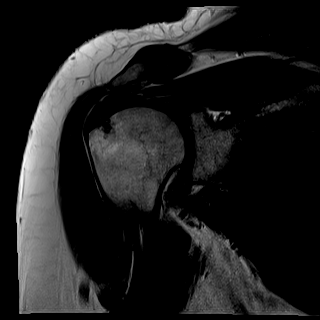
[im 18/26]
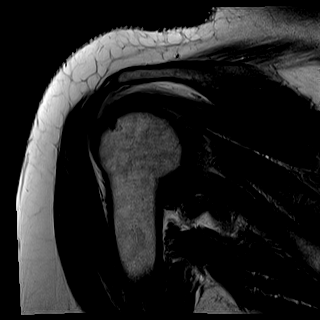
[im 22/26]
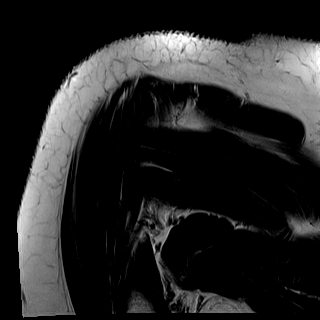
[im 26/26]
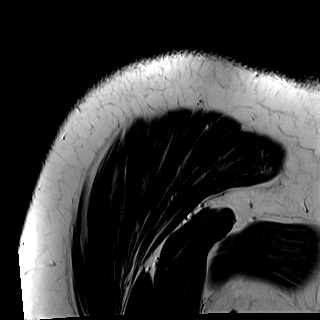

[Series 10: T1 · oblique · right · 4.0mm · 0.41mm/px · 8 of 25 slices shown]
[im 1/25]
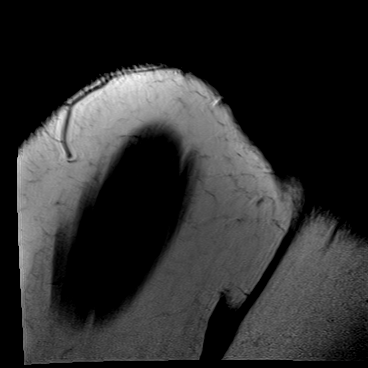
[im 4/25]
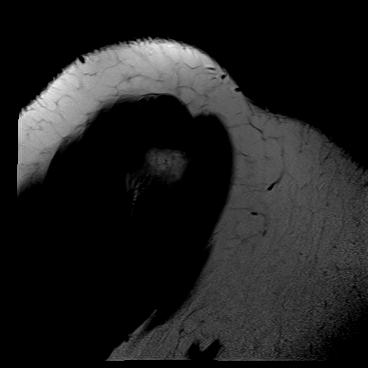
[im 7/25]
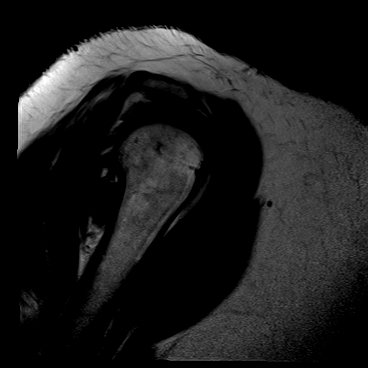
[im 11/25]
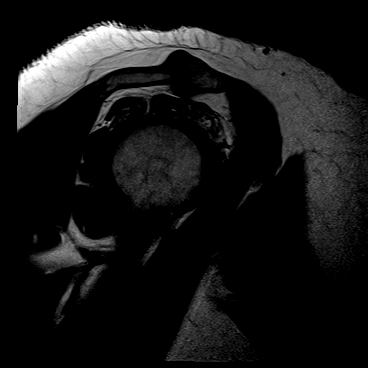
[im 14/25]
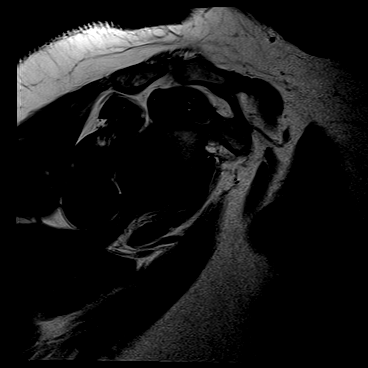
[im 18/25]
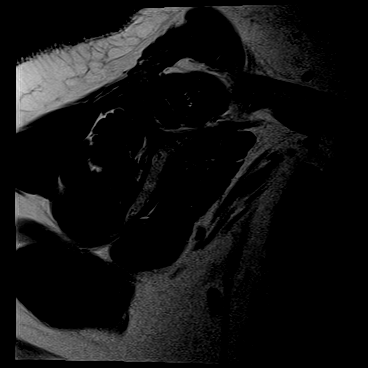
[im 21/25]
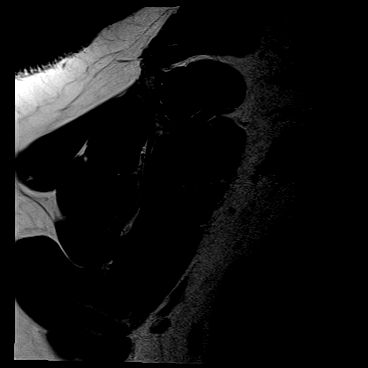
[im 25/25]
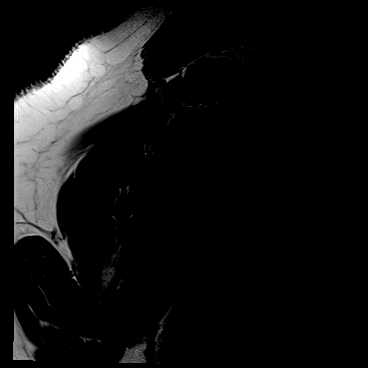

[Series 11: t2_blade_fs_sag · oblique · right · 4.0mm · 0.46mm/px · 8 of 25 slices shown]
[im 1/25]
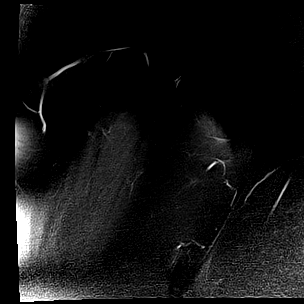
[im 4/25]
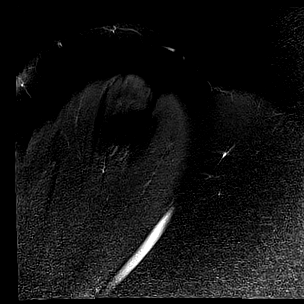
[im 7/25]
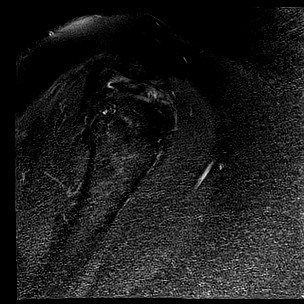
[im 11/25]
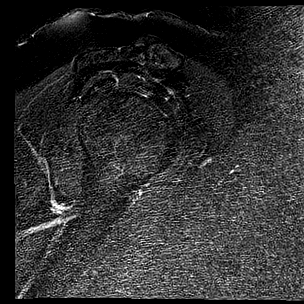
[im 14/25]
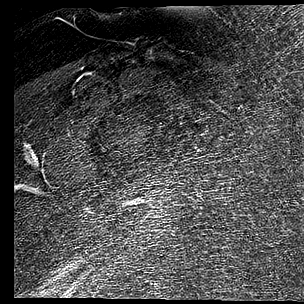
[im 18/25]
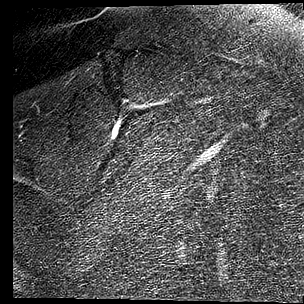
[im 21/25]
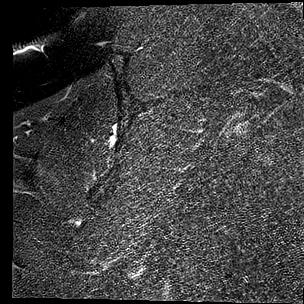
[im 25/25]
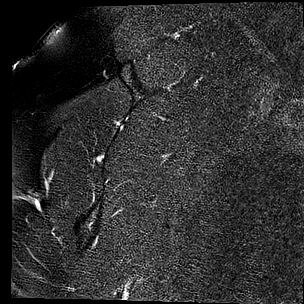

[40 of 40 positions shown; findings below may reference images not displayed]

FINDINGS: Rotator cuff: Mild tendinosis of the supraspinatus tendon with a
tiny partial-thickness articular surface tear and tiny
partial-thickness bursal surface tear. Mild tendinosis of the
infraspinatus tendon with a tiny interstitial tear at the
musculotendinous junction. Teres minor tendon is intact.
Subscapularis tendon is intact.

Muscles: No muscle atrophy or edema. No intramuscular fluid
collection or hematoma.

Biceps Long Head: Intraarticular and extraarticular portions of the
biceps tendon are intact.

Acromioclavicular Joint: Mild arthropathy of the acromioclavicular
joint. No subacromial/subdeltoid bursal fluid.

Glenohumeral Joint: No joint effusion. No chondral defect.

Labrum: Grossly intact, but evaluation is limited by lack of
intraarticular fluid/contrast.

Bones: No fracture or dislocation. No aggressive osseous lesion.

Other: No fluid collection or hematoma.
IMPRESSION: 1. Mild tendinosis of the supraspinatus tendon with a tiny
partial-thickness articular surface tear and tiny partial-thickness
bursal surface tear.
2. Mild tendinosis of the infraspinatus tendon with a tiny
interstitial tear at the musculotendinous junction.

## 2021-01-02 ENCOUNTER — Ambulatory Visit: Payer: Medicare Other | Admitting: Gastroenterology

## 2021-01-12 ENCOUNTER — Other Ambulatory Visit: Payer: Self-pay | Admitting: Orthopedic Surgery

## 2021-01-23 ENCOUNTER — Encounter: Payer: Self-pay | Admitting: Gastroenterology

## 2021-01-27 ENCOUNTER — Other Ambulatory Visit
Admission: RE | Admit: 2021-01-27 | Discharge: 2021-01-27 | Disposition: A | Discharge status: Home / Self Care | Payer: Commercial Managed Care - HMO | Source: Ambulatory Visit | Attending: Orthopedic Surgery | Admitting: Orthopedic Surgery

## 2021-01-27 ENCOUNTER — Other Ambulatory Visit: Payer: Self-pay

## 2021-01-27 DIAGNOSIS — Z01812 Encounter for preprocedural laboratory examination: Secondary | ICD-10-CM

## 2021-01-27 HISTORY — DX: Anemia, unspecified: D64.9

## 2021-01-27 HISTORY — DX: Headache, unspecified: R51.9

## 2021-01-27 HISTORY — DX: Cardiac murmur, unspecified: R01.1

## 2021-01-27 NOTE — Patient Instructions (Addendum)
Your procedure is scheduled on: 02/03/21 Friday Report to the Registration Desk on the 1st floor of the [facility]. To find out your arrival time, please call [phone] between 1PM - 3PM on: 02/02/21 Thursday Report to [facility] for labs/EKG on 01/30/21 at 10:30 am.  REMEMBER: Instructions that are not followed completely may result in serious medical risk, up to and including death; or upon the discretion of your surgeon and anesthesiologist your surgery may need to be rescheduled.  Do not eat food after midnight the night before surgery.  No gum chewing, lozengers or hard candies.  You may however, drink CLEAR liquids up to 2 hours before you are scheduled to arrive for your surgery. Do not drink anything within 2 hours of your scheduled arrival time.  Clear liquids include: - water  - apple juice without pulp - gatorade (not RED, PURPLE, OR BLUE) - black coffee or tea (Do NOT add milk or creamers to the coffee or tea) Do NOT drink anything that is not on this list.  Type 1 and Type 2 diabetics should only drink water.  In addition, your doctor has ordered for you to drink the provided  Ensure Pre-Surgery Clear Carbohydrate Drink  Drinking this carbohydrate drink up to two hours before surgery helps to reduce insulin resistance and improve patient outcomes. Please complete drinking 2 hours prior to scheduled arrival time.  TAKE THESE MEDICATIONS THE MORNING OF SURGERY WITH A SIP OF WATER:  - Brinzolamide-Brimonidine ([place]) 1-0.2 % SUSP  One week prior to surgery: Stop Anti-inflammatories (NSAIDS) such as Advil, Aleve, Ibuprofen, Motrin, Naproxen, Naprosyn and Aspirin based products such as Excedrin, Goodys Powder, BC Powder.  Stop ANY OVER THE COUNTER supplements until after surgery.  You may however, continue to take Tylenol if needed for pain up until the day of surgery.  No Alcohol for 24 hours before or after surgery.  No Smoking including  e-cigarettes for 24 hours prior to surgery.  No chewable tobacco products for at least 6 hours prior to surgery.  No nicotine patches on the day of surgery.  Do not use any "recreational" drugs for at least a week prior to your surgery.  Please be advised that the combination of cocaine and anesthesia may have negative outcomes, up to and including death. If you test positive for cocaine, your surgery will be cancelled.  On the morning of surgery brush your teeth with toothpaste and water, you may rinse your mouth with mouthwash if you wish. Do not swallow any toothpaste or mouthwash.  Use CHG Soap or wipes as directed on instruction sheet.  Do not wear jewelry, make-up, hairpins, clips or nail polish.  Do not wear lotions, powders, or perfumes.   Do not shave body from the neck down 48 hours prior to surgery just in case you cut yourself which could leave a site for infection.  Also, freshly shaved skin may become irritated if using the CHG soap.  Contact lenses, hearing aids and dentures may not be worn into surgery.  Do not bring valuables to the hospital. [facility] is not responsible for any missing/lost belongings or valuables.   Bring your C-PAP to the hospital with you in case you may have to spend the night.   Notify your doctor if there is any change in your medical condition (cold, fever, infection).  Wear comfortable clothing (specific to your surgery type) to the hospital.  After surgery, you can help prevent lung complications by doing breathing  exercises.  Take deep breaths and cough every 1-2 hours. Your doctor may order a device called an Incentive Spirometer to help you take deep breaths. When coughing or sneezing, hold a pillow firmly against your incision with both hands. This is called splinting. Doing this helps protect your incision. It also decreases belly discomfort.  If you are being admitted to the hospital overnight, leave your suitcase in the  car. After surgery it may be brought to your room.  If you are being discharged the day of surgery, you will not be allowed to drive home. You will need a responsible adult (18 years or older) to drive you home and stay with you that night.   If you are taking public transportation, you will need to have a responsible adult (18 years or older) with you. Please confirm with your physician that it is acceptable to use public transportation.   Please call the [facility] at [phone] if you have any questions about these instructions.  Surgery Visitation Policy:  Patients undergoing a surgery or procedure may have one family member or support person with them as long as that person is not COVID-19 positive or experiencing its symptoms.  That person may remain in the waiting area during the procedure and may rotate out with other people.  Inpatient Visitation:    Visiting hours are 7 a.m. to 8 p.m. Up to two visitors ages 16+ are allowed at one time in a patient room. The visitors may rotate out with other people during the day. Visitors must check out when they leave, or other visitors will not be allowed. One designated support person may remain overnight. The visitor must pass COVID-19 screenings, use hand sanitizer when entering and exiting the patients room and wear a mask at all times, including in the patients room. Patients must also wear a mask when staff or their visitor are in the room. Masking is required regardless of vaccination status.

## 2021-01-30 ENCOUNTER — Encounter: Payer: Self-pay | Admitting: Urgent Care

## 2021-01-30 ENCOUNTER — Other Ambulatory Visit
Admission: RE | Admit: 2021-01-30 | Discharge: 2021-01-30 | Disposition: A | Discharge status: Home / Self Care | Payer: Commercial Managed Care - HMO | Source: Ambulatory Visit | Attending: Orthopedic Surgery | Admitting: Orthopedic Surgery

## 2021-01-30 ENCOUNTER — Other Ambulatory Visit: Payer: Self-pay

## 2021-01-30 DIAGNOSIS — Z01818 Encounter for other preprocedural examination: Secondary | ICD-10-CM | POA: Diagnosis present

## 2021-01-30 DIAGNOSIS — Z01812 Encounter for preprocedural laboratory examination: Secondary | ICD-10-CM

## 2021-01-30 LAB — CBC
HCT: 41.9 % (ref 36.0–46.0)
Hemoglobin: 14.5 g/dL (ref 12.0–15.0)
MCH: 32.9 pg (ref 26.0–34.0)
MCHC: 34.6 g/dL (ref 30.0–36.0)
MCV: 95 fL (ref 80.0–100.0)
Platelets: 275 10*3/uL (ref 150–400)
RBC: 4.41 MIL/uL (ref 3.87–5.11)
RDW: 13.6 % (ref 11.5–15.5)
WBC: 8.2 10*3/uL (ref 4.0–10.5)
nRBC: 0 % (ref 0.0–0.2)

## 2021-01-30 LAB — BASIC METABOLIC PANEL
Anion gap: 9 (ref 5–15)
BUN: 21 mg/dL — ABNORMAL HIGH (ref 6–20)
CO2: 27 mmol/L (ref 22–32)
Calcium: 9.4 mg/dL (ref 8.9–10.3)
Chloride: 101 mmol/L (ref 98–111)
Creatinine, Ser: 0.71 mg/dL (ref 0.44–1.00)
GFR, Estimated: >60 mL/min (ref >60)
Glucose, Bld: 105 mg/dL — ABNORMAL HIGH (ref 70–99)
Potassium: 3.9 mmol/L (ref 3.5–5.1)
Sodium: 137 mmol/L (ref 135–145)

## 2021-02-02 MED ORDER — CHLORHEXIDINE GLUCONATE 0.12 % MT SOLN: 15.0000 mL | Freq: Once | OROMUCOSAL | Status: AC

## 2021-02-02 MED ORDER — FAMOTIDINE 20 MG PO TABS: 20.0000 mg | ORAL_TABLET | Freq: Once | ORAL | Status: AC

## 2021-02-02 MED ORDER — ORAL CARE MOUTH RINSE: 15.0000 mL | Freq: Once | OROMUCOSAL | Status: AC

## 2021-02-02 MED ORDER — CEFAZOLIN SODIUM-DEXTROSE 2-4 GM/100ML-% IV SOLN
2.0000 g | INTRAVENOUS | Status: AC
  Administered 2021-02-03: 2 g via INTRAVENOUS

## 2021-02-02 MED ORDER — LACTATED RINGERS IV SOLN: INTRAVENOUS | Status: DC

## 2021-02-03 ENCOUNTER — Ambulatory Visit: Payer: Medicare Other | Admitting: Anesthesiology

## 2021-02-03 ENCOUNTER — Encounter
Admission: RE | Disposition: A | Discharge status: Home / Self Care | Payer: Self-pay | Source: Home / Self Care | Attending: Orthopedic Surgery

## 2021-02-03 ENCOUNTER — Encounter: Payer: Self-pay | Admitting: Orthopedic Surgery

## 2021-02-03 ENCOUNTER — Ambulatory Visit
Admission: RE | Admit: 2021-02-03 | Discharge: 2021-02-03 | Disposition: A | Discharge status: Home / Self Care | Payer: Medicare Other | Attending: Orthopedic Surgery | Admitting: Orthopedic Surgery

## 2021-02-03 ENCOUNTER — Ambulatory Visit: Payer: Medicare Other

## 2021-02-03 DIAGNOSIS — G4730 Sleep apnea, unspecified: Secondary | ICD-10-CM | POA: Insufficient documentation

## 2021-02-03 DIAGNOSIS — Z87891 Personal history of nicotine dependence: Secondary | ICD-10-CM | POA: Insufficient documentation

## 2021-02-03 DIAGNOSIS — M19011 Primary osteoarthritis, right shoulder: Secondary | ICD-10-CM | POA: Diagnosis not present

## 2021-02-03 DIAGNOSIS — M797 Fibromyalgia: Secondary | ICD-10-CM | POA: Insufficient documentation

## 2021-02-03 DIAGNOSIS — I10 Essential (primary) hypertension: Secondary | ICD-10-CM | POA: Diagnosis not present

## 2021-02-03 DIAGNOSIS — M67813 Other specified disorders of tendon, right shoulder: Secondary | ICD-10-CM | POA: Diagnosis not present

## 2021-02-03 DIAGNOSIS — Z419 Encounter for procedure for purposes other than remedying health state, unspecified: Secondary | ICD-10-CM

## 2021-02-03 DIAGNOSIS — M25811 Other specified joint disorders, right shoulder: Secondary | ICD-10-CM | POA: Diagnosis present

## 2021-02-03 DIAGNOSIS — M75111 Incomplete rotator cuff tear or rupture of right shoulder, not specified as traumatic: Secondary | ICD-10-CM | POA: Insufficient documentation

## 2021-02-03 SURGERY — SHOULDER ARTHROSCOPY WITH SUBACROMIAL DECOMPRESSION AND DISTAL CLAVICLE EXCISION
Anesthesia: General | Site: Shoulder | Laterality: Right

## 2021-02-03 MED ORDER — ONDANSETRON HCL 4 MG/2ML IJ SOLN
INTRAMUSCULAR | Status: AC
  Filled 2021-02-03: qty 2

## 2021-02-03 MED ORDER — MIDAZOLAM HCL 2 MG/2ML IJ SOLN
INTRAMUSCULAR | Status: AC
  Administered 2021-02-03: 1 mg via INTRAVENOUS
  Filled 2021-02-03: qty 2

## 2021-02-03 MED ORDER — PROPOFOL 10 MG/ML IV BOLUS
INTRAVENOUS | Status: AC
  Filled 2021-02-03: qty 20

## 2021-02-03 MED ORDER — OXYCODONE HCL 5 MG PO TABS: 5.0000 mg | ORAL_TABLET | Freq: Once | ORAL | Status: DC | PRN

## 2021-02-03 MED ORDER — FENTANYL CITRATE (PF) 100 MCG/2ML IJ SOLN
INTRAMUSCULAR | Status: DC | PRN
  Administered 2021-02-03 (×2): 50 ug via INTRAVENOUS

## 2021-02-03 MED ORDER — ACETAMINOPHEN 10 MG/ML IV SOLN
INTRAVENOUS | Status: DC | PRN
  Administered 2021-02-03: 1000 mg via INTRAVENOUS

## 2021-02-03 MED ORDER — PROMETHAZINE HCL 25 MG/ML IJ SOLN
INTRAMUSCULAR | Status: AC
  Administered 2021-02-03: 6.25 mg
  Filled 2021-02-03: qty 1

## 2021-02-03 MED ORDER — ONDANSETRON HCL 4 MG/2ML IJ SOLN
INTRAMUSCULAR | Status: DC | PRN
  Administered 2021-02-03: 4 mg via INTRAVENOUS

## 2021-02-03 MED ORDER — OXYCODONE HCL 5 MG/5ML PO SOLN: 5.0000 mg | Freq: Once | ORAL | Status: DC | PRN

## 2021-02-03 MED ORDER — CHLORHEXIDINE GLUCONATE 0.12 % MT SOLN
OROMUCOSAL | Status: AC
  Administered 2021-02-03: 15 mL via OROMUCOSAL
  Filled 2021-02-03: qty 15

## 2021-02-03 MED ORDER — FAMOTIDINE 20 MG PO TABS
ORAL_TABLET | ORAL | Status: AC
  Administered 2021-02-03: 20 mg via ORAL
  Filled 2021-02-03: qty 1

## 2021-02-03 MED ORDER — ONDANSETRON 4 MG PO TBDP: 4.0000 mg | ORAL_TABLET | Freq: Three times a day (TID) | ORAL | 0 refills | Status: DC | PRN

## 2021-02-03 MED ORDER — BUPIVACAINE LIPOSOME 1.3 % IJ SUSP
INTRAMUSCULAR | Status: AC
  Filled 2021-02-03: qty 10

## 2021-02-03 MED ORDER — BUPIVACAINE HCL (PF) 0.5 % IJ SOLN
INTRAMUSCULAR | Status: AC
  Filled 2021-02-03: qty 10

## 2021-02-03 MED ORDER — BUPIVACAINE HCL (PF) 0.5 % IJ SOLN
INTRAMUSCULAR | Status: DC | PRN
Start: 2021-02-03 — End: 2021-02-03
  Administered 2021-02-03: 10 mL via PERINEURAL

## 2021-02-03 MED ORDER — SODIUM CHLORIDE 0.9 % IV SOLN: 6.2500 mg | Freq: Four times a day (QID) | INTRAVENOUS | Status: DC | PRN

## 2021-02-03 MED ORDER — ROCURONIUM BROMIDE 10 MG/ML (PF) SYRINGE
PREFILLED_SYRINGE | INTRAVENOUS | Status: AC
  Filled 2021-02-03: qty 10

## 2021-02-03 MED ORDER — LACTATED RINGERS IR SOLN
Status: DC | PRN
  Administered 2021-02-03 (×7): 3000 mL

## 2021-02-03 MED ORDER — FENTANYL CITRATE (PF) 100 MCG/2ML IJ SOLN: 25.0000 ug | INTRAMUSCULAR | Status: DC | PRN

## 2021-02-03 MED ORDER — LACTATED RINGERS IR SOLN
Status: DC | PRN
  Administered 2021-02-03 (×4): 3001 mL

## 2021-02-03 MED ORDER — ACETAMINOPHEN 10 MG/ML IV SOLN: 1000.0000 mg | Freq: Once | INTRAVENOUS | Status: DC | PRN

## 2021-02-03 MED ORDER — MIDAZOLAM HCL 2 MG/2ML IJ SOLN: 1.0000 mg | Freq: Once | INTRAMUSCULAR | Status: AC

## 2021-02-03 MED ORDER — ONDANSETRON HCL 4 MG/2ML IJ SOLN
4.0000 mg | Freq: Once | INTRAMUSCULAR | Status: AC | PRN
  Administered 2021-02-03: 4 mg via INTRAVENOUS

## 2021-02-03 MED ORDER — FENTANYL CITRATE PF 50 MCG/ML IJ SOSY
PREFILLED_SYRINGE | INTRAMUSCULAR | Status: AC
  Administered 2021-02-03: 50 ug via INTRAVENOUS
  Filled 2021-02-03: qty 1

## 2021-02-03 MED ORDER — ACETAMINOPHEN 500 MG PO TABS: 1000.0000 mg | ORAL_TABLET | Freq: Three times a day (TID) | ORAL | 2 refills | Status: AC

## 2021-02-03 MED ORDER — PHENYLEPHRINE HCL-NACL 20-0.9 MG/250ML-% IV SOLN
INTRAVENOUS | Status: DC | PRN
Start: 2021-02-03 — End: 2021-02-03
  Administered 2021-02-03: 20 ug/min via INTRAVENOUS

## 2021-02-03 MED ORDER — SODIUM CHLORIDE 0.9 % IV SOLN: 6.2500 mg | Freq: Once | INTRAVENOUS | Status: DC

## 2021-02-03 MED ORDER — LIDOCAINE HCL (CARDIAC) PF 100 MG/5ML IV SOSY
PREFILLED_SYRINGE | INTRAVENOUS | Status: DC | PRN
Start: 2021-02-03 — End: 2021-02-03
  Administered 2021-02-03: 60 mg via INTRAVENOUS

## 2021-02-03 MED ORDER — ACETAMINOPHEN 10 MG/ML IV SOLN
INTRAVENOUS | Status: AC
  Filled 2021-02-03: qty 100

## 2021-02-03 MED ORDER — ASPIRIN EC 325 MG PO TBEC
325.0000 mg | DELAYED_RELEASE_TABLET | Freq: Every day | ORAL | 0 refills | Status: AC
Start: 2021-02-03 — End: 2021-02-17

## 2021-02-03 MED ORDER — DEXAMETHASONE SODIUM PHOSPHATE 10 MG/ML IJ SOLN
INTRAMUSCULAR | Status: AC
  Filled 2021-02-03: qty 1

## 2021-02-03 MED ORDER — LIDOCAINE HCL (PF) 2 % IJ SOLN
INTRAMUSCULAR | Status: AC
  Filled 2021-02-03: qty 5

## 2021-02-03 MED ORDER — SUCCINYLCHOLINE CHLORIDE 200 MG/10ML IV SOSY
PREFILLED_SYRINGE | INTRAVENOUS | Status: DC | PRN
  Administered 2021-02-03: 120 mg via INTRAVENOUS

## 2021-02-03 MED ORDER — SODIUM CHLORIDE 0.9 % IR SOLN
Status: DC | PRN
  Administered 2021-02-03: 502 mL

## 2021-02-03 MED ORDER — SUGAMMADEX SODIUM 200 MG/2ML IV SOLN
INTRAVENOUS | Status: DC | PRN
  Administered 2021-02-03: 200 mg via INTRAVENOUS

## 2021-02-03 MED ORDER — OXYCODONE HCL 5 MG PO TABS
5.0000 mg | ORAL_TABLET | ORAL | 0 refills | Status: DC | PRN
Start: 2021-02-03 — End: 2021-11-28

## 2021-02-03 MED ORDER — FENTANYL CITRATE (PF) 100 MCG/2ML IJ SOLN
INTRAMUSCULAR | Status: AC
  Filled 2021-02-03: qty 2

## 2021-02-03 MED ORDER — PROPOFOL 10 MG/ML IV BOLUS
INTRAVENOUS | Status: DC | PRN
Start: 2021-02-03 — End: 2021-02-03
  Administered 2021-02-03: 150 mg via INTRAVENOUS
  Administered 2021-02-03: 30 mg via INTRAVENOUS
  Administered 2021-02-03: 20 mg via INTRAVENOUS

## 2021-02-03 MED ORDER — CEFAZOLIN SODIUM-DEXTROSE 2-4 GM/100ML-% IV SOLN
INTRAVENOUS | Status: AC
  Filled 2021-02-03: qty 100

## 2021-02-03 MED ORDER — SUCCINYLCHOLINE CHLORIDE 200 MG/10ML IV SOSY
PREFILLED_SYRINGE | INTRAVENOUS | Status: AC
  Filled 2021-02-03: qty 10

## 2021-02-03 MED ORDER — DEXAMETHASONE SODIUM PHOSPHATE 10 MG/ML IJ SOLN
INTRAMUSCULAR | Status: DC | PRN
  Administered 2021-02-03: 8 mg via INTRAVENOUS

## 2021-02-03 MED ORDER — FENTANYL CITRATE PF 50 MCG/ML IJ SOSY: 50.0000 ug | PREFILLED_SYRINGE | Freq: Once | INTRAMUSCULAR | Status: AC

## 2021-02-03 MED ORDER — EPHEDRINE SULFATE (PRESSORS) 50 MG/ML IJ SOLN
INTRAMUSCULAR | Status: DC | PRN
  Administered 2021-02-03: 10 mg via INTRAVENOUS
  Administered 2021-02-03 (×2): 5 mg via INTRAVENOUS

## 2021-02-03 MED ORDER — ROCURONIUM BROMIDE 100 MG/10ML IV SOLN
INTRAVENOUS | Status: DC | PRN
  Administered 2021-02-03: 10 mg via INTRAVENOUS
  Administered 2021-02-03: 50 mg via INTRAVENOUS

## 2021-02-03 MED ORDER — MIDAZOLAM HCL 2 MG/2ML IJ SOLN
INTRAMUSCULAR | Status: AC
  Filled 2021-02-03: qty 2

## 2021-02-03 MED ORDER — BUPIVACAINE LIPOSOME 1.3 % IJ SUSP
INTRAMUSCULAR | Status: DC | PRN
  Administered 2021-02-03: 10 mL via PERINEURAL

## 2021-02-03 MED ORDER — PHENYLEPHRINE HCL-NACL 20-0.9 MG/250ML-% IV SOLN
INTRAVENOUS | Status: AC
  Filled 2021-02-03: qty 500

## 2021-02-03 SURGICAL SUPPLY — 87 items
ADAPTER IRRIG TUBE 2 SPIKE SOL (ADAPTER) ×3 IMPLANT
ADPR TBG 2 SPK PMP STRL ASCP (ADAPTER) ×1
ANCH SUT 2 SWLK 19.1 CLS EYLT (Anchor) ×1 IMPLANT
ANCH SUT 2X2.3 TAPE (Anchor) ×1 IMPLANT
ANCH SUT 5 3.9 CRKSW KNTLS (Anchor) ×1 IMPLANT
ANCH SUT SHRT 12.5 CANN EYLT (Anchor) ×1 IMPLANT
ANCHOR 2.3 SP SGL 1.2 XBRAID (Anchor) ×1 IMPLANT
ANCHOR 3.9 PEEK CORKSCREW 5MTS (Anchor) ×1 IMPLANT
ANCHOR SUT BIOCOMP LK 2.9X12.5 (Anchor) ×1 IMPLANT
ANCHOR SWIVELOCK BIO 4.75X19.1 (Anchor) ×1 IMPLANT
APL PRP STRL LF DISP 70% ISPRP (MISCELLANEOUS) ×2
BLADE SHAVER 4.5X7 STR FR (MISCELLANEOUS) ×2 IMPLANT
BUR RADIUS 5.5 (BURR) ×1 IMPLANT
CANNULA PART THRD DISP 5.75X7 (CANNULA) ×2 IMPLANT
CANNULA PARTIAL THREAD 2X7 (CANNULA) ×2 IMPLANT
CANNULA TWIST IN 8.25X9CM (CANNULA) ×1 IMPLANT
CHLORAPREP W/TINT 26 (MISCELLANEOUS) ×3 IMPLANT
COOLER POLAR GLACIER W/PUMP (MISCELLANEOUS) ×2 IMPLANT
DEVICE SUCT BLK HOLE OR FLOOR (MISCELLANEOUS) ×2 IMPLANT
DRAPE 3/4 80X56 (DRAPES) ×2 IMPLANT
DRAPE IMP U-DRAPE 54X76 (DRAPES) ×4 IMPLANT
DRAPE INCISE IOBAN 66X45 STRL (DRAPES) ×2 IMPLANT
DRAPE ORTHO SPLIT 77X108 STRL (DRAPES) ×4
DRAPE STERI 35X30 U-POUCH (DRAPES) ×1 IMPLANT
DRAPE SURG ORHT 6 SPLT 77X108 (DRAPES) ×2 IMPLANT
DRAPE U-SHAPE 47X51 STRL (DRAPES) ×4 IMPLANT
DRSG TEGADERM 4X4.75 (GAUZE/BANDAGES/DRESSINGS) ×5 IMPLANT
ELECT REM PT RETURN 9FT ADLT (ELECTROSURGICAL) ×2
ELECTRODE REM PT RTRN 9FT ADLT (ELECTROSURGICAL) IMPLANT
GAUZE SPONGE 4X4 12PLY STRL (GAUZE/BANDAGES/DRESSINGS) ×2 IMPLANT
GAUZE XEROFORM 1X8 LF (GAUZE/BANDAGES/DRESSINGS) ×2 IMPLANT
GLOVE SRG 8 PF TXTR STRL LF DI (GLOVE) ×2 IMPLANT
GLOVE SURG ENC MOIS LTX SZ7.5 (GLOVE) ×2 IMPLANT
GLOVE SURG ORTHO LTX SZ8 (GLOVE) ×2 IMPLANT
GLOVE SURG SYN 7.5  E (GLOVE) ×1
GLOVE SURG SYN 7.5 E (GLOVE) ×1 IMPLANT
GLOVE SURG SYN 7.5 PF PI (GLOVE) ×1 IMPLANT
GLOVE SURG UNDER POLY LF SZ8 (GLOVE) ×4
GOWN STRL REUS W/ TWL LRG LVL3 (GOWN DISPOSABLE) ×2 IMPLANT
GOWN STRL REUS W/TWL LRG LVL3 (GOWN DISPOSABLE) ×4
GOWN STRL REUS W/TWL LRG LVL4 (GOWN DISPOSABLE) ×2 IMPLANT
IV LACTATED RINGER IRRG 3000ML (IV SOLUTION) ×18
IV LR IRRIG 3000ML ARTHROMATIC (IV SOLUTION) ×4 IMPLANT
KIT CORKSCREW KNTLS 3.9 S/T/P (INSTRUMENTS) ×1 IMPLANT
KIT INSERTION 2.9 PUSHLOCK (KITS) ×1 IMPLANT
KIT STABILIZATION SHOULDER (MISCELLANEOUS) ×2 IMPLANT
KIT SUTURETAK 3.0 INSERT PERC (KITS) ×1 IMPLANT
KIT TURNOVER KIT A (KITS) ×2 IMPLANT
MANIFOLD NEPTUNE II (INSTRUMENTS) ×3 IMPLANT
MASK FACE SPIDER DISP (MASK) ×2 IMPLANT
MAT ABSORB  FLUID 56X50 GRAY (MISCELLANEOUS) ×2
MAT ABSORB FLUID 56X50 GRAY (MISCELLANEOUS) ×2 IMPLANT
NDL SAFETY ECLIPSE 18X1.5 (NEEDLE) ×1 IMPLANT
NEEDLE HYPO 18GX1.5 SHARP (NEEDLE) ×2
NEEDLE HYPO 22GX1.5 SAFETY (NEEDLE) ×1 IMPLANT
NS IRRIG 500ML POUR BTL (IV SOLUTION) ×2 IMPLANT
PACK ARTHROSCOPY SHOULDER (MISCELLANEOUS) ×2 IMPLANT
PAD ABD DERMACEA PRESS 5X9 (GAUZE/BANDAGES/DRESSINGS) ×2 IMPLANT
PAD ARMBOARD 7.5X6 YLW CONV (MISCELLANEOUS) ×4 IMPLANT
PAD WRAPON POLAR SHDR XLG (MISCELLANEOUS) ×1 IMPLANT
PASSER SUT FIRSTPASS SELF (INSTRUMENTS) ×2 IMPLANT
PASSER SUT SWIFTSTITCH HIP CRT (INSTRUMENTS) ×1 IMPLANT
PENCIL SMOKE EVACUATOR (MISCELLANEOUS) ×1 IMPLANT
SHAVER BLADE BONE CUTTER  5.5 (BLADE)
SHAVER BLADE BONE CUTTER 5.5 (BLADE) IMPLANT
SLING ULTRA II M (MISCELLANEOUS) ×1 IMPLANT
SPONGE T-LAP 18X18 ~~LOC~~+RFID (SPONGE) ×2 IMPLANT
STRAP SAFETY 5IN WIDE (MISCELLANEOUS) ×2 IMPLANT
SUT ETHILON 3-0 FS-10 30 BLK (SUTURE)
SUT LASSO 90 DEG SD STR (SUTURE) ×1 IMPLANT
SUT MNCRL 4-0 (SUTURE) ×2
SUT MNCRL 4-0 27XMFL (SUTURE) ×1
SUT PDS AB 0 CT1 27 (SUTURE) ×1 IMPLANT
SUT PROLENE 0 CT 1 30 (SUTURE) ×1 IMPLANT
SUT PROLENE 4 0 SH DA (SUTURE) ×2 IMPLANT
SUT VIC AB 0 CT1 36 (SUTURE) ×1 IMPLANT
SUT VIC AB 2-0 CT2 27 (SUTURE) ×2 IMPLANT
SUTURE EHLN 3-0 FS-10 30 BLK (SUTURE) ×1 IMPLANT
SUTURE MNCRL 4-0 27XMF (SUTURE) ×1 IMPLANT
SYR 10ML LL (SYRINGE) ×2 IMPLANT
TAPE CLOTH 3X10 WHT NS LF (GAUZE/BANDAGES/DRESSINGS) ×2 IMPLANT
TUBING CONNECTING 10 (TUBING) ×1 IMPLANT
TUBING INFLOW SET DBFLO PUMP (TUBING) ×2 IMPLANT
TUBING OUTFLOW SET DBLFO PUMP (TUBING) ×2 IMPLANT
WAND WEREWOLF FLOW 90D (MISCELLANEOUS) ×1 IMPLANT
WRAP SHOULDER HOT/COLD PACK (SOFTGOODS) ×1 IMPLANT
WRAPON POLAR PAD SHDR XLG (MISCELLANEOUS) ×2

## 2021-02-03 NOTE — Anesthesia Procedure Notes (Signed)
Procedure Name: Intubation Date/Time: 02/03/2021 10:56 AM Performed by: Letitia Neri, CRNA Pre-anesthesia Checklist: Patient identified, Emergency Drugs available, Suction available, Patient being monitored and Timeout performed Patient Re-evaluated:Patient Re-evaluated prior to induction Oxygen Delivery Method: Circle system utilized Preoxygenation: Pre-oxygenation with 100% oxygen Induction Type: IV induction and Rapid sequence Ventilation: Mask ventilation without difficulty Laryngoscope Size: McGraph and 3 Grade View: Grade II Tube type: Oral Tube size: 6.5 mm Number of attempts: 1 Airway Equipment and Method: Stylet Placement Confirmation: positive ETCO2, CO2 detector and breath sounds checked- equal and bilateral Secured at: 22 cm Tube secured with: Tape Dental Injury: Teeth and Oropharynx as per pre-operative assessment  Difficulty Due To: Difficulty was anticipated Future Recommendations: Recommend- induction with short-acting agent, and alternative techniques readily available

## 2021-02-03 NOTE — Op Note (Addendum)
SURGERY DATE: 02/03/2021   PRE-OP DIAGNOSIS:  1. Right subacromial impingement 2. Right biceps tendinopathy 3. Right partial thickness rotator cuff tear 4.  Right acromioclavicular joint arthritis   POST-OP DIAGNOSIS: 1. Right subacromial impingement 2. Right biceps tendinopathy 3. Right high-grade partial thickness rotator cuff tear (supraspinatus) 4. Right partial-thickness subscapularis tear 5. Right acromioclavicular joint arthritis   PROCEDURES:  1. Right arthroscopic rotator cuff repair (supraspinatus and subscapularis) 2. Right arthroscopic biceps tenodesis 3. Right arthroscopic subacromial decompression 4. Right arthroscopic extensive debridement of shoulder (glenohumeral and subacromial spaces) 5.  Right arthroscopic distal clavicle excision   SURGEON: Cato Mulligan, MD   ASSISTANT: Anitra Lauth, PA; Durwin Glaze, PA-S    ANESTHESIA: Gen with Exparel interscalene block   ESTIMATED BLOOD LOSS: 5cc   DRAINS:  none   TOTAL IV FLUIDS: per anesthesia      SPECIMENS: none   IMPLANTS:  - Arthrex 2.69mm PushLock x 1 - Arthrex 4.15mm SwiveLock x 1 - Iconix SPEED double loaded with 1.2 and 2.30mm tape x 1 - Arthrex 3.21mm Knotless Corkscrew x 1     OPERATIVE FINDINGS:  Examination under anesthesia: A careful examination under anesthesia was performed.  Passive range of motion was: FF: 150; ER at side: 60; ER in abduction: 90; IR in abduction: 45.  Anterior load shift: NT.  Posterior load shift: NT.  Sulcus in neutral: NT.  Sulcus in ER: NT.     Intra-operative findings: A thorough arthroscopic examination of the shoulder was performed.  The findings are: 1. Biceps tendon: tendinopathy with significant erythema  2. Superior labrum: normal 3. Posterior labrum and capsule: normal 4. Inferior capsule and inferior recess: normal 5. Glenoid cartilage surface: Normal 6. Supraspinatus attachment: High-grade partial-thickness articular sided and bursal sided tear 7. Posterior  rotator cuff attachment: normal 8. Humeral head articular cartilage: normal 9. Rotator interval: significant synovitis 10: Subscapularis tendon: Partial-thickness tear of the articular sided fibers 11. Anterior labrum: Mildly degenerative 12. IGHL: normal   OPERATIVE REPORT:    Indications for procedure:  Kara Jimenez is a 61 y.o. female with over 1 year of right shoulder pain.  She has had extensive including medications, activity modifications, exercises, and subacromial corticosteroid injections.  Clinical exam and MRI were suggestive of high-grade rotator cuff tear, biceps tendinopathy, AC joint arthritis, and subacromial impingement. After discussion of risks, benefits, and alternatives to surgery, the patient elected to proceed.    Procedure in detail:   I identified Kara Jimenez in the pre-operative holding area.  I marked the operative shoulder with my initials. I reviewed the risks and benefits of the proposed surgical intervention, and the patient wished to proceed.  Anesthesia was then performed with an Exparel interscalene block.  The patient was transferred to the operative suite and placed in the beach chair position.     Appropriate IV antibiotics were administered prior to incision. The operative upper extremity was then prepped and draped in standard fashion. A time out was performed confirming the correct extremity, correct patient, and correct procedure.    I then created a standard posterior portal with an 11 blade. The glenohumeral joint was easily entered with a blunt trocar and the arthroscope introduced. The findings of diagnostic arthroscopy are described above. I debrided degenerative tissue including the synovitic tissue about the rotator interval and anterior labrum. I then coagulated the inflamed synovium to obtain hemostasis and reduce the risk of post-operative swelling using an Arthrocare radiofrequency device.   I then turned  my attention to the  arthroscopic biceps tenodesis. The Loop n Tack technique was used to pass a FiberTape through the biceps in a locked fashion adjacent to the biceps anchor.  A hole for a 2.9 mm Arthrex PushLock was drilled in the bicipital groove just superior to the subscapularis tendon insertion.  The biceps tendon was then cut and the biceps anchor complex was debrided down to a stable base on the superior labrum.  The FiberTape was loaded onto the PushLock anchor and impacted into place into the previously drilled hole in the bicipital groove.  This appropriately secured the biceps into the bicipital groove and took it off of tension.   Next, arthroscopic repair of the subscapularis was performed. The lesser tuberosity footprint was prepared with a combination of electrocautery and an arthroscopic curette.  An Arthrex knotless corkscrew was placed into the lesser tuberosity footprint from the anterior portal.  A BirdBeak was used to shuttle the repair suture adjacent to the torn portion of the subscapularis tendon.  The suture was then shuttled through the anchor. With the arm in neutral rotation, the repair was tensioned appropriately. This appropriately reduced the subscapularis tear.  The arm was then internally and externally rotated and the subscapularis was noted to move appropriately with rotation.  The remainder of the suture was then cut.  Next, the articular side of the supraspinatus was visualized.  There was significant fraying of the undersurface of the supraspinatus.  This was debrided with an oscillating shaver.  After debridement, there was approximately 10-20% tearing of the articular side anteriorly.  However along the posterior aspect of the supraspinatus insertion, there was approximately 50% tearing of the articular side.  Both the anterior and posterior aspects of the were marked with 0-Prolene sutures for later identification from the bursal side.    The arthroscope was then introduced into the  subacromial space. A direct lateral portal was created with an 11-blade after spinal needle localization. An extensive subacromial bursectomy was performed using a combination of the shaver and Arthrocare wand. The entire acromial undersurface was exposed and the CA ligament was subperiosteally elevated to expose the anterior acromial hook. A burr was used to create a flat anterior and lateral aspect of the acromion, converting it from a Type 2 to a Type 1 acromion. Care was made to keep the deltoid fascia intact.   I then turned my attention to the arthroscopic distal clavicle excision. I identified the acromioclavicular joint. Surrounding bursal tissue was debrided and the edges of the joint were identified. I used the 5.53mm barrel burr to remove the distal clavicle parallel to the edge of the acromion. I was able to fit two widths of the burr into the space between the distal clavicle and acromion, signifying that I had removed ~46mm of distal clavicle. This was confirmed by viewing anteriorly and introducing a probe with measuring marks from the lateral portal.   Next, the previously passed 0-Prolene sutures were identified.  The supraspinatus at the anterior suture appeared intact on the bursal side.  The posterior supraspinatus along the posterior suture had significant bursal sided tearing.  This region was probed.  I was easily able to push through the remaining fibers into the glenohumeral joint, suggesting an essentially full-thickness tear.  Next, I created an accessory posterolateral portal to assist with visualization and instrumentation.  I debrided the poor quality edges of the supraspinatus tendon.  This was a U-shaped tear of the supraspinatus.  I prepared the footprint using  a burr to expose bleeding bone.    I then percutaneously placed 1 Iconix SPEED medial row anchor at the articular margin. I then shuttled all 4 strands of tape through the rotator cuff just lateral to the musculotendinous  junction using a FirstPass suture passer spanning the anterior to posterior extent of the tear. All 4 strands of suture were passed through an [employer] anchor.  This was placed approximately 2 cm distal to the lateral edge of the footprint along the anterior aspect of the tear with appropriate tensioning of each suture prior to final fixation. There was 1 small dogear one anteriorly.  The knotless mechanism of the SwiveLock anchor was utilized to reduce the dogear. This construct allowed for excellent reapproximation of the rotator cuff to its native footprint without undue tension.  Appropriate compression was achieved.  The repair was stable to external and internal rotation.   Fluid was evacuated from the shoulder, and the portals were closed with 3-0 Nylon. Xeroform was applied to the portals. A sterile dressing was applied, followed by a Polar Care sleeve and a SlingShot shoulder immobilizer/sling. The patient was awakened from anesthesia without difficulty and was transferred to the PACU in stable condition.   Additionally, this case had increased complexity compared to standard arthroscopic rotator cuff repair given the involvement of the subscapularis tear. Repair of this tear increased surgical time by 20 minutes and increased complexity due to additional preparation and repair of subscapularis tear and use of an additional implant.   Of note, assistance from a PA was essential to performing the surgery.  PA was present for the entire surgery.  PA assisted with patient positioning, retraction, instrumentation, and wound closure. The surgery would have been more difficult and had longer operative time without PA assistance.   COMPLICATIONS: none   DISPOSITION: plan for discharge home after recovery in PACU     POSTOPERATIVE PLAN: Remain in sling (except hygiene and elbow/wrist/hand RoM exercises as instructed by PT) x 4 weeks and NWB for this time. PT to begin 3-4 days after surgery.   Small/medium rotator cuff repair rehab protocol with subscapularis restrictions. ASA 325mg  daily x 2 weeks for DVT ppx.

## 2021-02-03 NOTE — Anesthesia Postprocedure Evaluation (Signed)
Anesthesia Post Note  Patient: HERMENIA FRITCHER  Procedure(s) Performed: Right shoulder arthroscopic rotator cuff repair , subacromial decompression, distal clavicle excision, and biceps tenodesis (Right: Shoulder)  Patient location during evaluation: PACU Anesthesia Type: General Level of consciousness: awake and alert Pain management: pain level controlled Vital Signs Assessment: post-procedure vital signs reviewed and stable Respiratory status: spontaneous breathing, nonlabored ventilation, respiratory function stable and patient connected to nasal cannula oxygen Cardiovascular status: blood pressure returned to baseline and stable Postop Assessment: no apparent nausea or vomiting Anesthetic complications: no   No notable events documented.   Last Vitals:  Vitals:   02/03/21 1406 02/03/21 1426  BP: (!) 150/98 (!) 154/88  Pulse: 85 84  Resp: 16 20  Temp: (!) 36.1 C (!) 36.1 C  SpO2: 100% 97%    Last Pain:  Vitals:   02/03/21 1426  TempSrc: Temporal  PainSc: 0-No pain                 Arita Miss

## 2021-02-03 NOTE — Anesthesia Procedure Notes (Signed)
Anesthesia Regional Block: Interscalene brachial plexus block   Pre-Anesthetic Checklist: , timeout performed,  Correct Patient, Correct Site, Correct Laterality,  Correct Procedure, Correct Position, site marked,  Risks and benefits discussed,  Surgical consent,  Pre-op evaluation,  At surgeon's request and post-op pain management  Laterality: Right  Prep: chloraprep       Needles:  Injection technique: Single-shot  Needle Type: Echogenic Needle     Needle Length: 4cm  Needle Gauge: 20     Additional Needles:   Narrative:  Injection made incrementally with aspirations every 5 mL.  Performed by: Personally  Anesthesiologist: Arita Miss, MD  Additional Notes: Patient's chart reviewed and they were deemed appropriate candidate for procedure, at surgeon's request. Patient educated about risks, benefits, and alternatives of the block including but not limited to: temporary or permanent nerve damage, bleeding, infection, damage to surround tissues, pneumothorax, hemidiaphragmatic paralysis, unilateral Horner's syndrome, block failure, local anesthetic toxicity. Patient expressed understanding. A formal time-out was conducted consistent with institution rules.  Monitors were applied, and minimal sedation used (see nursing record). The site was prepped with skin prep and allowed to dry, and sterile gloves were used. A high frequency linear ultrasound probe with probe cover was utilized throughout. C5-7 nerve roots located and appeared anatomically normal, local anesthetic injected around them, and echogenic block needle trajectory was monitored throughout. Aspiration performed every 72ml. Lung and blood vessels were avoided. All injections were performed without resistance and free of blood and paresthesias. The patient tolerated the procedure well.  Injectate: 46ml exparel + 12ml 0.5% bupivacaine

## 2021-02-03 NOTE — Discharge Instructions (Addendum)
Post-Op Instructions - Rotator Cuff Repair  1. Bracing: You will wear a shoulder immobilizer or sling for 4 weeks.   2. Driving: No driving for 4 weeks post-op.   3. Activity: No active lifting for 2 months. Wrist, hand, and elbow motion only. Avoid lifting the upper arm away from the body except for hygiene. You are permitted to bend and straighten the elbow passively only (no active elbow motion). You may use your hand and wrist for typing, writing, and managing utensils (cutting food). Do not lift more than a coffee cup for 8 weeks.  When sleeping or resting, inclined positions (recliner chair or wedge pillow) and a pillow under the forearm for support may provide better comfort for up to 4 weeks.  Avoid long distance travel for 4 weeks.  Return to normal activities after rotator cuff repair repair normally takes 6 months on average. If rehab goes very well, may be able to do most activities at 4 months, except overhead or contact sports.  4. Physical Therapy: Begins 3-4 days after surgery, and proceed 1 time per week for the first 6 weeks, then 1-2 times per week from weeks 6-20 post-op.  5. Medications:  - You will be provided a prescription for narcotic pain medicine. After surgery, take 1-2 narcotic tablets every 4 hours if needed for severe pain.  - A prescription for anti-nausea medication will be provided in case the narcotic medicine causes nausea - take 1 tablet every 6 hours only if nauseated.   - Take tylenol 1000 mg (2 Extra Strength tablets or 3 regular strength) every 8 hours for pain.  May decrease or stop tylenol 5 days after surgery if you are having minimal pain. - Take ASA 325mg /day x 2 weeks to help prevent DVTs/PEs (blood clots).  - DO NOT take ANY nonsteroidal anti-inflammatory pain medications (Advil, Motrin, Ibuprofen, Aleve, Naproxen, or Naprosyn). These medicines can inhibit healing of your shoulder repair.    If you are taking prescription medication for anxiety,  depression, insomnia, muscle spasm, chronic pain, or for attention deficit disorder, you are advised that you are at a higher risk of adverse effects with use of narcotics post-op, including narcotic addiction/dependence, depressed breathing, death. If you use non-prescribed substances: alcohol, marijuana, cocaine, heroin, methamphetamines, etc., you are at a higher risk of adverse effects with use of narcotics post-op, including narcotic addiction/dependence, depressed breathing, death. You are advised that taking > 50 morphine milligram equivalents (MME) of narcotic pain medication per day results in twice the risk of overdose or death. For your prescription provided: oxycodone 5 mg - taking more than 6 tablets per day would result in > 50 morphine milligram equivalents (MME) of narcotic pain medication. Be advised that we will prescribe narcotics short-term, for acute post-operative pain only - 3 weeks for major operations such as shoulder repair/reconstruction surgeries.     6. Post-Op Appointment:  Your first post-op appointment will be 10-14 days post-op.  7. Work or School: For most, but not all procedures, we advise staying out of work or school for at least 1 to 2 weeks in order to recover from the stress of surgery and to allow time for healing.   If you need a work or school note this can be provided.   8. Smoking: If you are a smoker, you need to refrain from smoking in the postoperative period. The nicotine in cigarettes will inhibit healing of your shoulder repair and decrease the chance of successful repair. Similarly, nicotine containing  products (gum, patches) should be avoided.   Post-operative Brace: Apply and remove the brace you received as you were instructed to at the time of fitting and as described in detail as the braces instructions for use indicate.  Wear the brace for the period of time prescribed by your physician.  The brace can be cleaned with soap and water and  allowed to air dry only.  Should the brace result in increased pain, decreased feeling (numbness/tingling), increased swelling or an overall worsening of your medical condition, please contact your doctor immediately.  If an emergency situation occurs as a result of wearing the brace after normal business hours, please dial 911 and seek immediate medical attention.  Let your doctor know if you have any further questions about the brace issued to you. Refer to the shoulder sling instructions for use if you have any questions regarding the correct fit of your shoulder sling.  [place] [facility]: [phone]  Video that illustrates how to properly use a shoulder sling: "Instructions for Proper Use of an Orthopaedic Sling" [email or web address]    AMBULATORY SURGERY  DISCHARGE INSTRUCTIONS   The drugs that you were given will stay in your system until tomorrow so for the next 24 hours you should not:  Drive an automobile Make any legal decisions Drink any alcoholic beverage   You may resume regular meals tomorrow.  Today it is better to start with liquids and gradually work up to solid foods.  You may eat anything you prefer, but it is better to start with liquids, then soup and crackers, and gradually work up to solid foods.   Please notify your doctor immediately if you have any unusual bleeding, trouble breathing, redness and pain at the surgery site, drainage, fever, or pain not relieved by medication.    Additional Instructions:        Please contact your physician with any problems or Same Day Surgery at [phone], Monday through Friday 6 am to 4 pm, or [place] [facility] number at [phone].

## 2021-02-03 NOTE — Transfer of Care (Signed)
Immediate Anesthesia Transfer of Care Note  Patient: Kara Jimenez  Procedure(s) Performed: Right shoulder arthroscopic rotator cuff repair , subacromial decompression, distal clavicle excision, and biceps tenodesis (Right: Shoulder)  Patient Location: PACU  Anesthesia Type:General  Level of Consciousness: sedated  Airway & Oxygen Therapy: Patient Spontanous Breathing  Post-op Assessment: Report given to RN and Post -op Vital signs reviewed and stable  Post vital signs: Reviewed and stable  Last Vitals:  Vitals Value Taken Time  BP 155/84 02/03/21 1330  Temp    Pulse 98 02/03/21 1331  Resp 18 02/03/21 1331  SpO2 99 % 02/03/21 1331  Vitals shown include unvalidated device data.  Last Pain:  Vitals:   02/03/21 0928  TempSrc: Temporal  PainSc: 7          Complications: No notable events documented.

## 2021-02-03 NOTE — Anesthesia Preprocedure Evaluation (Addendum)
Anesthesia Evaluation  Patient identified by MRN, date of birth, ID band Patient awake  General Assessment Comment: Prior documented Grade IV view on video laryngoscopy with GlideScope.  Patient has listed complication of anesthesia as severe bradycardia. Upon perusal of anesthetic record, the bradycardia occurred after neostigmine administration. I assured patient we will not be using neostigmine and she has nothing to worry about from that standpoint  Reviewed: Allergy & Precautions, NPO status , Patient's Chart, lab work & pertinent test results  History of Anesthesia Complications Negative for: history of anesthetic complications  Airway Mallampati: III  TM Distance: >3 FB Neck ROM: Full    Dental no notable dental hx. (+) Teeth Intact   Pulmonary sleep apnea and Continuous Positive Airway Pressure Ventilation , neg COPD, Patient abstained from smoking.Not current smoker, former smoker,    Pulmonary exam normal breath sounds clear to auscultation       Cardiovascular Exercise Tolerance: Good METShypertension, (-) CAD and (-) Past MI (-) dysrhythmias  Rhythm:Regular Rate:Normal - Systolic murmurs IMPRESSIONS    1. Left ventricular ejection fraction, by estimation, is 60 to 65%. The  left ventricle has normal function. The left ventricle has no regional  wall motion abnormalities. Left ventricular diastolic parameters are  consistent with Grade I diastolic  dysfunction (impaired relaxation).  2. Right ventricular systolic function is normal. The right ventricular  size is normal. Tricuspid regurgitation signal is inadequate for assessing  PA pressure.  3. Left atrial size was mildly dilated.  4. The mitral valve is normal in structure. Mild mitral valve  regurgitation.    Neuro/Psych  Headaches,  Neuromuscular disease negative psych ROS   GI/Hepatic GERD  ,(+)     (-) substance abuse  ,   Endo/Other  neg  diabetes  Renal/GU negative Renal ROS     Musculoskeletal  (+) Arthritis , Osteoarthritis,  Fibromyalgia -  Abdominal   Peds  Hematology  (+) anemia ,   Anesthesia Other Findings Past Medical History: No date: Anemia No date: Arthritis No date: Breast injury No date: Carpal tunnel syndrome No date: Complication of anesthesia     Comment:  pt. states that her HR dropped to 20s when waking up               after apppendectomy No date: Fibromyalgia No date: GERD (gastroesophageal reflux disease) No date: Headache No date: Heart murmur No date: Hypertension No date: Sciatica No date: Sleep apnea  Reproductive/Obstetrics                            Anesthesia Physical Anesthesia Plan  ASA: 3  Anesthesia Plan: General   Post-op Pain Management: Regional block and Ofirmev IV (intra-op)   Induction: Intravenous and Rapid sequence  PONV Risk Score and Plan: 3 and Ondansetron, Dexamethasone and Midazolam  Airway Management Planned: Oral ETT and Video Laryngoscope Planned  Additional Equipment: None  Intra-op Plan:   Post-operative Plan: Extubation in OR  Informed Consent: I have reviewed the patients History and Physical, chart, labs and discussed the procedure including the risks, benefits and alternatives for the proposed anesthesia with the patient or authorized representative who has indicated his/her understanding and acceptance.     Dental advisory given  Plan Discussed with: CRNA and Surgeon  Anesthesia Plan Comments: (Discussed risks of anesthesia with patient, including PONV, sore throat, lip/dental/eye damage. Rare risks discussed as well, such as cardiorespiratory and neurological sequelae, and allergic reactions. Discussed the  role of CRNA in patient's perioperative care. Patient understands.  Will have difficult airway equipment / Glidescope at the ready in the room.)        Anesthesia Quick Evaluation

## 2021-02-03 NOTE — H&P (Signed)
Paper H&P to be scanned into permanent record. H&P reviewed. No significant changes noted.  

## 2021-02-08 ENCOUNTER — Telehealth: Payer: Self-pay | Admitting: *Deleted

## 2021-02-08 ENCOUNTER — Other Ambulatory Visit: Payer: Self-pay

## 2021-02-08 ENCOUNTER — Telehealth (INDEPENDENT_AMBULATORY_CARE_PROVIDER_SITE_OTHER): Payer: Commercial Managed Care - HMO | Admitting: Gastroenterology

## 2021-02-08 ENCOUNTER — Encounter: Payer: Self-pay | Admitting: Gastroenterology

## 2021-02-08 DIAGNOSIS — K625 Hemorrhage of anus and rectum: Secondary | ICD-10-CM | POA: Diagnosis not present

## 2021-02-08 DIAGNOSIS — K869 Disease of pancreas, unspecified: Secondary | ICD-10-CM

## 2021-02-08 DIAGNOSIS — K5900 Constipation, unspecified: Secondary | ICD-10-CM

## 2021-02-08 DIAGNOSIS — R1310 Dysphagia, unspecified: Secondary | ICD-10-CM

## 2021-02-08 NOTE — Telephone Encounter (Signed)
[name], you are scheduled for a virtual visit with your provider today.  Just as we do with appointments in the office, we must obtain your consent to participate.  Your consent will be active for this visit and any virtual visit you may have with one of our providers in the next 365 days.  If you have a MyChart account, I can also send a copy of this consent to you electronically.  All virtual visits are billed to your insurance company just like a traditional visit in the office.  As this is a virtual visit, video technology does not allow for your provider to perform a traditional examination.  This may limit your provider's ability to fully assess your condition.  If your provider identifies any concerns that need to be evaluated in person or the need to arrange testing such as labs, EKG, etc, we will make arrangements to do so.  Although advances in technology are sophisticated, we cannot ensure that it will always work on either your end or our end.  If the connection with a video visit is poor, we may have to switch to a telephone visit.  With either a video or telephone visit, we are not always able to ensure that we have a secure connection.   I need to obtain your verbal consent now.   Are you willing to proceed with your visit today?

## 2021-02-08 NOTE — Progress Notes (Signed)
Primary Care Physician:  Zhou-Talbert, Elwyn Lade, MD Primary GI:  Garfield Cornea, MD    Patient Location: Home  Provider Location: [facility] office  Reason for Visit:  Chief Complaint  Patient presents with   Follow-up    EGD and colonoscopy results      Persons present on the virtual encounter, with roles: Patient, myself (provider),Courtney Laurin Coder CMA (updated meds and allergies)  Total time (minutes) spent on medical discussion: 15 minutes  Due to COVID-19, visit was conducted using Mychart video method.  Visit was requested by patient.  Virtual Visit via Mychart video  I connected with Kara Jimenez on 02/08/21 at  3:30 PM EST by Mychart video and verified that I am speaking with the correct person using two identifiers.   I discussed the limitations, risks, security and privacy concerns of performing an evaluation and management service by telephone/video and the availability of in person appointments. I also discussed with the patient that there may be a patient responsible charge related to this service. The patient expressed understanding and agreed to proceed.   HPI:   Kara Jimenez is a 61 y.o. female who presents for virtual visit regarding dysphagia, abdominal pain, constipation, rectal bleeding. 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 Last seen in 06/2020, updated imaging stable with plans for repeat MRI in 6 months for surveillance.  Last seen in 04/2020. Since her last visit, she completed EGD/colonoscopy in 08/2020. She had H.pylori gastritis, esophagus empirically dilated. She had colonic diverticulosis, hemorrhoids, single tubular adenoma removed. Next colonoscopy in 7 years.   She completed her h.pylori treatment. States her PCP did H.pylori breath test about a month later and was negative. She says she was off PPI and Abx for at least a month before the test. If fact,  she did not continue PPI after H.pylori treatment.   Today: BMs regular on Benefiber daily. No melena, brbpr. No UGI symptoms. Dysphagia resolved. No abdominal pain.   Current Outpatient Medications  Medication Sig Dispense Refill   acetaminophen (TYLENOL) 500 MG tablet Take 2 tablets (1,000 mg total) by mouth every 8 (eight) hours. 90 tablet 2   aspirin EC 325 MG tablet Take 1 tablet (325 mg total) by mouth daily for 14 days. 14 tablet 0   Brinzolamide-Brimonidine (SIMBRINZA) 1-0.2 % SUSP Place 1 drop into both eyes 3 (three) times daily.     lisinopril (ZESTRIL) 5 MG tablet Take 5 mg by mouth daily.     loratadine (CLARITIN) 10 MG tablet Take 10 mg by mouth daily as needed for allergies.     ondansetron (ZOFRAN-ODT) 4 MG disintegrating tablet Take 1 tablet (4 mg total) by mouth every 8 (eight) hours as needed for nausea or vomiting. 20 tablet 0   oxyCODONE (ROXICODONE) 5 MG immediate release tablet Take 1-2 tablets (5-10 mg total) by mouth every 4 (four) hours as needed (pain). 30 tablet 0   No current facility-administered medications for this visit.    Past Medical History:  Diagnosis Date   Anemia    Arthritis    Breast injury    Carpal tunnel syndrome    Complication of anesthesia    pt. states that her HR dropped to 20s when waking up after apppendectomy   Fibromyalgia    GERD (gastroesophageal reflux disease)    Headache    Heart murmur    Hypertension    Sciatica  Sleep apnea     Past Surgical History:  Procedure Laterality Date   BIOPSY  09/09/2020   Procedure: BIOPSY;  Surgeon: Daneil Dolin, MD;  [facility]: [facility];  Service: Endoscopy;;   BREAST EXCISIONAL BIOPSY Right    benign   CARPAL TUNNEL RELEASE Bilateral    CESAREAN SECTION     COLONOSCOPY WITH PROPOFOL N/A 09/09/2020   Procedure: COLONOSCOPY WITH PROPOFOL;  Surgeon: Daneil Dolin, MD;  Location: [facility];  Service: Endoscopy;  Laterality: N/A;  12:30pm   ESOPHAGOGASTRODUODENOSCOPY (EGD)  WITH PROPOFOL N/A 09/09/2020   Procedure: ESOPHAGOGASTRODUODENOSCOPY (EGD) WITH PROPOFOL;  Surgeon: Daneil Dolin, MD;  Location: [facility];  Service: Endoscopy;  Laterality: N/A;   LAPAROSCOPIC APPENDECTOMY N/A 11/05/2019   Procedure: APPENDECTOMY LAPAROSCOPIC;  Surgeon: Virl Cagey, MD;  Location: [facility];  Service: General;  Laterality: N/A;   MALONEY DILATION N/A 09/09/2020   Procedure: Keturah Shavers;  Surgeon: Daneil Dolin, MD;  Location: [facility];  Service: Endoscopy;  Laterality: N/A;   NECK SURGERY     PARTIAL HYSTERECTOMY     POLYPECTOMY  09/09/2020   Procedure: POLYPECTOMY INTESTINAL;  Surgeon: Daneil Dolin, MD;  Location: [facility];  Service: Endoscopy;;   ROTATOR CUFF REPAIR Left    TUBAL LIGATION      Family History  Problem Relation Age of Onset   HIV/AIDS Mother    Hypertension Father     Social History   Socioeconomic History   Marital status: Single    Spouse name: Not on file   Number of children: Not on file   Years of education: Not on file   Highest education level: Not on file  Occupational History   Not on file  Tobacco Use   Smoking status: Former   Smokeless tobacco: Never  Vaping Use   Vaping Use: Never used  Substance and Sexual Activity   Alcohol use: Not Currently    Comment: occaisional   Drug use: Yes    Types: Marijuana    Comment: last used last week   Sexual activity: Not on file  Other Topics Concern   Not on file  Social History Narrative   Nephew lives with her   Social Determinants of Health   Financial Resource Strain: Not on file  Food Insecurity: Not on file  Transportation Needs: Not on file  Physical Activity: Not on file  Stress: Not on file  Social Connections: Not on file  Intimate Partner Violence: Not on file      ROS:  General: Negative for anorexia, weight loss, fever, chills, fatigue, weakness. Eyes: Negative for vision changes.  ENT: Negative for hoarseness, difficulty  swallowing , nasal congestion. CV: Negative for chest pain, angina, palpitations, dyspnea on exertion, peripheral edema.  Respiratory: Negative for dyspnea at rest, dyspnea on exertion, cough, sputum, wheezing.  GI: See history of present illness. GU:  Negative for dysuria, hematuria, urinary incontinence, urinary frequency, nocturnal urination.  MS: Negative for   low back pain. Recovering from rotator cuff surgery Derm: Negative for rash or itching.  Neuro: Negative for weakness, abnormal sensation, seizure, frequent headaches, memory loss, confusion.  Psych: Negative for anxiety, depression, suicidal ideation, hallucinations.  Endo: Negative for unusual weight change.  Heme: Negative for bruising or bleeding. Allergy: Negative for rash or hives.   Observations/Objective: Right arm in sling. Well nourished, NAD. No SOB. Alert and oriented.   Lab Results  Component Value Date   CREATININE  0.71 01/30/2021   BUN 21 (H) 01/30/2021   NA 137 01/30/2021   K 3.9 01/30/2021   CL 101 01/30/2021   CO2 27 01/30/2021   Lab Results  Component Value Date   WBC 8.2 01/30/2021   HGB 14.5 01/30/2021   HCT 41.9 01/30/2021   MCV 95.0 01/30/2021   PLT 275 01/30/2021   Lab Results  Component Value Date   ALT 16 11/05/2019   AST 18 11/05/2019   ALKPHOS 61 11/05/2019   BILITOT 0.7 11/05/2019     Assessment and Plan:  Dysphagia: resolved s/p esophageal dilation.   Constipation: doing well on Benefiber.   Rectal bleeding: s/p colonoscopy. Likely due to hemorrhoids. No recent issues with regular bowel movements and no straining.   H.pylori gastritis: s/p treatment. Patient reports H.pylori breath test confirmed eradication.   Pancreatic lesion: continue to follow with [facility] for surveillance.   H/O adenomatous colon polyps: Next colonoscopy due in 08/2027.  Follow Up Instructions:    I discussed the assessment and treatment plan with the patient. The patient was provided an  opportunity to ask questions and all were answered. The patient agreed with the plan and demonstrated an understanding of the instructions. AVS mailed to patient's home address.   The patient was advised to call back or seek an in-person evaluation if the symptoms worsen or if the condition fails to improve as anticipated.  I provided 15 minutes of virtual face-to-face time during this encounter.   Neil Crouch, PA-C

## 2021-02-08 NOTE — Telephone Encounter (Signed)
Pt consented to a virtual visit. 

## 2021-02-10 ENCOUNTER — Ambulatory Visit: Payer: Medicare Other | Admitting: Gastroenterology

## 2021-02-11 ENCOUNTER — Telehealth: Payer: Self-pay | Admitting: Gastroenterology

## 2021-02-11 ENCOUNTER — Encounter: Payer: Self-pay | Admitting: Gastroenterology

## 2021-02-11 NOTE — Patient Instructions (Signed)
Continue Benefiber daily to manage constipation.  Continue to follow with [facility] regarding pancreatic lesion. Your next colonoscopy will be due in 08/2027.  Return to the office as needed.

## 2021-02-11 NOTE — Telephone Encounter (Signed)
Please send AVS to patient.

## 2021-02-17 ENCOUNTER — Other Ambulatory Visit (HOSPITAL_COMMUNITY): Payer: Self-pay | Admitting: Family Medicine

## 2021-02-17 DIAGNOSIS — Z1231 Encounter for screening mammogram for malignant neoplasm of breast: Secondary | ICD-10-CM

## 2021-03-16 ENCOUNTER — Other Ambulatory Visit: Payer: Self-pay | Admitting: Neurology

## 2021-03-16 DIAGNOSIS — G501 Atypical facial pain: Secondary | ICD-10-CM

## 2021-03-24 ENCOUNTER — Ambulatory Visit
Admission: RE | Admit: 2021-03-24 | Discharge: 2021-03-24 | Disposition: A | Discharge status: Home / Self Care | Payer: Medicare Other | Source: Ambulatory Visit | Attending: Neurology | Admitting: Neurology

## 2021-03-24 ENCOUNTER — Other Ambulatory Visit: Payer: Self-pay

## 2021-03-24 DIAGNOSIS — R6884 Jaw pain: Secondary | ICD-10-CM | POA: Insufficient documentation

## 2021-03-24 DIAGNOSIS — G501 Atypical facial pain: Secondary | ICD-10-CM | POA: Diagnosis present

## 2021-03-24 IMAGING — MR MR FACE/TRIGEMINAL WO/W CM
7 of 11 series · 25 of 48 positions shown · IV contrast (10ml Gadavist)
Comparison: [DATE] brain MRI

CLINICAL DATA: Left jaw pain radiating to the scalp. Right jaw pain
at times with sharp pain in the temple. 3-4 episodes since [DATE].

EXAM:
MRI FACE TRIGEMINAL WITHOUT AND WITH CONTRAST
TECHNIQUE: Multiplanar, multi-echo pulse sequences of the face and surrounding
structures, including thin-slice imaging of the trigeminal nerves,
were acquired before and after intravenous contrast administration.
CONTRAST:  10mL GADAVIST GADOBUTROL 1 MMOL/ML IV SOLN

[Series 5: T1 · sagittal · 3.0mm · 0.35mm/px · 4 of 40 slices shown (1 of 3)]
[im 1/40]
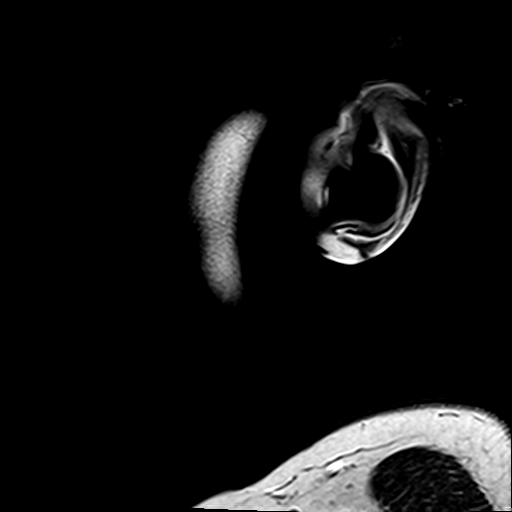
[im 14/40]
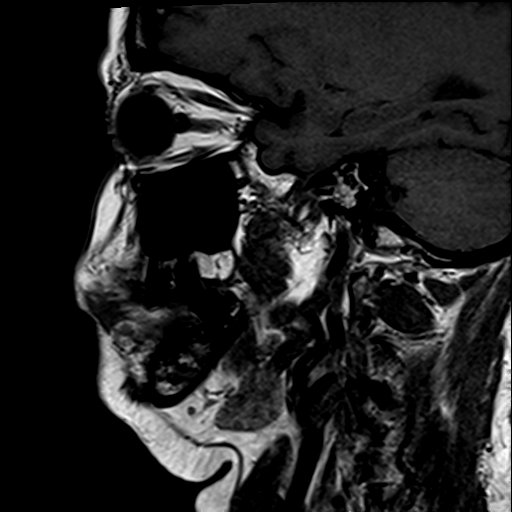
[im 27/40]
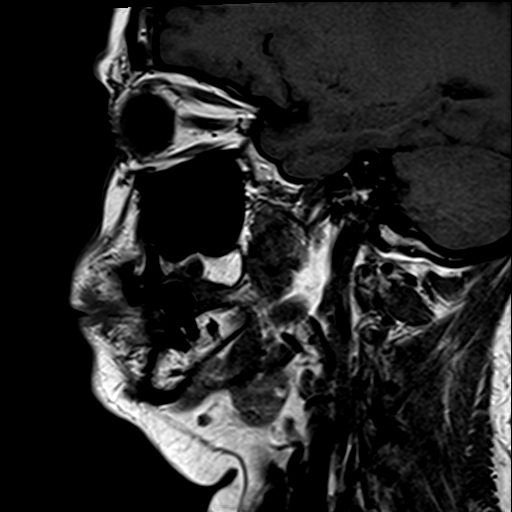
[im 40/40]
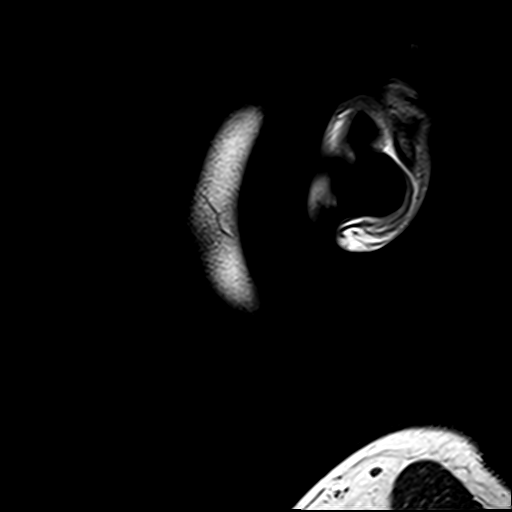

[Series 6: T2 · coronal · 3.0mm · 0.70mm/px · 4 of 40 slices shown (1 of 2)]
[im 1/40]
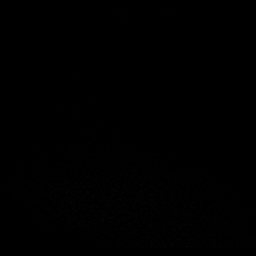
[im 14/40]
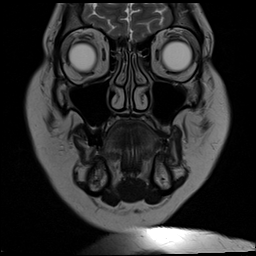
[im 27/40]
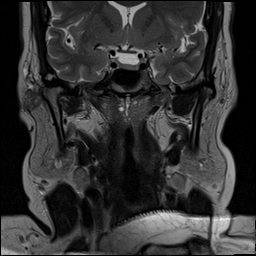
[im 40/40]
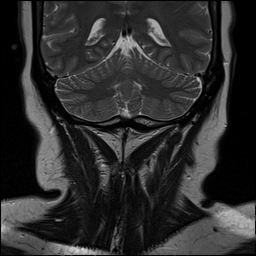

[Series 9: T1 · axial · 3.0mm · 0.35mm/px · z∈[-112,+18]mm · 4 of 35 slices shown (2 of 3)]
[im 1/35]
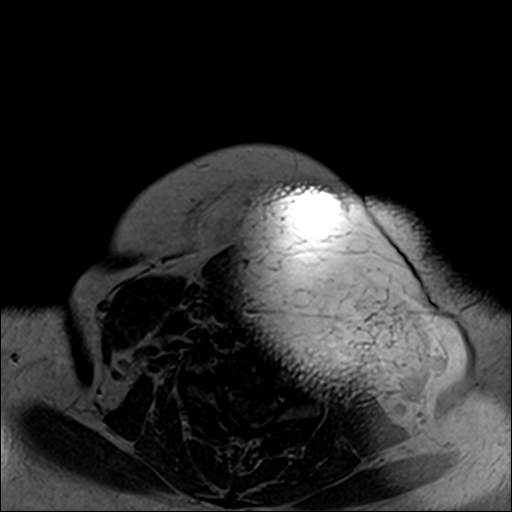
[im 12/35]
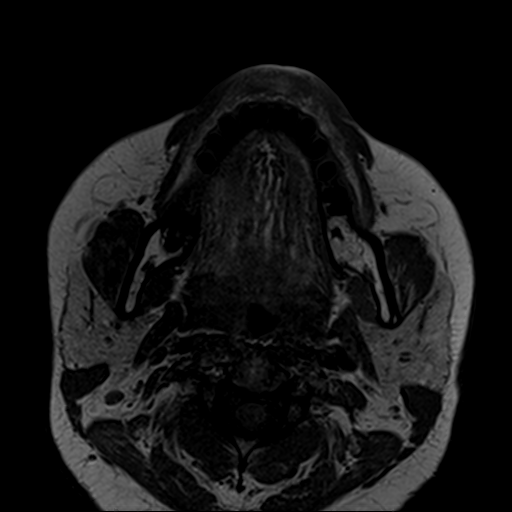
[im 23/35]
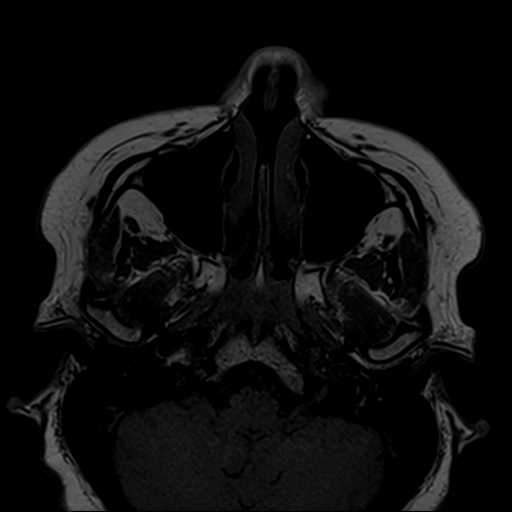
[im 35/35]
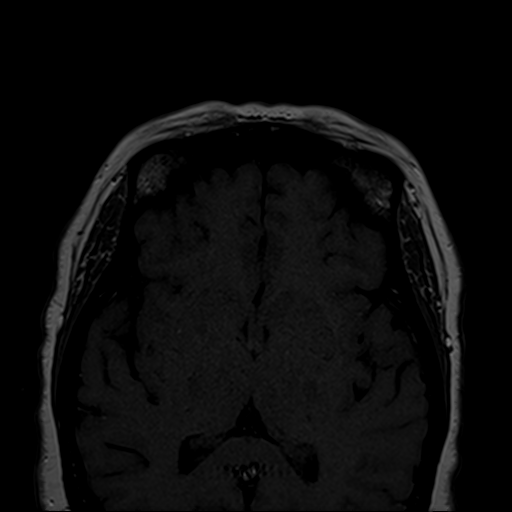

[Series 10: T1 · coronal · 3.0mm · 0.35mm/px · 4 of 40 slices shown (3 of 3)]
[im 1/40]
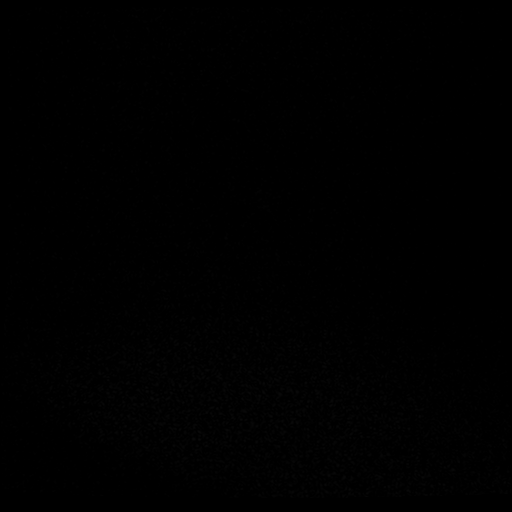
[im 14/40]
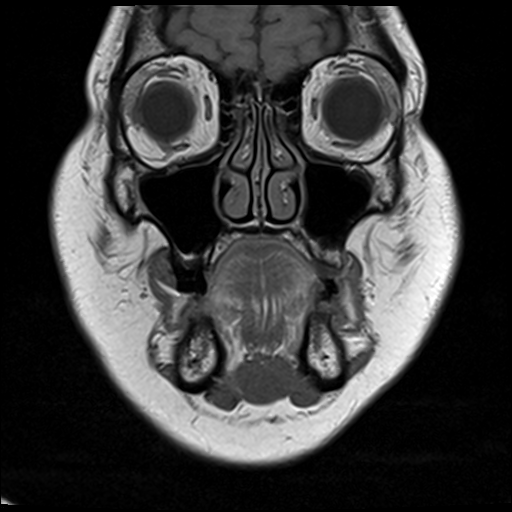
[im 27/40]
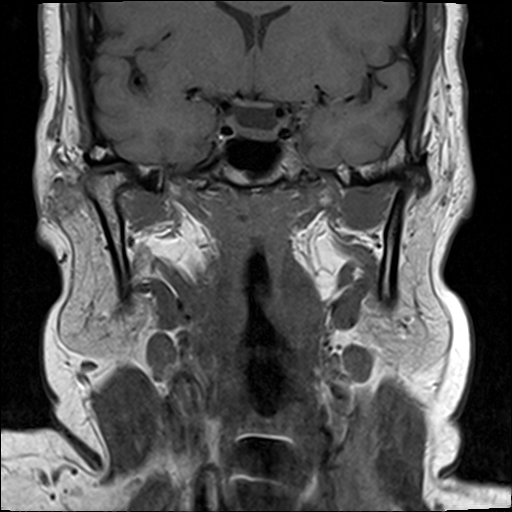
[im 40/40]
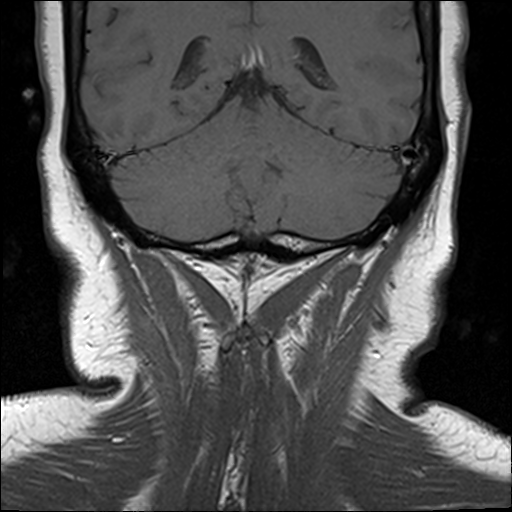

[Series 11: T2 · coronal · 3.0mm · 0.70mm/px · 4 of 40 slices shown (2 of 2)]
[im 1/40]
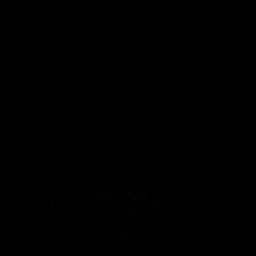
[im 14/40]
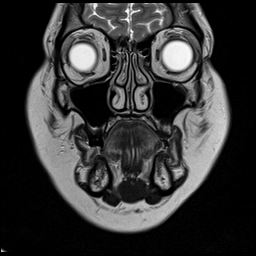
[im 27/40]
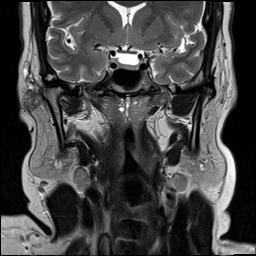
[im 40/40]
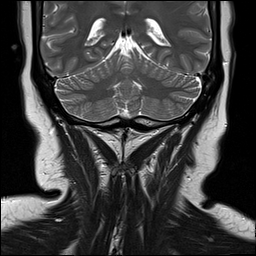

[Series 12: T1 fat-sat post-contrast · axial · 3.0mm · 0.35mm/px · z∈[-112,+18]mm · 4 of 35 slices shown (1 of 2)]
[im 1/35]
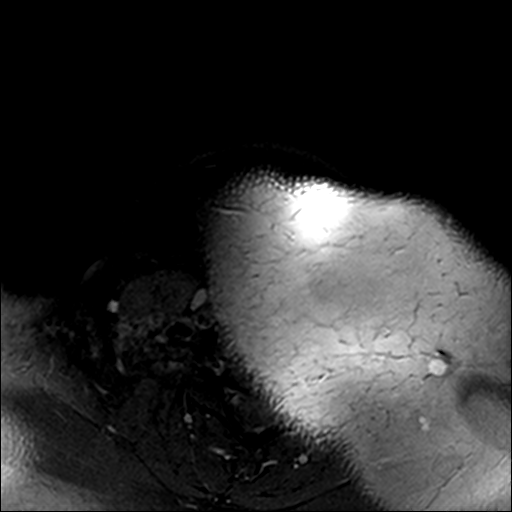
[im 12/35]
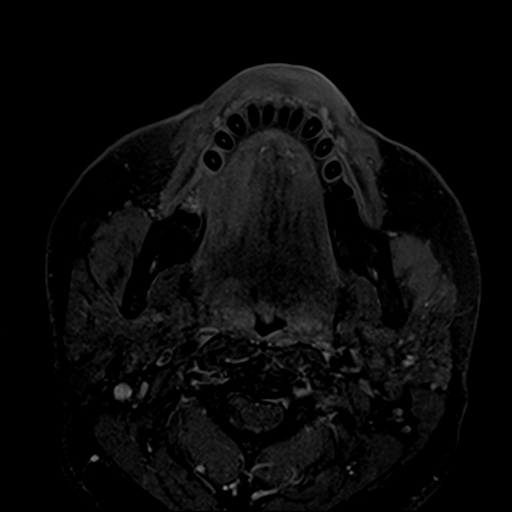
[im 23/35]
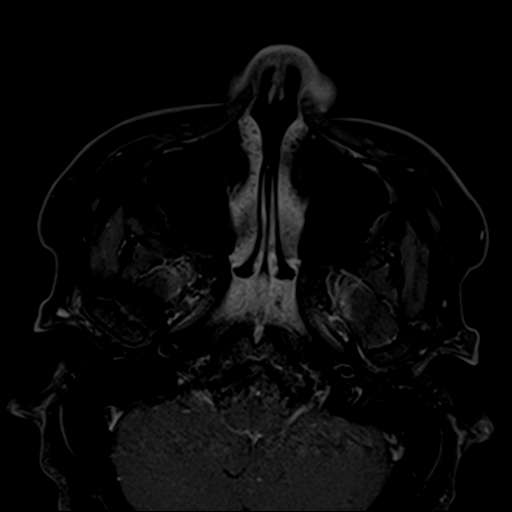
[im 35/35]
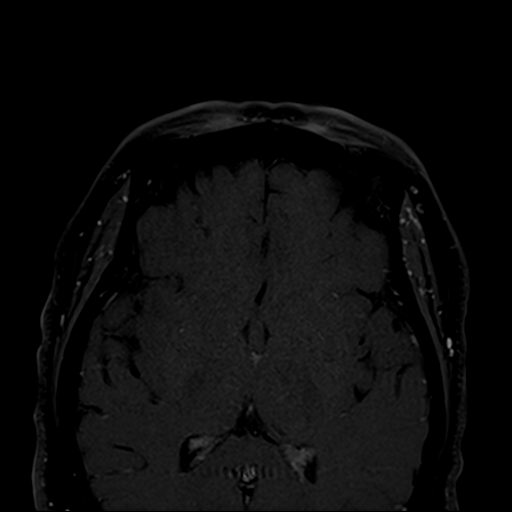

[Series 13: T1 fat-sat post-contrast · coronal · 3.0mm · 0.35mm/px · 1 of 40 slices shown (2 of 2)]
[im 1/40]
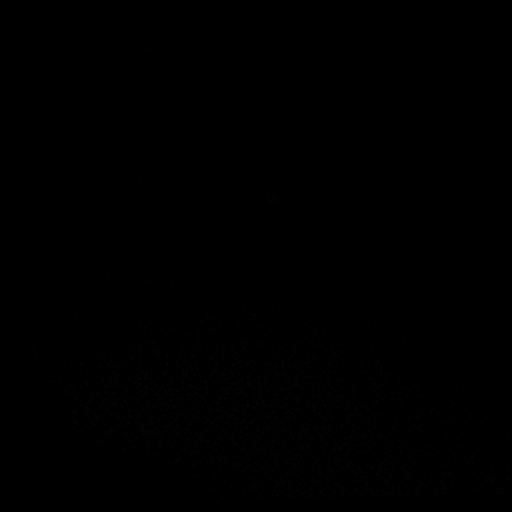

[25 of 48 positions shown; findings below may reference images not displayed]

FINDINGS: Limited intracranial/Trigeminal nerves: No abnormal thickening or
enhancement at the level of the trigeminal nerves and their
branches. Normal appearance of the prepontine cistern and brainstem.
Symmetric muscles of mastication and skull base fat.

Vascular: Unremarkable

Sinuses/Orbits: Unremarkable

Soft tissues: No visible inflammation or mass

Osseous: Hypointensity in the right mandibular body medullary space
without abnormal enhancement, likely condensing osteitis from dental
disease, stable from [DATE].
IMPRESSION: Negative MRI of the trigeminal nerves.

## 2021-03-24 MED ORDER — GADOBUTROL 1 MMOL/ML IV SOLN
10.0000 mL | Freq: Once | INTRAVENOUS | Status: AC | PRN
  Administered 2021-03-24: 10 mL via INTRAVENOUS

## 2021-05-10 ENCOUNTER — Other Ambulatory Visit: Payer: Medicare Other

## 2021-05-10 ENCOUNTER — Ambulatory Visit
Admission: RE | Admit: 2021-05-10 | Discharge: 2021-05-10 | Disposition: A | Discharge status: Home / Self Care | Payer: Medicare Other | Source: Ambulatory Visit | Attending: Orthopedic Surgery | Admitting: Orthopedic Surgery

## 2021-05-10 ENCOUNTER — Other Ambulatory Visit: Payer: Self-pay | Admitting: Orthopedic Surgery

## 2021-05-10 DIAGNOSIS — M7989 Other specified soft tissue disorders: Secondary | ICD-10-CM | POA: Diagnosis present

## 2021-05-10 DIAGNOSIS — M79601 Pain in right arm: Secondary | ICD-10-CM | POA: Diagnosis not present

## 2021-05-10 IMAGING — US US EXTREM  UP VENOUS*R*
1 series · 13 of 24 positions shown · non-contrast
Comparison: None.

CLINICAL DATA: Right upper extremity pain and swelling for the past
3 months.



[Series 1: us venous img upper uni right (dvt) · portal-venous · 13 of 36 slices shown]
[im 1/36]
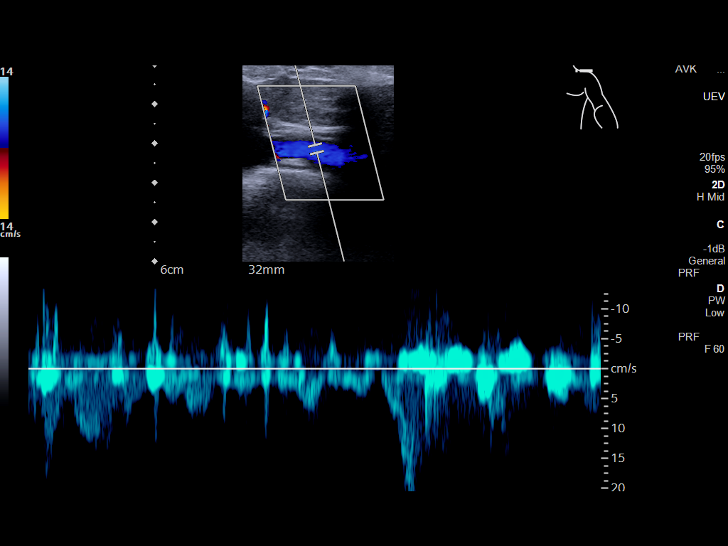
[im 4/36]
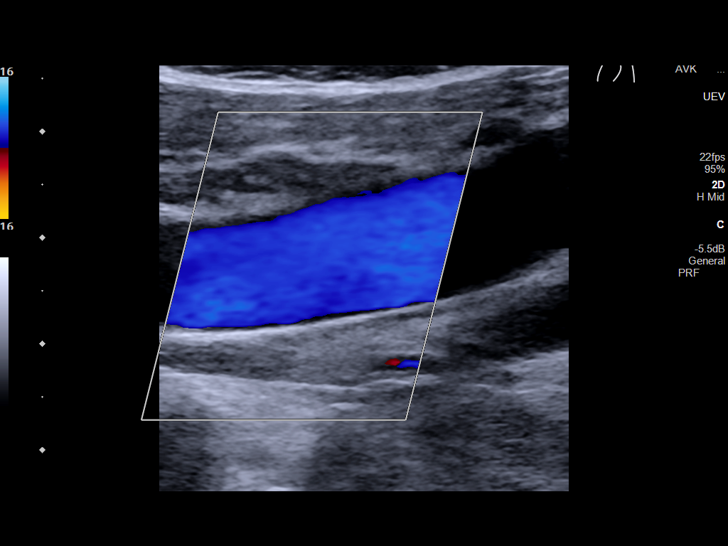
[im 7/36]
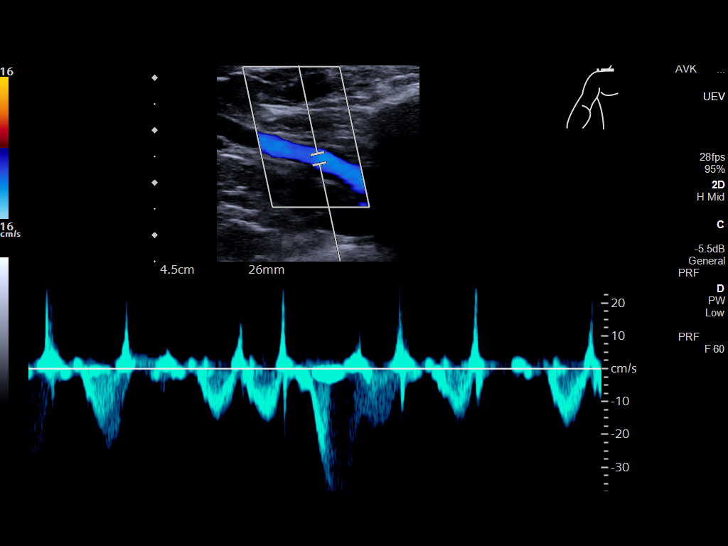
[im 10/36]
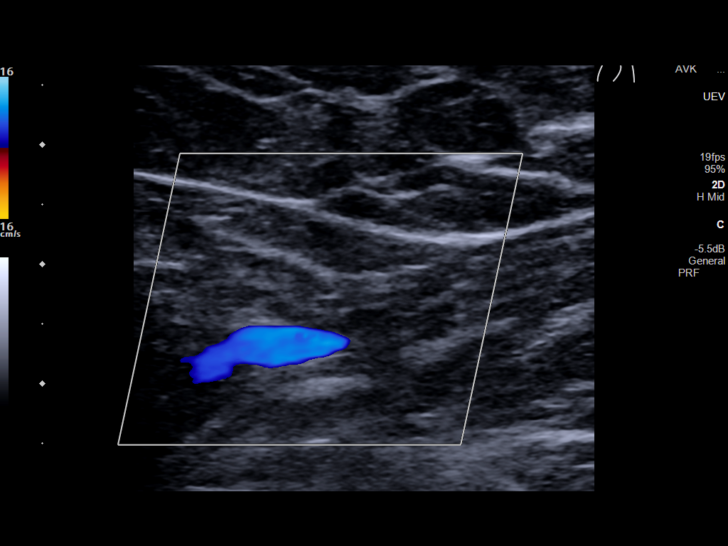
[im 13/36]
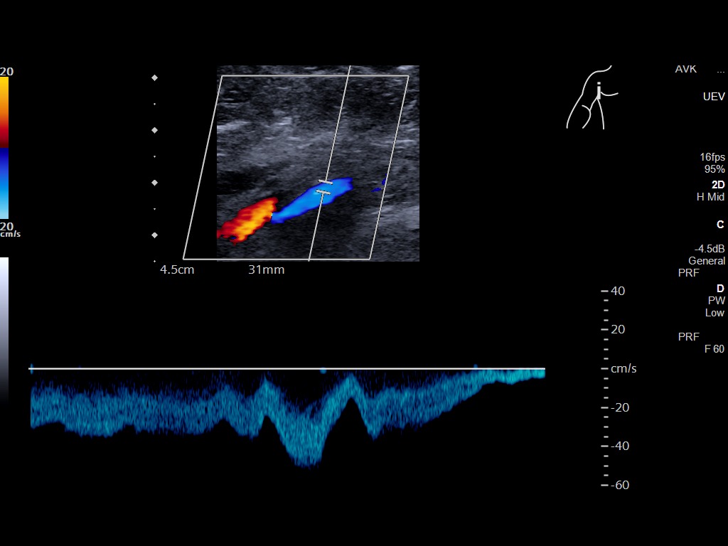
[im 16/36]
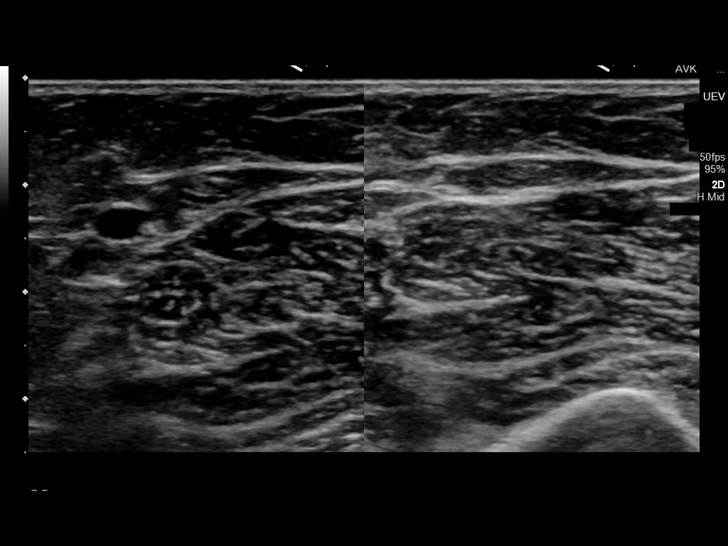
[im 19/36]
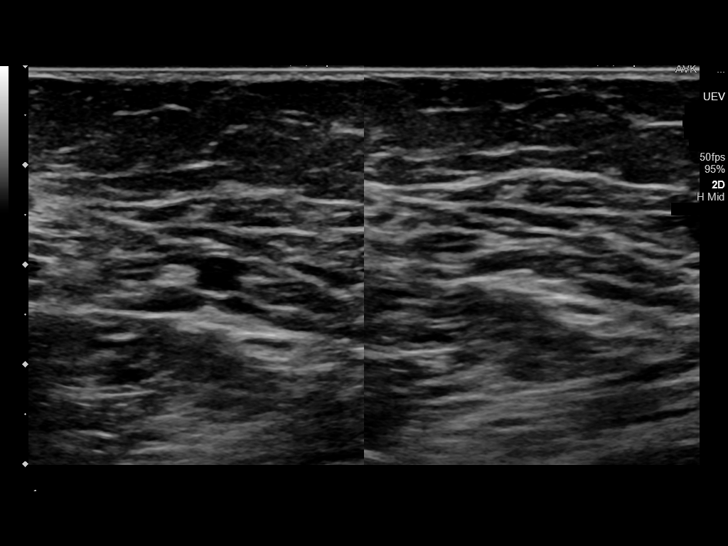
[im 20/36]
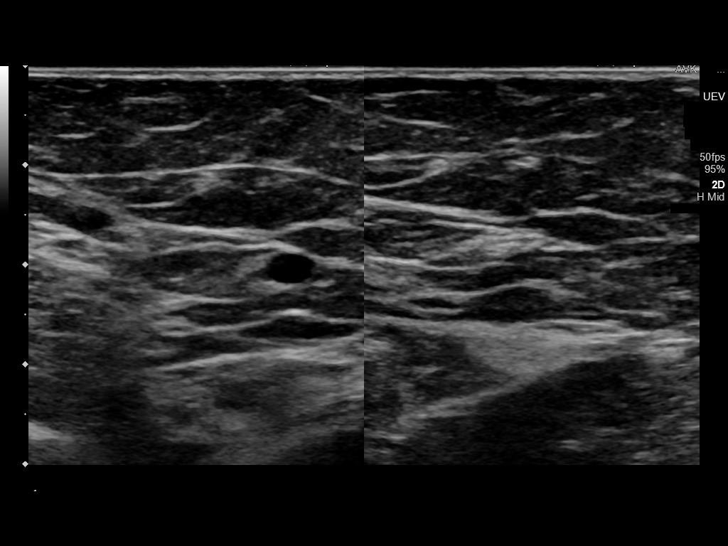
[im 23/36]
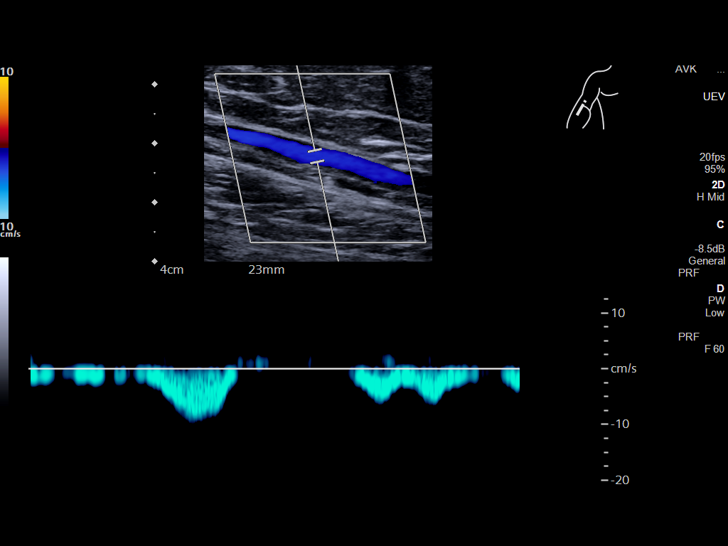
[im 26/36]
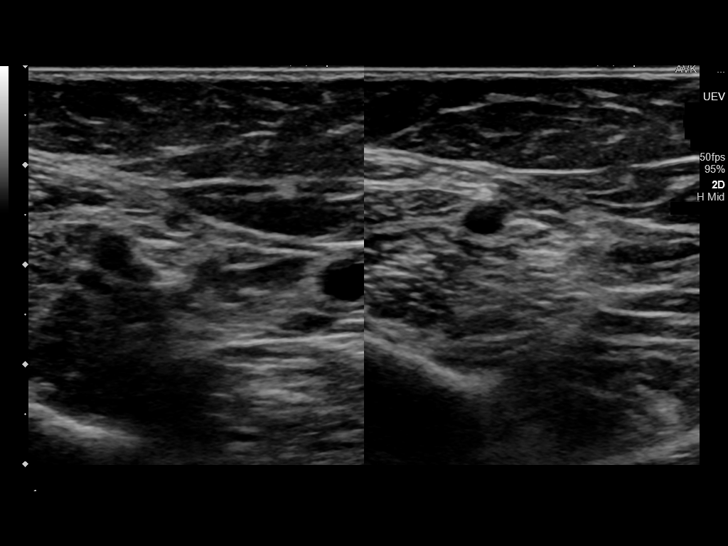
[im 29/36]
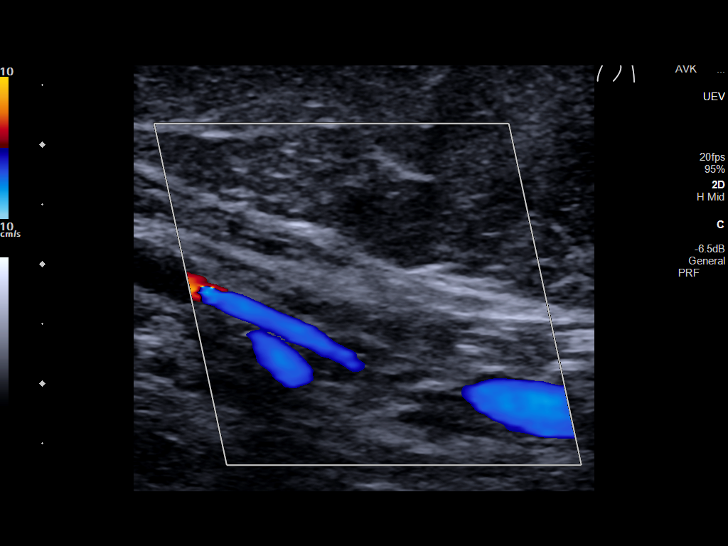
[im 32/36]
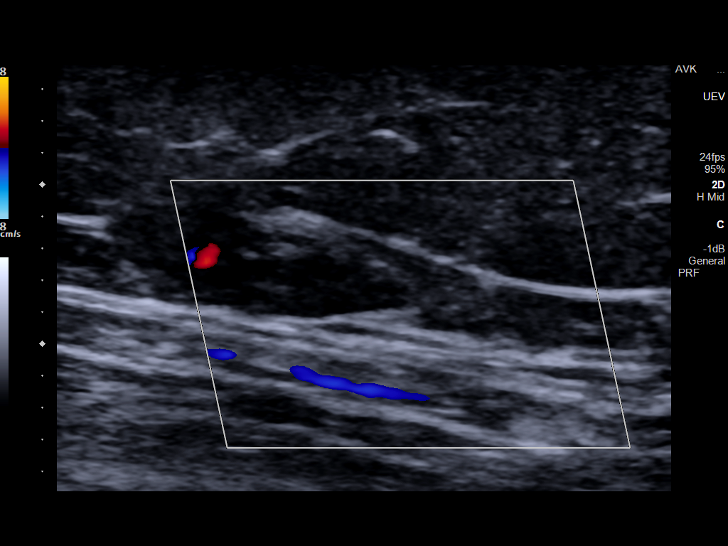
[im 36/36]
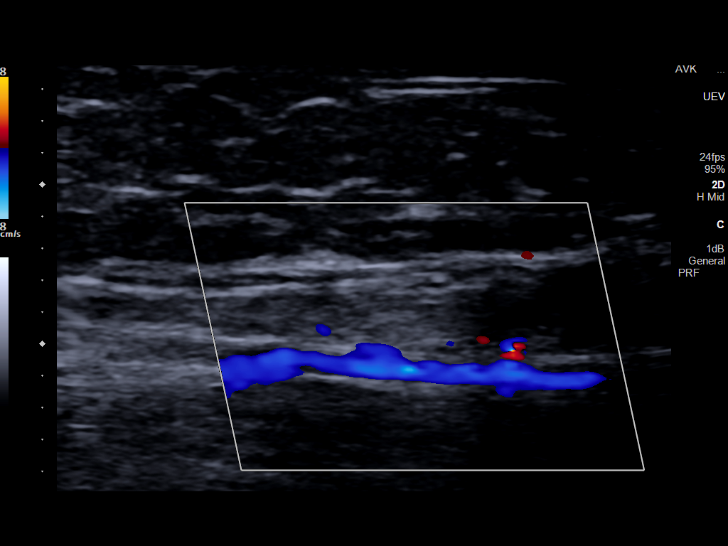

[13 of 24 positions shown; findings below may reference images not displayed]

FINDINGS: Contralateral Subclavian Vein: Respiratory phasicity is normal and
symmetric with the symptomatic side. No evidence of thrombus. Normal
compressibility.

Internal Jugular Vein: No evidence of thrombus. Normal
compressibility, respiratory phasicity and response to augmentation.

Subclavian Vein: No evidence of thrombus. Normal compressibility,
respiratory phasicity and response to augmentation.

Axillary Vein: No evidence of thrombus. Normal compressibility,
respiratory phasicity and response to augmentation.

Cephalic Vein: No evidence of thrombus. Normal compressibility,
respiratory phasicity and response to augmentation.

Basilic Vein: No evidence of thrombus. Normal compressibility,
respiratory phasicity and response to augmentation.

Brachial Veins: No evidence of thrombus. Normal compressibility,
respiratory phasicity and response to augmentation.

Radial Veins: No evidence of thrombus. Normal compressibility,
respiratory phasicity and response to augmentation.

Ulnar Veins: No evidence of thrombus. Normal compressibility,
respiratory phasicity and response to augmentation.

Venous Reflux:  None visualized.

Other Findings:  None visualized.
IMPRESSION: No evidence of DVT within the right upper extremity.

## 2021-05-22 ENCOUNTER — Ambulatory Visit (HOSPITAL_COMMUNITY)
Admission: RE | Admit: 2021-05-22 | Discharge: 2021-05-22 | Disposition: A | Discharge status: Home / Self Care | Payer: Medicare Other | Source: Ambulatory Visit | Attending: Family Medicine | Admitting: Family Medicine

## 2021-05-22 DIAGNOSIS — Z1231 Encounter for screening mammogram for malignant neoplasm of breast: Secondary | ICD-10-CM | POA: Diagnosis present

## 2021-05-22 IMAGING — MG MM DIGITAL SCREENING BILAT W/ TOMO AND CAD
6 of 12 series · 6 of 36 positions shown · non-contrast
Comparison: Previous exam(s).

CLINICAL DATA: Screening.

EXAM:
DIGITAL SCREENING BILATERAL MAMMOGRAM WITH TOMOSYNTHESIS AND CAD
TECHNIQUE: Bilateral screening digital craniocaudal and mediolateral oblique
mammograms were obtained. Bilateral screening digital breast
tomosynthesis was performed. The images were evaluated with
computer-aided detection.

[R CC synth-2D (1 of 2)]
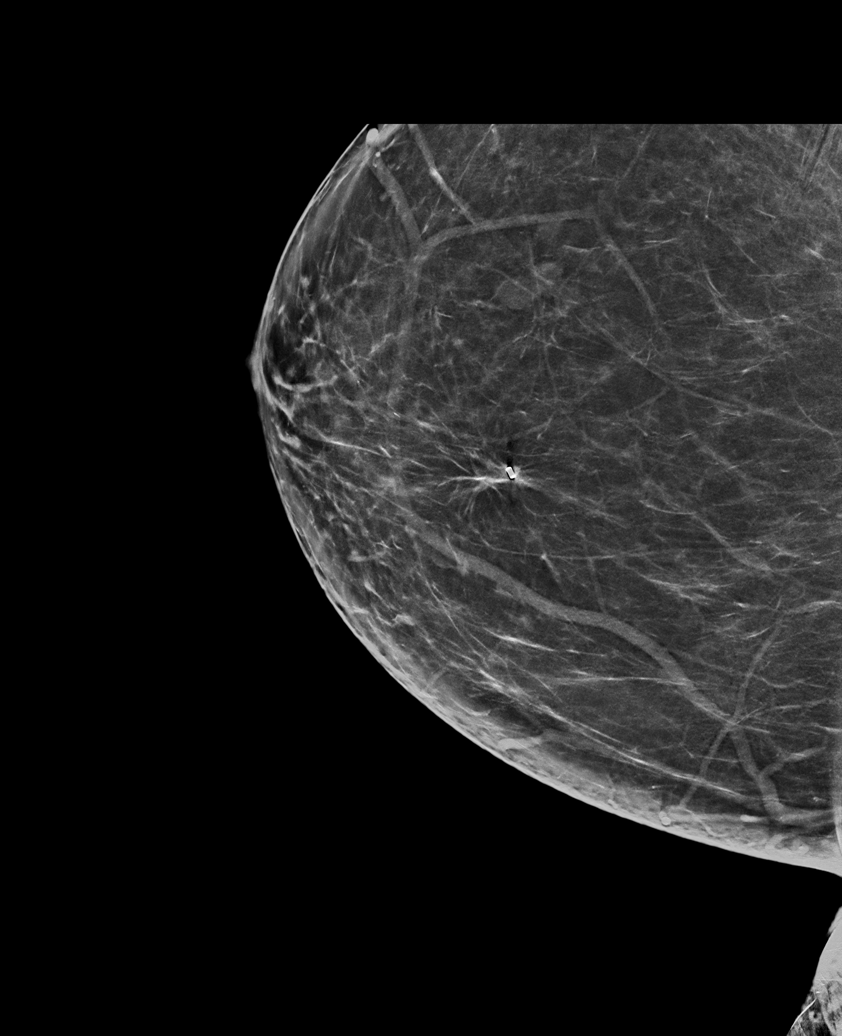

[L CC synth-2D]
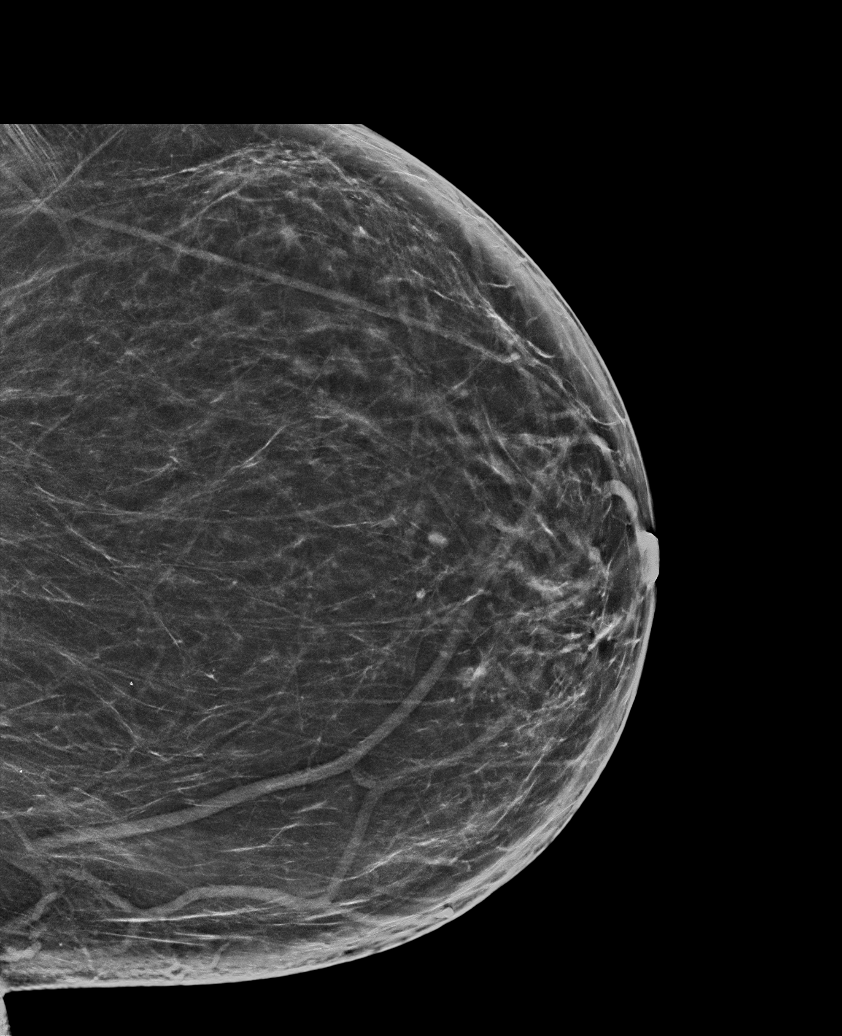

[L MLO synth-2D (1 of 2)]
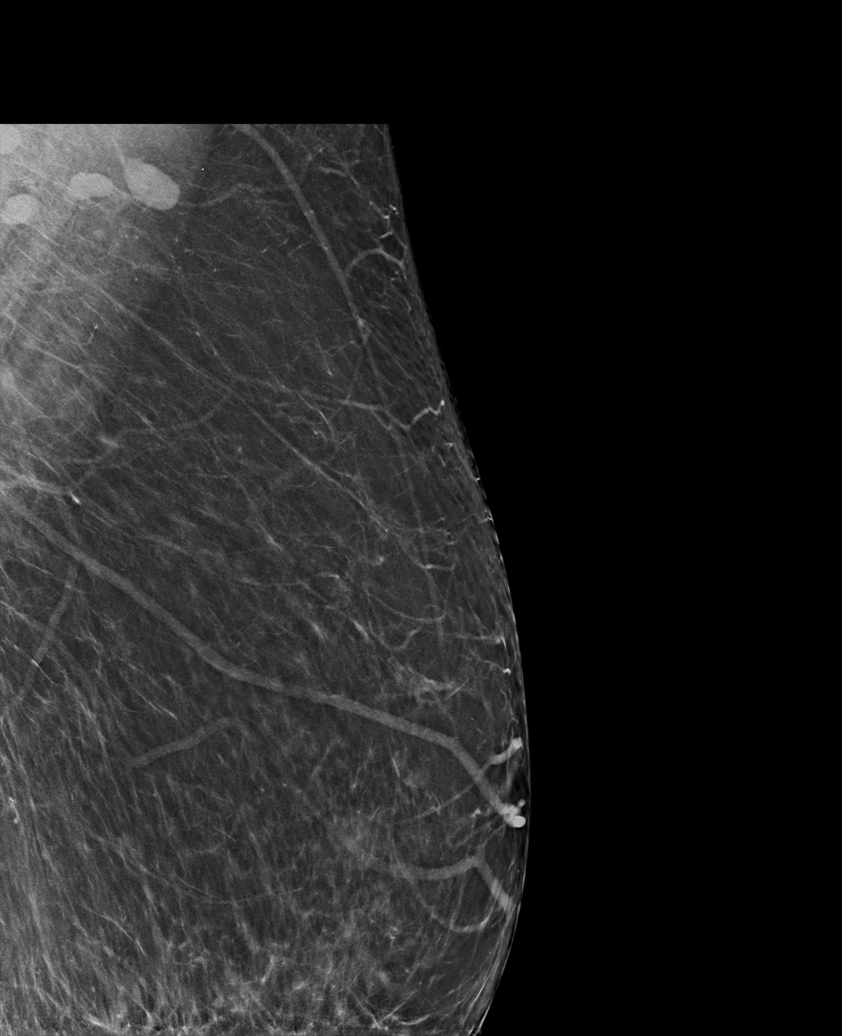

[L MLO synth-2D (2 of 2)]
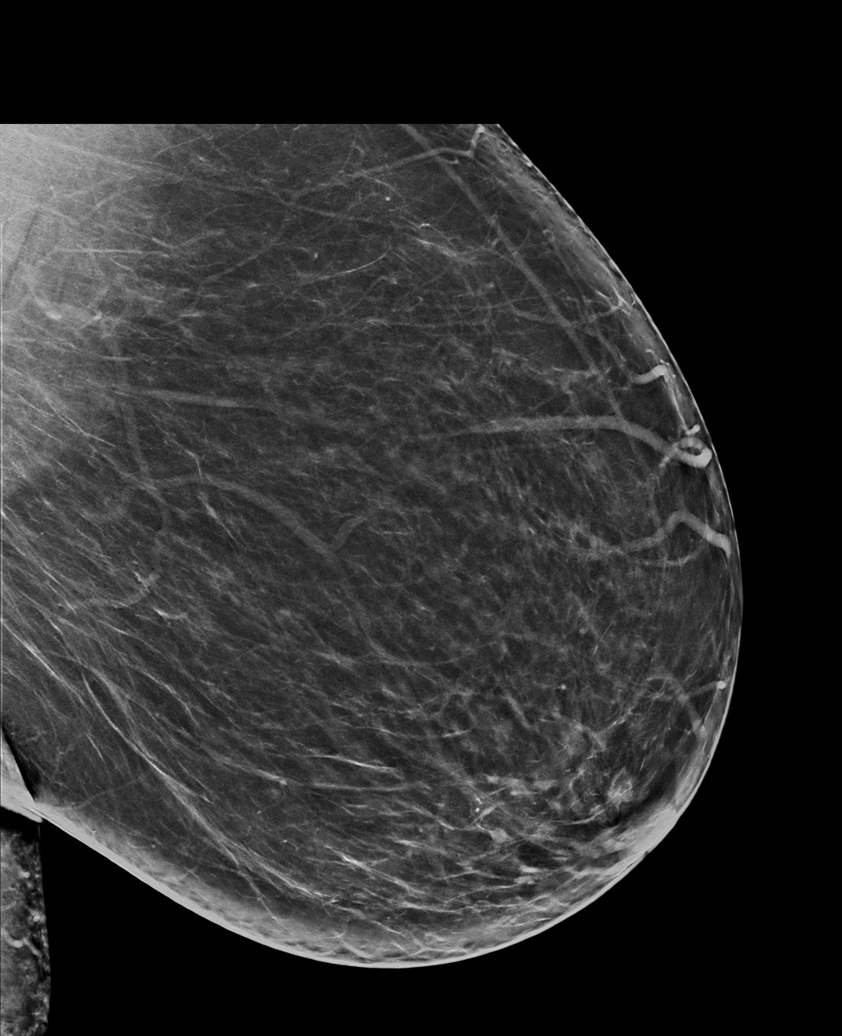

[R MLO synth-2D]
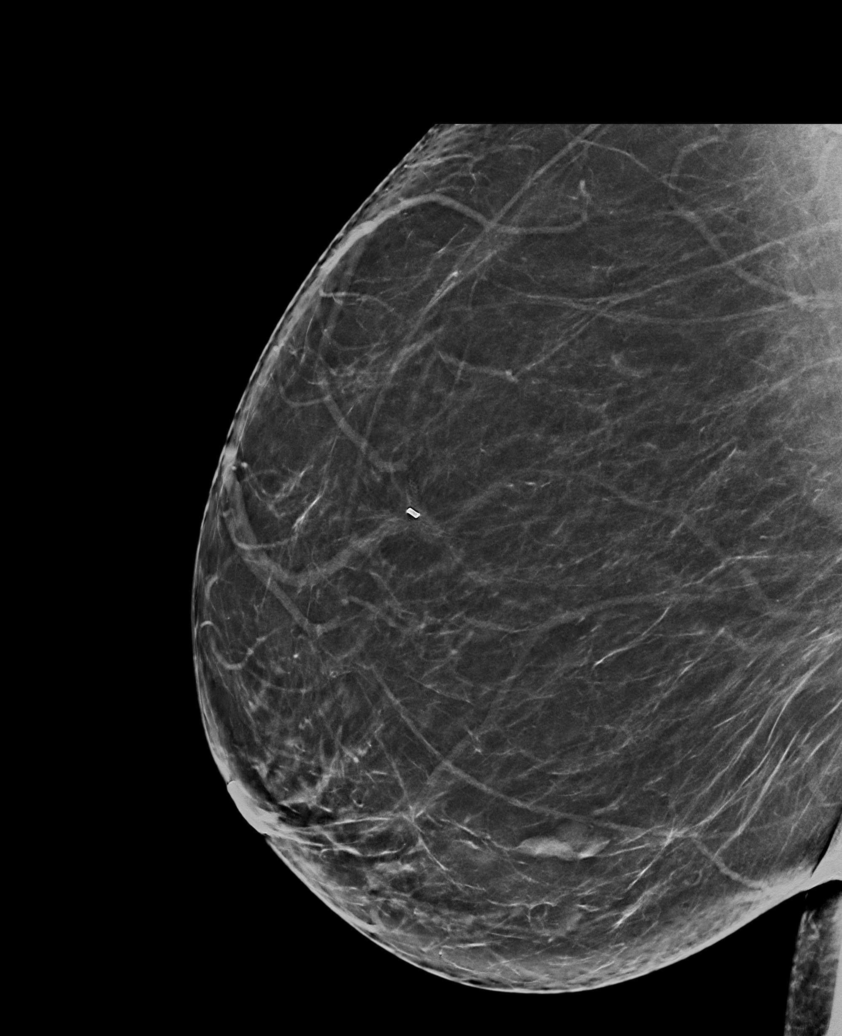

[R CC synth-2D (2 of 2)]
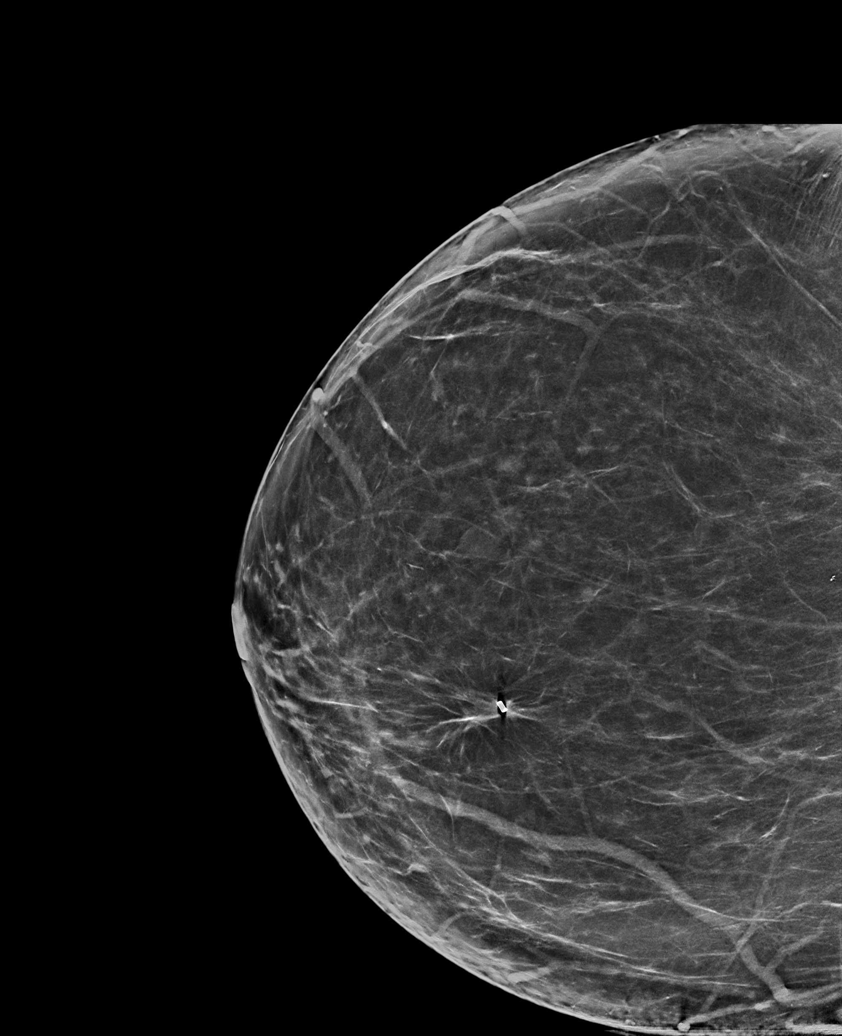

[6 of 36 positions shown; findings below may reference images not displayed]

ACR Breast Density Category b: There are scattered areas of
fibroglandular density.
FINDINGS: There are no findings suspicious for malignancy.
IMPRESSION: No mammographic evidence of malignancy. A result letter of this
screening mammogram will be mailed directly to the patient.

RECOMMENDATION:
Screening mammogram in one year. (Code:[BY])

BI-RADS CATEGORY  1: Negative.

## 2021-06-13 ENCOUNTER — Other Ambulatory Visit: Payer: Self-pay | Admitting: Physical Medicine & Rehabilitation

## 2021-06-13 DIAGNOSIS — M542 Cervicalgia: Secondary | ICD-10-CM

## 2021-06-22 ENCOUNTER — Ambulatory Visit
Admission: RE | Admit: 2021-06-22 | Discharge: 2021-06-22 | Disposition: A | Discharge status: Home / Self Care | Payer: Medicare Other | Source: Ambulatory Visit | Attending: Physical Medicine & Rehabilitation | Admitting: Physical Medicine & Rehabilitation

## 2021-06-22 DIAGNOSIS — M542 Cervicalgia: Secondary | ICD-10-CM | POA: Insufficient documentation

## 2021-06-22 IMAGING — MR MR CERVICAL SPINE W/O CM
5 series · 39 of 48 positions shown · non-contrast
Comparison: [DATE]

CLINICAL DATA: Right shoulder surgery, right arm numbness, history
of prior cervical surgery

EXAM:
MRI CERVICAL SPINE WITHOUT CONTRAST
TECHNIQUE: Multiplanar, multisequence MR imaging of the cervical spine was
performed. No intravenous contrast was administered.

[Series 5: T2 · sagittal · 3.0mm · 0.62mm/px · 6 of 15 slices shown (1 of 2)]
[im 1/15]
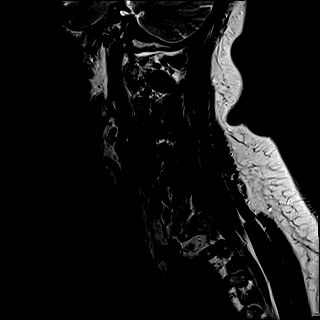
[im 3/15]
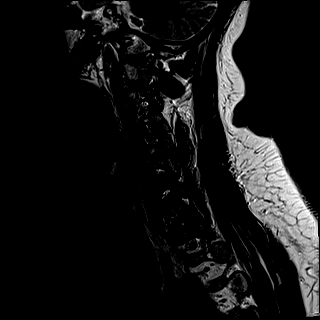
[im 6/15]
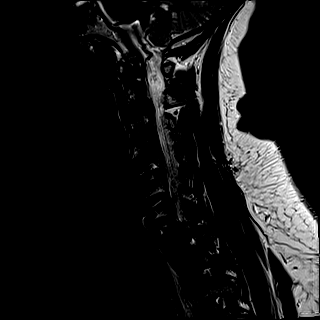
[im 9/15]
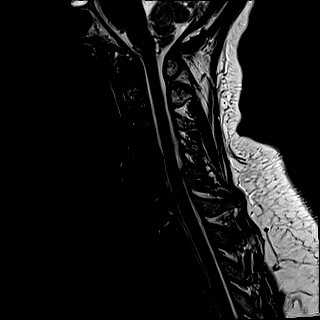
[im 12/15]
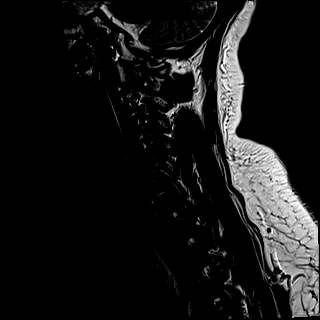
[im 15/15]
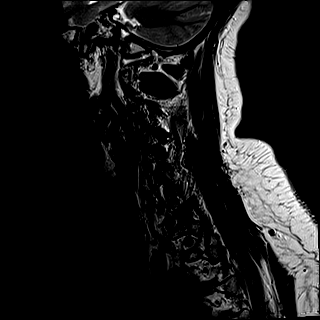

[Series 6: FLAIR · sagittal · 3.0mm · 0.78mm/px · 7 of 15 slices shown]
[im 1/15]
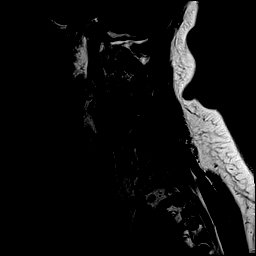
[im 3/15]
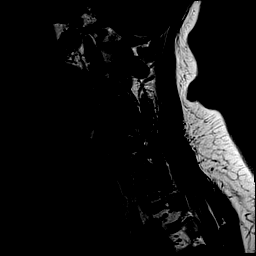
[im 5/15]
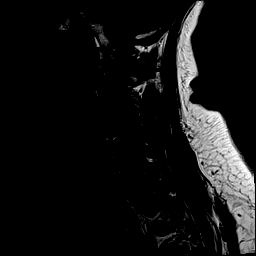
[im 8/15]
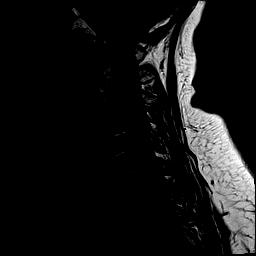
[im 10/15]
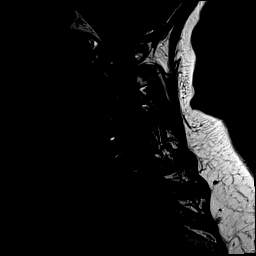
[im 12/15]
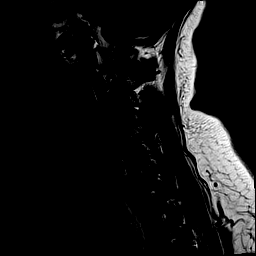
[im 15/15]
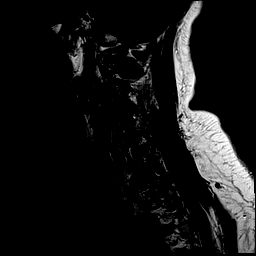

[Series 7: STIR · sagittal · 3.0mm · 0.62mm/px · 7 of 15 slices shown]
[im 1/15]
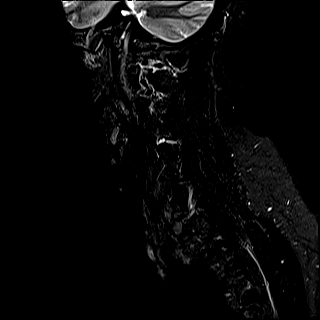
[im 3/15]
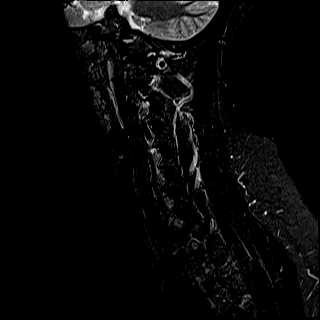
[im 5/15]
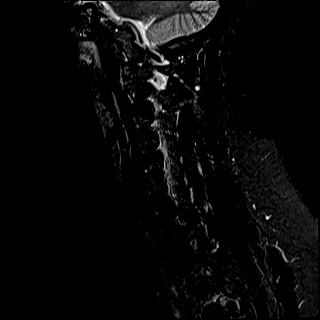
[im 8/15]
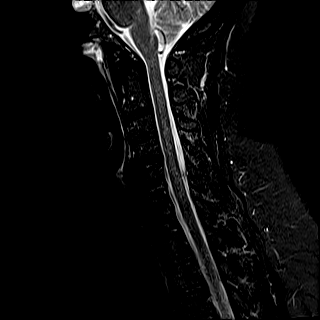
[im 10/15]
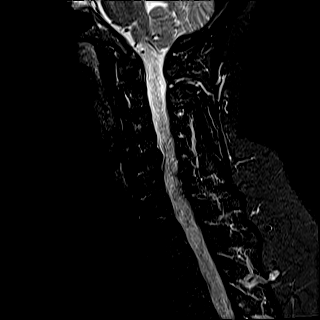
[im 12/15]
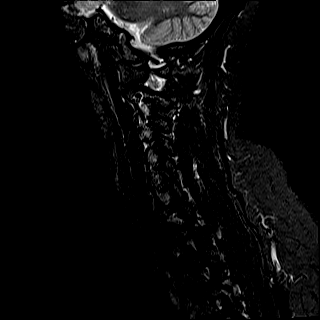
[im 15/15]
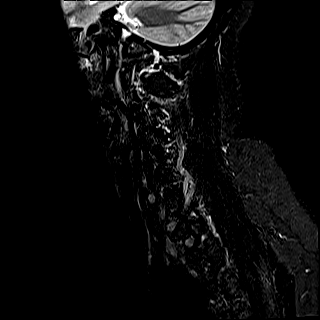

[Series 8: T2 · axial · 3.0mm · 0.70mm/px · z∈[-103,-9]mm · 11 of 29 slices shown (2 of 2)]
[im 1/29]
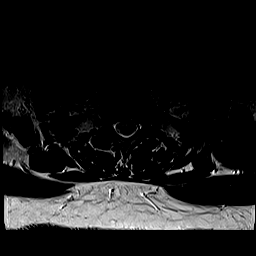
[im 3/29]
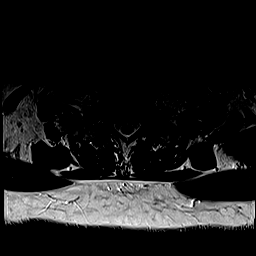
[im 5/29]
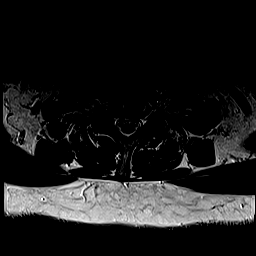
[im 7/29]
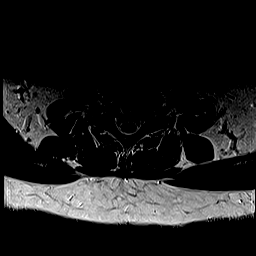
[im 9/29]
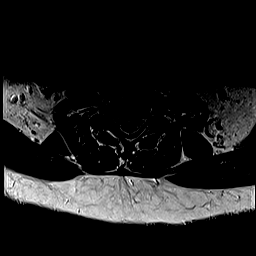
[im 11/29]
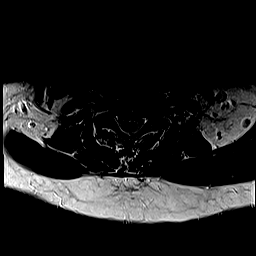
[im 13/29]
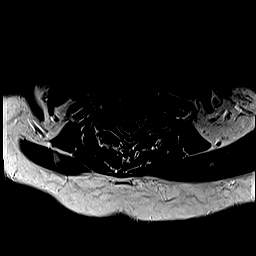
[im 16/29]
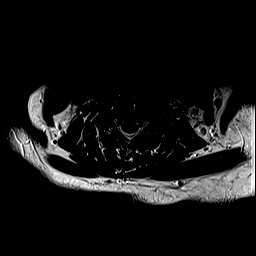
[im 20/29]
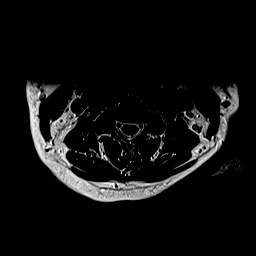
[im 24/29]
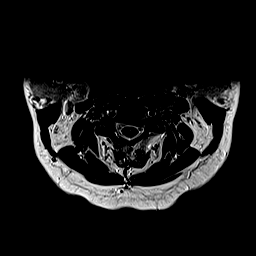
[im 29/29]
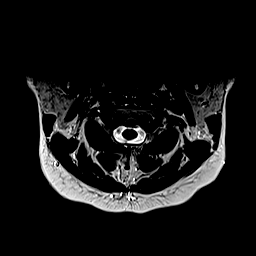

[Series 9: ax mpgr · axial · 3.0mm · 0.35mm/px · z∈[-103,-9]mm · 8 of 29 slices shown]
[im 1/29]
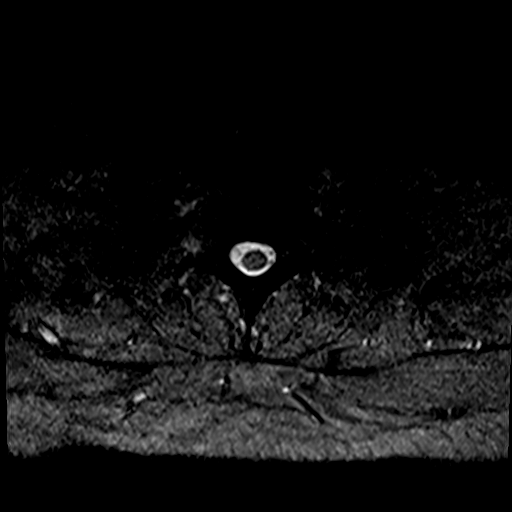
[im 5/29]
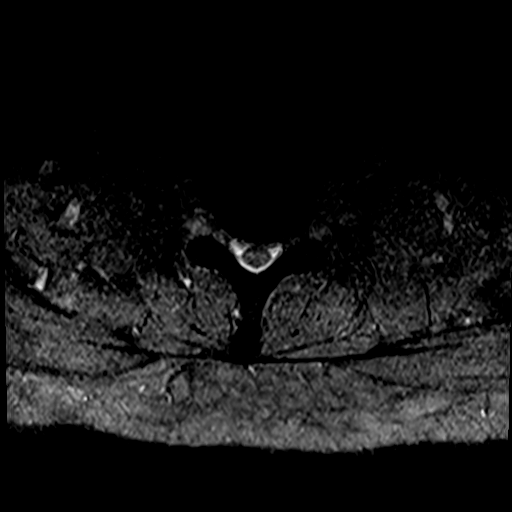
[im 9/29]
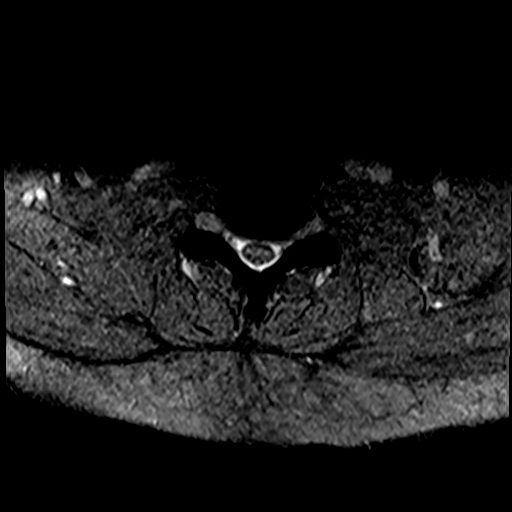
[im 13/29]
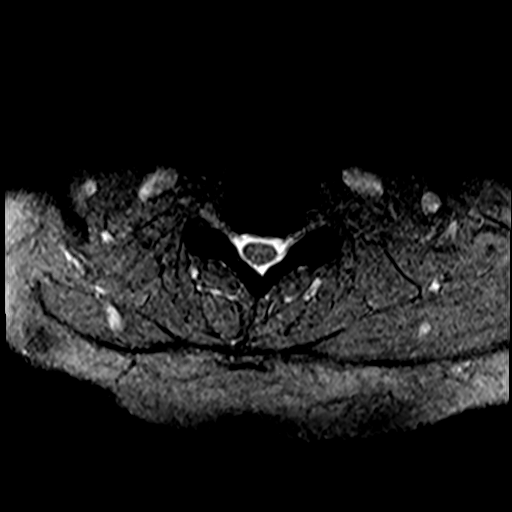
[im 16/29]
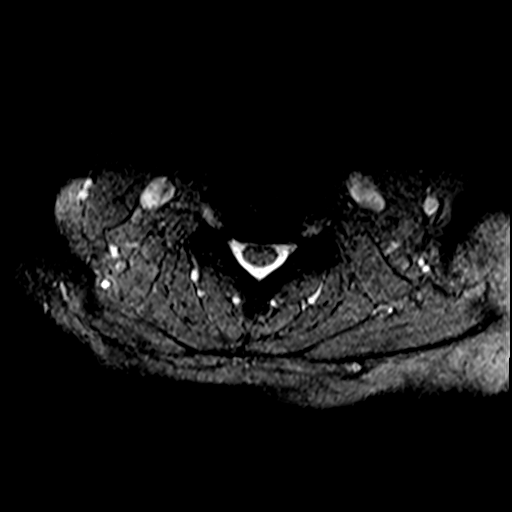
[im 20/29]
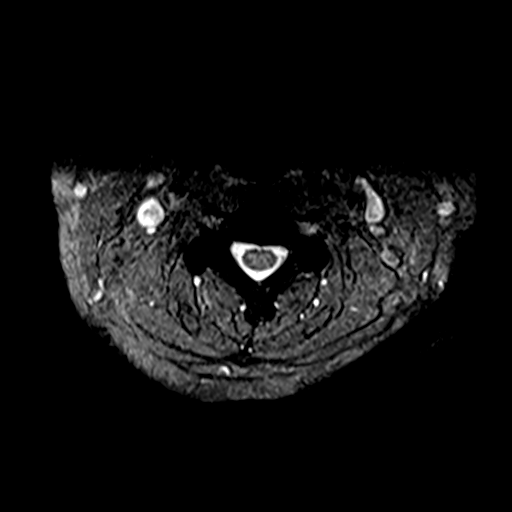
[im 24/29]
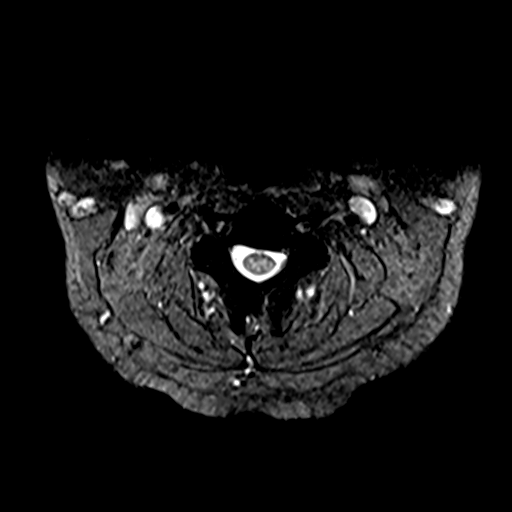
[im 29/29]
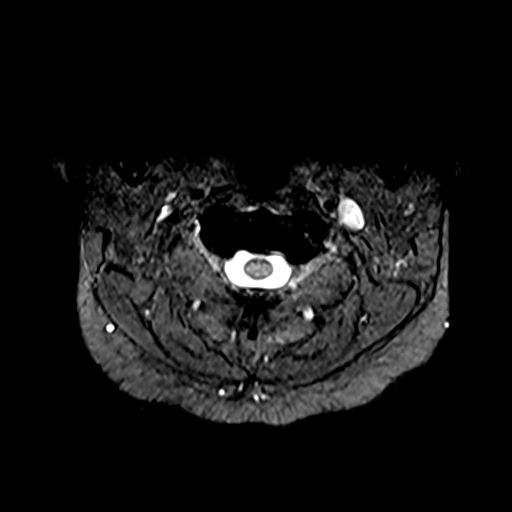

[39 of 48 positions shown; findings below may reference images not displayed]

FINDINGS: Alignment: 2 mm retrolisthesis of C4 on C5.

Vertebrae: No acute fracture, evidence of discitis, or aggressive
bone lesion.

Cord: Normal signal and morphology.

Posterior Fossa, vertebral arteries, paraspinal tissues: Posterior
fossa demonstrates no focal abnormality. Vertebral artery flow voids
are maintained. Paraspinal soft tissues are unremarkable.

Disc levels:

Discs: Anterior cervical fusion from C5 through C7. Degenerative
disease with disc height loss at C4-5.

C2-3: No disc protrusion. Moderate left foraminal stenosis. No right
foraminal stenosis. No spinal stenosis.

C3-4: No significant disc bulge. Mild bilateral facet arthropathy.
No foraminal stenosis. No spinal stenosis.

C4-5: Mild broad-based disc bulge. Right uncovertebral degenerative
changes. Moderate-severe right foraminal stenosis. Mild left
foraminal stenosis. No spinal stenosis.

C5-6: Interbody fusion. Mild bilateral foraminal narrowing. No
spinal stenosis.

C6-7: Interbody fusion.  No foraminal or central canal stenosis.

C7-T1: No significant disc bulge. No neural foraminal stenosis. No
central canal stenosis.
IMPRESSION: 1. Anterior cervical fusion from C5 through C7 with mild bilateral
foraminal narrowing at C5-6.
2. At C4-5 there is a mild broad-based disc bulge. Right
uncovertebral degenerative changes. Moderate-severe right foraminal
stenosis. Mild left foraminal stenosis. There has been interval
progression compared with [DATE].

## 2021-08-21 NOTE — Therapy (Signed)
OUTPATIENT PHYSICAL THERAPY VESTIBULAR EVALUATION   Patient Name: Kara Jimenez MRN: [id number] DOB:02/16/1960, 61 y.o., female Today's Date: 08/22/2021  PCP: Danna Hefty, DO REFERRING PROVIDER: Danna Hefty, DO   PT End of Session - 08/22/21 1118     Visit Number 1    Number of Visits 9    Date for PT Re-Evaluation 10/17/21    Authorization Type eval: 08/22/21    PT Start Time 1145    PT Stop Time 1230    PT Time Calculation (min) 45 min    Activity Tolerance Patient tolerated treatment well    Behavior During Therapy WFL for tasks assessed/performed             Past Medical History:  Diagnosis Date   Anemia    Arthritis    Breast injury    Carpal tunnel syndrome    Complication of anesthesia    pt. states that her HR dropped to 20s when waking up after apppendectomy   Fibromyalgia    GERD (gastroesophageal reflux disease)    Headache    Heart murmur    Hypertension    Sciatica    Sleep apnea    Past Surgical History:  Procedure Laterality Date   BIOPSY  09/09/2020   Procedure: BIOPSY;  Surgeon: Daneil Dolin, MD;  Location: [facility];  Service: Endoscopy;;   BREAST EXCISIONAL BIOPSY Right    benign   CARPAL TUNNEL RELEASE Bilateral    CESAREAN SECTION     COLONOSCOPY WITH PROPOFOL N/A 09/09/2020   Procedure: COLONOSCOPY WITH PROPOFOL;  Surgeon: Daneil Dolin, MD;  Location: [facility];  Service: Endoscopy;  Laterality: N/A;  12:30pm   ESOPHAGOGASTRODUODENOSCOPY (EGD) WITH PROPOFOL N/A 09/09/2020   Procedure: ESOPHAGOGASTRODUODENOSCOPY (EGD) WITH PROPOFOL;  Surgeon: Daneil Dolin, MD;  Location: [facility];  Service: Endoscopy;  Laterality: N/A;   LAPAROSCOPIC APPENDECTOMY N/A 11/05/2019   Procedure: APPENDECTOMY LAPAROSCOPIC;  Surgeon: Virl Cagey, MD;  Location: [facility];  Service: General;  Laterality: N/A;   MALONEY DILATION N/A 09/09/2020   Procedure: Keturah Shavers;  Surgeon: Daneil Dolin, MD;  Location: [facility];  Service: Endoscopy;  Laterality: N/A;   NECK SURGERY     PARTIAL HYSTERECTOMY     POLYPECTOMY  09/09/2020   Procedure: POLYPECTOMY INTESTINAL;  Surgeon: Daneil Dolin, MD;  Location: [facility];  Service: Endoscopy;;   ROTATOR CUFF REPAIR Left    TUBAL LIGATION     Patient Active Problem List   Diagnosis Date Noted   Abdominal pain 05/04/2020   Constipation 05/04/2020   Rectal bleeding 05/04/2020   Dysphagia 05/04/2020   Intraductal papillary mucinous neoplasm of pancreas 12/02/2019   OSA on CPAP 11/16/2019   CPAP use counseling 11/16/2019   Class 2 obesity 11/06/2019   Bradycardia    Acute appendicitis 11/05/2019   Pancreatic lesion 11/05/2019     PCP: Danna Hefty, DO  REFERRING PROVIDER: Danna Hefty, DO  REFERRING DIAGNOSIS: Dizziness and giddiness  THERAPY DIAG: Dizziness and giddiness  RATIONALE FOR EVALUATION AND TREATMENT: Rehabilitation  ONSET DATE: 08/22/2016 (approximate)  FOLLOW UP APPT WITH PROVIDER: Yes    SUBJECTIVE:   Chief Complaint:  Dizziness  Pertinent History Pt reports she started to experience dizziness approximately 5 years ago. No known trigger at onset and pt denies any head trauma/concussion. The symptoms remained unchanged over the last 5 years however she had an episode of acute dizziness on the weekend  of July 4th during which she fell. No LOC and no head trauma during fall. She reports that her knees felt weak and she continues to have spells of intermittent leg weakness. She reports that her dizziness symptoms have worsened since that time. She previously tried vestibular therapy last fall and states that it did not help. She reports that she has continued the exercises intermittently without any benefit. 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 She reports a history of 1 migraine which is well managed. She denies ENT consult or history of VNG study.  Imaging: 03/25/21: MRI face with and without contrast with trigeminal nerve protocol: normal   06/22/20: MRI Cervical Spine without Contrast: 1. Anterior cervical fusion from C5 through C7 without foraminal or central canal stenosis. 2. At C4-5 there is a mild broad-based disc bulge. Right uncovertebral degenerative changes. Mild right foraminal stenosis.   06/27/2020: MRI HEAD FINDINGS   Brain: No acute infarction, hemorrhage, hydrocephalus, extra-axial collection or mass lesion. The brain parenchyma has normal morphology and signal characteristics. No focus of abnormal contrast enhancement. Vascular: Normal flow voids. Skull and upper cervical spine: Normal marrow signal. Sinuses/Orbits: No acute or significant finding. Other: None.   06/27/2020: MRA HEAD FINDINGS   Anterior circulation: The visualized portions of the distal cervical and intracranial internal carotid arteries are widely patent with normal flow related enhancement. The bilateral anterior cerebral arteries and middle cerebral arteries are widely patent with antegrade flow without high-grade flow-limiting stenosis or proximal branch occlusion. No intracranial aneurysm within the anterior circulation.   Posterior circulation: The vertebral arteries are widely patent with antegrade flow. Vertebrobasilar junction and basilar artery are widely patent with antegrade flow without evidence of basilar stenosis or aneurysm. Posterior cerebral arteries are normal bilaterally. No intracranial aneurysm within the posterior circulation.   Anatomic variants: Hypoplastic right P1 segment with a prominent right  posterior communicating artery (fetal PCA).   IMPRESSION: 1. Unremarkable MRI of the brain. 2. Remarkable MR angiogram the brain.    Description of dizziness: unsteadiness and lightheadedness Frequency: Daily dizziness, 2-3x/wk pt feels like she is going to "pass out" Duration: 1-2 minutes Symptom nature: motion provoked and spontaneous  Progression of symptoms since onset: worse History of similar episodes: No  Provocative Factors: lines on the road with driving, happens spontaneously, spending time outside in the heat make symptoms worse Easing Factors: waiting for symptoms to pass  Auditory complaints (tinnitus, pain, drainage, hearing loss, aural fullness): Yes, tinnitus and aural fullness on L side. She reports having a hearing test and pt reports it was WNL; Vision changes (diplopia, visual field loss, recent changes, recent eye exam): Yes double vision occasionally, slight blurry vision. Wears glasses for distance vision, vision check in July without any changes in prescription. History of glaucoma managed with eye drops; Chest pain/palpitations: Yes, pt reports chest palpitations during episodes. She also reports DOE. She sees cardiology 08/31/21. She reports halter monitor in 2021 and was told she "had an extra heart beat."  History of head injury/concussion: No Stress/anxiety: Yes, pt reports high stress related to multiple health concerns; Migraines: Pt reports one migraine for which she  Numbness/tingling: hands (carpal tunnel), feet,  R face Focal weakness: denies  Has patient fallen in last 6 months? Yes, Number of falls: 1 Pertinent pain: Yes, "pinched nerve" in the neck with chronic pain; Dominant hand: right Imaging: Yes, see history  Prior level of function: Independent Occupational demands: On disability Hobbies: "I don't do too much."   Red Flags: Denies: dysarthria, dysphagia, bowel and bladder changes, recent weight loss/gain, chills, fever. Positive: night  sweats   PRECAUTIONS: None  WEIGHT BEARING RESTRICTIONS No  LIVING ENVIRONMENT: Lives with: lives with their family Lives in: House/apartment Stairs: Yes; External: y steps; on right going up, on left going up, and can reach both Has following equipment at home: shower chair  PATIENT GOALS: Decrease symptoms;   OBJECTIVE  EXAMINATION  POSTURE: No gross deficits contributing to symptoms  NEUROLOGICAL SCREEN: (2+ unless otherwise noted.) N=normal  Ab=abnormal  Level Dermatome R L Myotome R L Reflex R L  C3 Anterior Neck Ab N Sidebend C2-3 N N Jaw CN V    C4 Top of Shoulder Ab N Shoulder Shrug C4 N N Hoffman's UMN    C5 Lateral Upper Arm Ab N Shoulder ABD C4-5 N N Biceps C5-6    C6 Lateral Arm/ Thumb Ab N Arm Flex/ Wrist Ext C5-6 N N Brachiorad. C5-6    C7 Middle Finger Ab N Arm Ext//Wrist Flex C6-7 N N Triceps C7    C8 4th & 5th Finger Ab N Flex/ Ext Carpi Ulnaris C8 N N Patellar (L3-4)    T1 Medial Arm Ab N Interossei T1 N N Gastrocnemius    L2 Medial thigh/groin Ab N Illiopsoas (L2-3) N N     L3 Lower thigh/med.knee Ab N Quadriceps (L3-4) N N     L4 Medial leg/lat thigh Ab N Tibialis Ant (L4-5) N N     L5 Lat. leg & dorsal foot Ab N EHL (L5) N N     S1 post/lat foot/thigh/leg Ab N Gastrocnemius (S1-2) N N     S2 Post./med. thigh & leg Ab N Hamstrings (L4-S3) N N       CRANIAL NERVES II, III, IV, VI: Pupils equal and reactive to light, visual acuity and visual fields are intact, extraocular muscles are intact  V: Facial sensation is intact and symmetric bilaterally  VII: Facial strength is intact and symmetric bilaterally  VIII: Hearing is normal as tested by gross conversation IX, X: Palate elevates midline, normal phonation, uvula midline XI: Shoulder shrug strength is intact  XII: Tongue protrudes midline    SOMATOSENSORY Grossly intact to light touch in LUE/LLE as determined by testing dermatomes C2-T2 and L2-S2. All sensation testing on the right sided is  reported as diminished in face, RUE, and RLE. Proprioception and hot/cold testing deferred on this date.   COORDINATION Finger to Nose: Normal Heel to Shin: Normal Pronator Drift: Negative Rapid Alternating Movements: Normal Finger to Thumb Opposition: Normal    RANGE OF MOTION Cervical Spine AROM mildly limited but painless in all directions. No focal deficits in AROM noted in BUE/BLE with the exception of some limitation in R shoulder flexion and abduction s/p R shoulder RTC repair in January 2023;   MANUAL MUSCLE TESTING BUE/BLE strength WNL without focal deficits with the exception of some weakness in R shoulder flexion and abduction s/p R RTC repair in January 2023   TRANSFERS/GAIT Independent for transfers and ambulation without assistive device    PATIENT SURVEYS FOTO: 47, predicted improvement to 53 ABC: 66.9% DHI: 44/100   POSTURAL CONTROL TESTS  Clinical Test of Sensory Interaction for Balance (CTSIB): Deferred  OCULOMOTOR / VESTIBULAR TESTING  Oculomotor Exam- Room Light  Findings Comments  Ocular Alignment normal   Ocular ROM normal   Spontaneous Nystagmus normal   Gaze-Holding Nystagmus normal   End-Gaze Nystagmus normal   Vergence (normal 2-3") not examined   Smooth Pursuit normal   Cross-Cover Test not examined   Saccades normal   VOR Cancellation normal   Left Head Impulse normal   Right Head Impulse normal   Static Acuity not examined   Dynamic Acuity not examined     Oculomotor Exam- Fixation Suppressed  Findings Comments  Ocular Alignment normal   Spontaneous Nystagmus normal   Gaze-Holding Nystagmus normal   End-Gaze Nystagmus normal   Head Shaking Nystagmus normal   Pressure-Induced Nystagmus not examined   Hyperventilation Induced Nystagmus not examined   Skull Vibration Induced Nystagmus Attempted but pt unable to tolerate Pt unable to tolerate vibration on mastoid due to chronic pain    BPPV TESTS:  Symptoms Duration Intensity  Nystagmus  L Dix-Hallpike None   None  R Dix-Hallpike None   None  L Head Roll None   None  R Head Roll None   None  L Sidelying Test      R Sidelying Test      (blank = not tested)   FUNCTIONAL OUTCOME MEASURES   Results Comments  BERG    DGI    FGA    TUG    5TSTS    6 Minute Walk Test    10 Meter Gait Speed    (blank = not tested)   TODAY'S TREATMENT  None    ASSESSMENT:  CLINICAL IMPRESSION: Patient is a 61 y.o. female  who was seen today for physical therapy evaluation and treatment for dizzness. Objective impairments include dizziness. Only significant finding during examination is pt reported decreased sensation throughout entire R face, RUE, and RLE with testing when compared to the L side. Coordination and strength testing are grossly symmetrical with the exception of some R shoulder weakness in a pt who is s/p R shoulder surgery in January 2023. Oculomotor/vestibular testing with and without fixation suppression is normal today. History is concerning for possible cardiogenic or central neurological source for symptoms and pt reports that she has an upcoming appointment to see cardiology. She reports increase in symptoms with optokinetic stripes when driving and would likely benefit from a referral to ENT for VNG testing to definitely rule in/out vestibular source for symptoms. Additional balance/vestibular testing will be performed at first follow-up appointment. These impairments are limiting patient from meal prep, cleaning, laundry, driving, shopping, and community activity. Personal factors including Age, Past/current experiences, Time since onset of injury/illness/exacerbation, and 3+ comorbidities: OSA, cervical fusion, lumbar radiculopathy, bradycardia, and fibromyalgia,   are also affecting patient's functional outcome. Patient will benefit from skilled PT to address above impairments and improve overall function.  REHAB POTENTIAL: Fair  CLINICAL DECISION MAKING:  Unstable/unpredictable  EVALUATION COMPLEXITY: High   GOALS: Goals reviewed with patient? No  SHORT TERM GOALS: Target date: 09/19/2021  Pt will be independent with HEP for dizziness in order to decrease symptoms, improve balance,decrease fall risk, and improve function at home. Baseline: Goal status: INITIAL   LONG TERM GOALS: Target date: 10/17/2021  Pt will increase FOTO to at least 53 to demonstrate significant improvement in function at home related to dizziness.  Baseline: 08/22/21: 47 Goal status: INITIAL  2.  Pt will decrease DHI score by  at least 18 points in order to demonstrate clinically significant reduction in disability related to dizziness.  Baseline: 08/22/21: 44/100 Goal status: INITIAL  3.  Pt will improve ABC by at least 13% in order to demonstrate clinically significant improvement in balance confidence.      Baseline: 08/22/21: 66.9% Goal status: INITIAL  4. Pt will improve FGA by at least 4 points in order to demonstrate clinically significant improvement in balance and decreased risk for falls.     Baseline: 08/22/21: To be completed Goal status: INITIAL    PLAN:  PT FREQUENCY: 1x/week  PT DURATION: 8 weeks  PLANNED INTERVENTIONS: Therapeutic exercises, Therapeutic activity, Neuromuscular re-education, Balance training, Gait training, Patient/Family education, Joint manipulation, Joint mobilization, Canalith repositioning, Aquatic Therapy, Dry Needling, Cognitive remediation, Electrical stimulation, Spinal manipulation, Spinal mobilization, Cryotherapy, Moist heat, Traction, Ultrasound, Ionotophoresis '4mg'$ /ml Dexamethasone, and Manual therapy  PLAN FOR NEXT SESSION: mCTSIB, DGI/FGA, BERG, referral to ENT, consider DVA if pt can tolerate, initiate HEP if indicated;   Lyndel Safe Yug Loria PT, DPT, GCS  Ameila Weldon 08/22/2021, 1:58 PM

## 2021-08-22 ENCOUNTER — Ambulatory Visit: Payer: Medicare Other | Attending: Family Medicine

## 2021-08-22 DIAGNOSIS — R42 Dizziness and giddiness: Secondary | ICD-10-CM | POA: Insufficient documentation

## 2021-08-25 NOTE — Therapy (Unsigned)
OUTPATIENT PHYSICAL THERAPY VESTIBULAR TREATMENT   Patient Name: Kara Jimenez MRN: [id number] DOB:[birth year], 61 y.o., female Today's Date: 08/29/2021  PCP: Danna Hefty, DO REFERRING PROVIDER: Danna Hefty, DO   PT End of Session - 08/28/21 1308     Visit Number 2    Number of Visits 9    Date for PT Re-Evaluation 10/17/21    Authorization Type eval: 08/22/21    PT Start Time 1315    PT Stop Time 1400    PT Time Calculation (min) 45 min    Activity Tolerance Patient tolerated treatment well    Behavior During Therapy WFL for tasks assessed/performed              Past Medical History:  Diagnosis Date   Anemia    Arthritis    Breast injury    Carpal tunnel syndrome    Complication of anesthesia    pt. states that her HR dropped to 20s when waking up after apppendectomy   Fibromyalgia    GERD (gastroesophageal reflux disease)    Headache    Heart murmur    Hypertension    Sciatica    Sleep apnea    Past Surgical History:  Procedure Laterality Date   BIOPSY  09/09/2020   Procedure: BIOPSY;  Surgeon: Daneil Dolin, MD;  Location: [facility];  Service: Endoscopy;;   BREAST EXCISIONAL BIOPSY Right    benign   CARPAL TUNNEL RELEASE Bilateral    CESAREAN SECTION     COLONOSCOPY WITH PROPOFOL N/A 09/09/2020   Procedure: COLONOSCOPY WITH PROPOFOL;  Surgeon: Daneil Dolin, MD;  Location: [facility];  Service: Endoscopy;  Laterality: N/A;  12:30pm   ESOPHAGOGASTRODUODENOSCOPY (EGD) WITH PROPOFOL N/A 09/09/2020   Procedure: ESOPHAGOGASTRODUODENOSCOPY (EGD) WITH PROPOFOL;  Surgeon: Daneil Dolin, MD;  Location: [facility];  Service: Endoscopy;  Laterality: N/A;   LAPAROSCOPIC APPENDECTOMY N/A 11/05/2019   Procedure: APPENDECTOMY LAPAROSCOPIC;  Surgeon: Virl Cagey, MD;  Location: [facility];  Service: General;  Laterality: N/A;   MALONEY DILATION N/A 09/09/2020   Procedure: Keturah Shavers;  Surgeon: Daneil Dolin, MD;  Location: [facility];  Service: Endoscopy;  Laterality: N/A;   NECK SURGERY     PARTIAL HYSTERECTOMY     POLYPECTOMY  09/09/2020   Procedure: POLYPECTOMY INTESTINAL;  Surgeon: Daneil Dolin, MD;  Location: [facility];  Service: Endoscopy;;   ROTATOR CUFF REPAIR Left    TUBAL LIGATION     Patient Active Problem List   Diagnosis Date Noted   Abdominal pain 05/04/2020   Constipation 05/04/2020   Rectal bleeding 05/04/2020   Dysphagia 05/04/2020   Intraductal papillary mucinous neoplasm of pancreas 12/02/2019   OSA on CPAP 11/16/2019   CPAP use counseling 11/16/2019   Class 2 obesity 11/06/2019   Bradycardia    Acute appendicitis 11/05/2019   Pancreatic lesion 11/05/2019     PCP: Danna Hefty, DO  REFERRING PROVIDER: Danna Hefty, DO  REFERRING DIAGNOSIS: Dizziness and giddiness  THERAPY DIAG: Dizziness and giddiness  RATIONALE FOR EVALUATION AND TREATMENT: Rehabilitation  ONSET DATE: 08/22/2016 (approximate)  FOLLOW UP APPT WITH PROVIDER: Yes    FROM INITIAL EVALUATION (08/22/21):  SUBJECTIVE:   Chief Complaint:  Dizziness  Pertinent History Pt reports she started to experience dizziness approximately 5 years ago. No known trigger at onset and pt denies any head trauma/concussion. The symptoms remained unchanged over the last 5 years however she had an episode  of acute dizziness on the weekend of July 4th during which she fell. No LOC and no head trauma during fall. She reports that her knees felt weak and she continues to have spells of intermittent leg weakness. She reports that her dizziness symptoms have worsened since that time. She previously tried vestibular therapy last fall and states that it did not help. She reports that she has continued the exercises intermittently without any benefit. 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 She reports a history of 1 migraine which is well managed. She denies ENT consult or history of VNG study.  Imaging: 03/25/21: MRI face with and without contrast with trigeminal nerve protocol: normal   06/22/20: MRI Cervical Spine without Contrast: 1. Anterior cervical fusion from C5 through C7 without foraminal or central canal stenosis. 2. At C4-5 there is a mild broad-based disc bulge. Right uncovertebral degenerative changes. Mild right foraminal stenosis.   06/27/2020: MRI HEAD FINDINGS   Brain: No acute infarction, hemorrhage, hydrocephalus, extra-axial collection or mass lesion. The brain parenchyma has normal morphology and signal characteristics. No focus of abnormal contrast enhancement. Vascular: Normal flow voids. Skull and upper cervical spine: Normal marrow signal. Sinuses/Orbits: No acute or significant finding. Other: None.   06/27/2020: MRA HEAD FINDINGS   Anterior circulation: The visualized portions of the distal cervical and intracranial internal carotid arteries are widely patent with normal flow related enhancement. The bilateral anterior cerebral arteries and middle cerebral arteries are widely patent with antegrade flow without high-grade flow-limiting stenosis or proximal branch occlusion. No intracranial aneurysm within the anterior circulation.   Posterior circulation: The vertebral arteries are widely patent with antegrade flow. Vertebrobasilar junction and basilar artery are widely patent with antegrade flow without evidence of basilar stenosis or aneurysm. Posterior cerebral arteries are normal bilaterally. No intracranial aneurysm within the posterior circulation.   Anatomic variants: Hypoplastic right  P1 segment with a prominent right posterior communicating artery (fetal PCA).   IMPRESSION: 1. Unremarkable MRI of the brain. 2. Remarkable MR angiogram the brain.    Description of dizziness: unsteadiness and lightheadedness Frequency: Daily dizziness, 2-3x/wk pt feels like she is going to "pass out" Duration: 1-2 minutes Symptom nature: motion provoked and spontaneous  Progression of symptoms since onset: worse History of similar episodes: No  Provocative Factors: lines on the road with driving, happens spontaneously, spending time outside in the heat make symptoms worse Easing Factors: waiting for symptoms to pass  Auditory complaints (tinnitus, pain, drainage, hearing loss, aural fullness): Yes, tinnitus and aural fullness on L side. She reports having a hearing test and pt reports it was WNL; Vision changes (diplopia, visual field loss, recent changes, recent eye exam): Yes double vision occasionally, slight blurry vision. Wears glasses for distance vision, vision check in July without any changes in prescription. History of glaucoma managed with eye drops; Chest pain/palpitations: Yes, pt reports chest palpitations during episodes. She also reports DOE. She sees cardiology 08/31/21. She reports halter monitor in 2021 and was told she "had an extra heart beat."  History of head injury/concussion: No Stress/anxiety: Yes, pt reports high stress related to multiple health concerns; Migraines: Pt reports one migraine for which she  Numbness/tingling: hands (carpal tunnel), feet, R face Focal weakness: denies  Has patient fallen in last 6 months? Yes, Number of falls: 1 Pertinent pain: Yes, "pinched nerve" in the neck with chronic pain; Dominant hand: right Imaging: Yes, see history  Prior level of function: Independent Occupational demands: On disability Hobbies: "I don't do too much."   Red Flags: Denies: dysarthria, dysphagia, bowel and bladder changes, recent weight loss/gain,  chills, fever. Positive: night sweats   PRECAUTIONS: None  WEIGHT BEARING RESTRICTIONS No  LIVING ENVIRONMENT: Lives with: lives with their family Lives in: House/apartment Stairs: Yes; External: y steps; on right going up, on left going up, and can reach both Has following equipment at home: shower chair  PATIENT GOALS: Decrease symptoms;   OBJECTIVE  EXAMINATION  POSTURE: No gross deficits contributing to symptoms  NEUROLOGICAL SCREEN: (2+ unless otherwise noted.) N=normal  Ab=abnormal  Level Dermatome R L Myotome R L Reflex R L  C3 Anterior Neck Ab N Sidebend C2-3 N N Jaw CN V    C4 Top of Shoulder Ab N Shoulder Shrug C4 N N Hoffman's UMN    C5 Lateral Upper Arm Ab N Shoulder ABD C4-5 N N Biceps C5-6    C6 Lateral Arm/ Thumb Ab N Arm Flex/ Wrist Ext C5-6 N N Brachiorad. C5-6    C7 Middle Finger Ab N Arm Ext//Wrist Flex C6-7 N N Triceps C7    C8 4th & 5th Finger Ab N Flex/ Ext Carpi Ulnaris C8 N N Patellar (L3-4)    T1 Medial Arm Ab N Interossei T1 N N Gastrocnemius    L2 Medial thigh/groin Ab N Illiopsoas (L2-3) N N     L3 Lower thigh/med.knee Ab N Quadriceps (L3-4) N N     L4 Medial leg/lat thigh Ab N Tibialis Ant (L4-5) N N     L5 Lat. leg & dorsal foot Ab N EHL (L5) N N     S1 post/lat foot/thigh/leg Ab N Gastrocnemius (S1-2) N N     S2 Post./med. thigh & leg Ab N Hamstrings (L4-S3) N N       CRANIAL NERVES II, III, IV, VI: Pupils equal and reactive to light, visual acuity and visual fields are intact, extraocular muscles are intact  V: Facial sensation is intact and symmetric bilaterally  VII: Facial strength is intact and symmetric bilaterally  VIII: Hearing is normal as tested by gross conversation IX, X: Palate elevates midline, normal phonation, uvula midline XI: Shoulder shrug strength is intact  XII: Tongue protrudes midline    SOMATOSENSORY Grossly intact to light touch in LUE/LLE as determined by testing dermatomes C2-T2 and L2-S2. All sensation  testing on the right sided is reported as diminished in face, RUE, and RLE. Proprioception and hot/cold testing deferred on this date.   COORDINATION Finger to Nose: Normal Heel to Shin: Normal Pronator Drift: Negative Rapid Alternating Movements: Normal Finger to Thumb Opposition: Normal    RANGE OF MOTION Cervical Spine AROM mildly limited but painless in all directions. No focal deficits in AROM noted in BUE/BLE with the exception of some limitation in R shoulder flexion and abduction s/p R shoulder RTC repair in January 2023;   MANUAL MUSCLE TESTING BUE/BLE strength WNL without focal deficits with the exception of some weakness in R shoulder flexion and abduction s/p R RTC repair in January 2023   TRANSFERS/GAIT Independent for transfers and ambulation without assistive device    PATIENT SURVEYS FOTO: 47, predicted improvement to 53 ABC: 66.9% DHI: 44/100  POSTURAL CONTROL TESTS  08/28/21: mCTSIB: conditions 1-3: 30s, condition 4: 2.5s  OCULOMOTOR / VESTIBULAR TESTING  Oculomotor Exam- Room Light  Findings Comments  Ocular Alignment normal   Ocular ROM normal   Spontaneous Nystagmus normal   Gaze-Holding Nystagmus normal   End-Gaze Nystagmus normal   Vergence (normal 2-3") not examined   Smooth Pursuit normal   Cross-Cover Test not examined   Saccades normal   VOR Cancellation normal   Left Head Impulse normal   Right Head Impulse normal   Static Acuity not examined   Dynamic Acuity not examined     Oculomotor Exam- Fixation Suppressed  Findings Comments  Ocular Alignment normal   Spontaneous Nystagmus normal   Gaze-Holding Nystagmus normal   End-Gaze Nystagmus normal   Head Shaking Nystagmus normal   Pressure-Induced Nystagmus not examined   Hyperventilation Induced Nystagmus not examined   Skull Vibration Induced Nystagmus Attempted but pt unable to tolerate Pt unable to tolerate vibration on mastoid due to chronic pain    BPPV TESTS: (performed  twice on separate dates and both negative)  Symptoms Duration Intensity Nystagmus  L Dix-Hallpike None   None  R Dix-Hallpike None   None  L Head Roll None   None  R Head Roll None   None  L Sidelying Test      R Sidelying Test      (blank = not tested)   FUNCTIONAL OUTCOME MEASURES   08/28/21 Comments  BERG 56/56   DGI    FGA 29/30   TUG    5TSTS    6 Minute Walk Test    10 Meter Gait Speed    (blank = not tested)   Orthostatic vitals (08/28/21) Supine: 116/67 mmHg, HR: 61 bpm, SpO2: 100% Seated: 114/66 mmHg, HR: 59 bpm, SpO2: 100% Standing (1 minute): 111/68 mmHg, HR: 71 bpm, SpO2: 100% Standing (3 minutes): 107/70 mmHg, HR: 70 bpm, SpO2: 100%   TODAY'S TREATMENT (08/28/21)   SUBJECTIVE: Pt reports no changes since the initial evaluation. She continues with dizziness as well as widespread chronic pain. No specific questions upon arrival.   PAIN: Generalized chronic pain in neck, shoulders, legs, and back but unrelated to current episode of care   Neuromuscular Re-education  mCTSIB: conditions 1-3: 30s, condition 4: 2.5s BERG: 56/56; FGA: 29/30  Repeated BPPV testing:  Symptoms Duration Intensity Nystagmus  L Dix-Hallpike None   None  R Dix-Hallpike None   None  L Head Roll None   None  R Head Roll None   None  L Sidelying Test      R Sidelying Test      (blank = not tested)  Orthostatic vitals: Supine: 116/67 mmHg, HR: 61 bpm, SpO2: 100% Seated: 114/66 mmHg, HR: 59 bpm, SpO2: 100% Standing (1 minute): 111/68 mmHg, HR: 71 bpm, SpO2: 100% Standing (3 minutes): 107/70 mmHg, HR: 70 bpm, SpO2: 100%  Seated VOR x 1 horizontal with target on plain background at arms length 3 x 30s, increase in dizziness reported after each repetition;   PATIENT EDUCATION:  Education details: Plan of care, HEP Person educated: Patient Education method: Explanation, Verbal cues, and Handouts Education comprehension: verbalized understanding and returned demonstration     HOME EXERCISE PROGRAM: Access Code: [email or web address] URL: [email or web address] Date: 08/28/2021 Prepared by: Roxana Hires  Exercises - Seated Gaze Stabilization with Head Rotation  - 4 x daily - 7 x weekly - 3 reps - 30 seconds hold   ASSESSMENT:  CLINICAL IMPRESSION: Additional testing performed  with patient during visit today. She demonstrates excellent static and dynamic balance with BERG of 56/56 and FGA of 29/30. Deferred DVA due to neck pain. She struggles with balance during condition 4 (foam/eyes closed) of the mCTSIB losing balance after 2.5s. Repeated BPPV testing today which remains negative and performed orthostatic vital signs which are also negative. Given duration of symptoms (over 5 years) and relatively benign findings as well as prior failure to improve with vestibular therapy discussed referral to ENT for possible VNG testing. Pt is in agreement. She also has an upcoming cardiology consult. Initiated gaze stabilization exercises during session today and issued HEP. Unclear if pt will be able to tolerate due to history of chronic neck pain. Patient will benefit from skilled PT to address above impairments and improve overall function.  REHAB POTENTIAL: Fair  CLINICAL DECISION MAKING: Unstable/unpredictable  EVALUATION COMPLEXITY: High   GOALS: Goals reviewed with patient? No  SHORT TERM GOALS: Target date: 09/19/2021  Pt will be independent with HEP for dizziness in order to decrease symptoms, improve balance,decrease fall risk, and improve function at home. Baseline: Goal status: INITIAL   LONG TERM GOALS: Target date: 10/17/2021  Pt will increase FOTO to at least 53 to demonstrate significant improvement in function at home related to dizziness.  Baseline: 08/22/21: 47 Goal status: INITIAL  2.  Pt will decrease DHI score by at least 18 points in order to demonstrate clinically significant reduction in disability related to dizziness.  Baseline:  08/22/21: 44/100 Goal status: INITIAL  3.  Pt will improve ABC by at least 13% in order to demonstrate clinically significant improvement in balance confidence.      Baseline: 08/22/21: 66.9% Goal status: INITIAL  4. Pt will improve FGA by at least 4 points in order to demonstrate clinically significant improvement in balance and decreased risk for falls.     Baseline: 08/22/21: To be completed; 08/28/21: 29/30 Goal status: CANCELLED    PLAN:  PT FREQUENCY: 1x/week  PT DURATION: 8 weeks  PLANNED INTERVENTIONS: Therapeutic exercises, Therapeutic activity, Neuromuscular re-education, Balance training, Gait training, Patient/Family education, Joint manipulation, Joint mobilization, Canalith repositioning, Aquatic Therapy, Dry Needling, Cognitive remediation, Electrical stimulation, Spinal manipulation, Spinal mobilization, Cryotherapy, Moist heat, Traction, Ultrasound, Ionotophoresis '4mg'$ /ml Dexamethasone, and Manual therapy  PLAN FOR NEXT SESSION: Review HEP and modify as necessary. Progress gaze stabilization, habituation, and balance exercises.    Lyndel Safe Derrick Tiegs PT, DPT, GCS  Kara Jimenez 08/29/2021, 9:26 AM

## 2021-08-28 ENCOUNTER — Ambulatory Visit: Payer: Medicare Other

## 2021-08-28 DIAGNOSIS — R42 Dizziness and giddiness: Secondary | ICD-10-CM | POA: Diagnosis not present

## 2021-08-29 ENCOUNTER — Other Ambulatory Visit: Payer: Self-pay | Admitting: Physical Medicine & Rehabilitation

## 2021-08-29 DIAGNOSIS — G8929 Other chronic pain: Secondary | ICD-10-CM

## 2021-09-04 NOTE — Therapy (Signed)
OUTPATIENT PHYSICAL THERAPY VESTIBULAR TREATMENT   Patient Name: Kara Jimenez MRN: [id number] DOB:1960/11/07, 61 y.o., female Today's Date: 09/05/2021  PCP: Danna Hefty, DO REFERRING PROVIDER: Danna Hefty, DO   PT End of Session - 09/05/21 1106     Visit Number 3    Number of Visits 9    Date for PT Re-Evaluation 10/17/21    Authorization Type eval: 08/22/21    PT Start Time 1110    PT Stop Time 1150    PT Time Calculation (min) 40 min    Activity Tolerance Patient tolerated treatment well    Behavior During Therapy WFL for tasks assessed/performed             Past Medical History:  Diagnosis Date   Anemia    Arthritis    Breast injury    Carpal tunnel syndrome    Complication of anesthesia    pt. states that her HR dropped to 20s when waking up after apppendectomy   Fibromyalgia    GERD (gastroesophageal reflux disease)    Headache    Heart murmur    Hypertension    Sciatica    Sleep apnea    Past Surgical History:  Procedure Laterality Date   BIOPSY  09/09/2020   Procedure: BIOPSY;  Surgeon: Daneil Dolin, MD;  Location: [facility];  Service: Endoscopy;;   BREAST EXCISIONAL BIOPSY Right    benign   CARPAL TUNNEL RELEASE Bilateral    CESAREAN SECTION     COLONOSCOPY WITH PROPOFOL N/A 09/09/2020   Procedure: COLONOSCOPY WITH PROPOFOL;  Surgeon: Daneil Dolin, MD;  Location: [facility];  Service: Endoscopy;  Laterality: N/A;  12:30pm   ESOPHAGOGASTRODUODENOSCOPY (EGD) WITH PROPOFOL N/A 09/09/2020   Procedure: ESOPHAGOGASTRODUODENOSCOPY (EGD) WITH PROPOFOL;  Surgeon: Daneil Dolin, MD;  Location: [facility];  Service: Endoscopy;  Laterality: N/A;   LAPAROSCOPIC APPENDECTOMY N/A 11/05/2019   Procedure: APPENDECTOMY LAPAROSCOPIC;  Surgeon: Virl Cagey, MD;  Location: [facility];  Service: General;  Laterality: N/A;   MALONEY DILATION N/A 09/09/2020   Procedure: Keturah Shavers;  Surgeon: Daneil Dolin, MD;  Location: [facility];  Service: Endoscopy;  Laterality: N/A;   NECK SURGERY     PARTIAL HYSTERECTOMY     POLYPECTOMY  09/09/2020   Procedure: POLYPECTOMY INTESTINAL;  Surgeon: Daneil Dolin, MD;  Location: [facility];  Service: Endoscopy;;   ROTATOR CUFF REPAIR Left    TUBAL LIGATION     Patient Active Problem List   Diagnosis Date Noted   Abdominal pain 05/04/2020   Constipation 05/04/2020   Rectal bleeding 05/04/2020   Dysphagia 05/04/2020   Intraductal papillary mucinous neoplasm of pancreas 12/02/2019   OSA on CPAP 11/16/2019   CPAP use counseling 11/16/2019   Class 2 obesity 11/06/2019   Bradycardia    Acute appendicitis 11/05/2019   Pancreatic lesion 11/05/2019     PCP: Danna Hefty, DO  REFERRING PROVIDER: Danna Hefty, DO  REFERRING DIAGNOSIS: Dizziness and giddiness  THERAPY DIAG: Dizziness and giddiness  RATIONALE FOR EVALUATION AND TREATMENT: Rehabilitation  ONSET DATE: 08/22/2016 (approximate)  FOLLOW UP APPT WITH PROVIDER: Yes    FROM INITIAL EVALUATION (08/22/21):  SUBJECTIVE:   Chief Complaint:  Dizziness  Pertinent History Pt reports she started to experience dizziness approximately 5 years ago. No known trigger at onset and pt denies any head trauma/concussion. The symptoms remained unchanged over the last 5 years however she had an episode of  acute dizziness on the weekend of July 4th during which she fell. No LOC and no head trauma during fall. She reports that her knees felt weak and she continues to have spells of intermittent leg weakness. She reports that her dizziness symptoms have worsened since that time. She previously tried vestibular therapy last fall and states that it did not help. She reports that she has continued the exercises intermittently without any benefit. 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 She reports a history of 1 migraine which is well managed. She denies ENT consult or history of VNG study.  Imaging: 03/25/21: MRI face with and without contrast with trigeminal nerve protocol: normal   06/22/20: MRI Cervical Spine without Contrast: 1. Anterior cervical fusion from C5 through C7 without foraminal or central canal stenosis. 2. At C4-5 there is a mild broad-based disc bulge. Right uncovertebral degenerative changes. Mild right foraminal stenosis.   06/27/2020: MRI HEAD FINDINGS   Brain: No acute infarction, hemorrhage, hydrocephalus, extra-axial collection or mass lesion. The brain parenchyma has normal morphology and signal characteristics. No focus of abnormal contrast enhancement. Vascular: Normal flow voids. Skull and upper cervical spine: Normal marrow signal. Sinuses/Orbits: No acute or significant finding. Other: None.   06/27/2020: MRA HEAD FINDINGS   Anterior circulation: The visualized portions of the distal cervical and intracranial internal carotid arteries are widely patent with normal flow related enhancement. The bilateral anterior cerebral arteries and middle cerebral arteries are widely patent with antegrade flow without high-grade flow-limiting stenosis or proximal branch occlusion. No intracranial aneurysm within the anterior circulation.   Posterior circulation: The vertebral arteries are widely patent with antegrade flow. Vertebrobasilar junction and basilar artery are widely patent with antegrade flow without evidence of basilar stenosis or aneurysm. Posterior cerebral arteries are normal bilaterally. No intracranial aneurysm within the posterior circulation.   Anatomic variants: Hypoplastic right  P1 segment with a prominent right posterior communicating artery (fetal PCA).   IMPRESSION: 1. Unremarkable MRI of the brain. 2. Remarkable MR angiogram the brain.    Description of dizziness: unsteadiness and lightheadedness Frequency: Daily dizziness, 2-3x/wk pt feels like she is going to "pass out" Duration: 1-2 minutes Symptom nature: motion provoked and spontaneous  Progression of symptoms since onset: worse History of similar episodes: No  Provocative Factors: lines on the road with driving, happens spontaneously, spending time outside in the heat make symptoms worse Easing Factors: waiting for symptoms to pass  Auditory complaints (tinnitus, pain, drainage, hearing loss, aural fullness): Yes, tinnitus and aural fullness on L side. She reports having a hearing test and pt reports it was WNL; Vision changes (diplopia, visual field loss, recent changes, recent eye exam): Yes double vision occasionally, slight blurry vision. Wears glasses for distance vision, vision check in July without any changes in prescription. History of glaucoma managed with eye drops; Chest pain/palpitations: Yes, pt reports chest palpitations during episodes. She also reports DOE. She sees cardiology 08/31/21. She reports halter monitor in 2021 and was told she "had an extra heart beat."  History of head injury/concussion: No Stress/anxiety: Yes, pt reports high stress related to multiple health concerns; Migraines: Pt reports one migraine for which she  Numbness/tingling: hands (carpal tunnel), feet, R face Focal weakness: denies  Has patient fallen in last 6 months? Yes, Number of falls: 1 Pertinent pain: Yes, "pinched nerve" in the neck with chronic pain; Dominant hand: right Imaging: Yes, see history  Prior level of function: Independent Occupational demands: On disability Hobbies: "I don't do too much."   Red Flags: Denies: dysarthria, dysphagia, bowel and bladder changes, recent weight loss/gain,  chills, fever. Positive: night sweats   PRECAUTIONS: None  WEIGHT BEARING RESTRICTIONS No  LIVING ENVIRONMENT: Lives with: lives with their family Lives in: House/apartment Stairs: Yes; External: y steps; on right going up, on left going up, and can reach both Has following equipment at home: shower chair  PATIENT GOALS: Decrease symptoms;   OBJECTIVE  EXAMINATION  POSTURE: No gross deficits contributing to symptoms  NEUROLOGICAL SCREEN: (2+ unless otherwise noted.) N=normal  Ab=abnormal  Level Dermatome R L Myotome R L Reflex R L  C3 Anterior Neck Ab N Sidebend C2-3 N N Jaw CN V    C4 Top of Shoulder Ab N Shoulder Shrug C4 N N Hoffman's UMN    C5 Lateral Upper Arm Ab N Shoulder ABD C4-5 N N Biceps C5-6    C6 Lateral Arm/ Thumb Ab N Arm Flex/ Wrist Ext C5-6 N N Brachiorad. C5-6    C7 Middle Finger Ab N Arm Ext//Wrist Flex C6-7 N N Triceps C7    C8 4th & 5th Finger Ab N Flex/ Ext Carpi Ulnaris C8 N N Patellar (L3-4)    T1 Medial Arm Ab N Interossei T1 N N Gastrocnemius    L2 Medial thigh/groin Ab N Illiopsoas (L2-3) N N     L3 Lower thigh/med.knee Ab N Quadriceps (L3-4) N N     L4 Medial leg/lat thigh Ab N Tibialis Ant (L4-5) N N     L5 Lat. leg & dorsal foot Ab N EHL (L5) N N     S1 post/lat foot/thigh/leg Ab N Gastrocnemius (S1-2) N N     S2 Post./med. thigh & leg Ab N Hamstrings (L4-S3) N N       CRANIAL NERVES II, III, IV, VI: Pupils equal and reactive to light, visual acuity and visual fields are intact, extraocular muscles are intact  V: Facial sensation is intact and symmetric bilaterally  VII: Facial strength is intact and symmetric bilaterally  VIII: Hearing is normal as tested by gross conversation IX, X: Palate elevates midline, normal phonation, uvula midline XI: Shoulder shrug strength is intact  XII: Tongue protrudes midline    SOMATOSENSORY Grossly intact to light touch in LUE/LLE as determined by testing dermatomes C2-T2 and L2-S2. All sensation  testing on the right sided is reported as diminished in face, RUE, and RLE. Proprioception and hot/cold testing deferred on this date.   COORDINATION Finger to Nose: Normal Heel to Shin: Normal Pronator Drift: Negative Rapid Alternating Movements: Normal Finger to Thumb Opposition: Normal    RANGE OF MOTION Cervical Spine AROM mildly limited but painless in all directions. No focal deficits in AROM noted in BUE/BLE with the exception of some limitation in R shoulder flexion and abduction s/p R shoulder RTC repair in January 2023;   MANUAL MUSCLE TESTING BUE/BLE strength WNL without focal deficits with the exception of some weakness in R shoulder flexion and abduction s/p R RTC repair in January 2023   TRANSFERS/GAIT Independent for transfers and ambulation without assistive device    PATIENT SURVEYS FOTO: 47, predicted improvement to 53 ABC: 66.9% DHI: 44/100  POSTURAL CONTROL TESTS  08/28/21: mCTSIB: conditions 1-3: 30s, condition 4: 2.5s  OCULOMOTOR / VESTIBULAR TESTING  Oculomotor Exam- Room Light  Findings Comments  Ocular Alignment normal   Ocular ROM normal   Spontaneous Nystagmus normal   Gaze-Holding Nystagmus normal   End-Gaze Nystagmus normal   Vergence (normal 2-3") not examined   Smooth Pursuit normal   Cross-Cover Test not examined   Saccades normal   VOR Cancellation normal   Left Head Impulse normal   Right Head Impulse normal   Static Acuity not examined   Dynamic Acuity not examined     Oculomotor Exam- Fixation Suppressed  Findings Comments  Ocular Alignment normal   Spontaneous Nystagmus normal   Gaze-Holding Nystagmus normal   End-Gaze Nystagmus normal   Head Shaking Nystagmus normal   Pressure-Induced Nystagmus not examined   Hyperventilation Induced Nystagmus not examined   Skull Vibration Induced Nystagmus Attempted but pt unable to tolerate Pt unable to tolerate vibration on mastoid due to chronic pain    BPPV TESTS: (performed  twice on separate dates and both negative)  Symptoms Duration Intensity Nystagmus  L Dix-Hallpike None   None  R Dix-Hallpike None   None  L Head Roll None   None  R Head Roll None   None  L Sidelying Test      R Sidelying Test      (blank = not tested)   FUNCTIONAL OUTCOME MEASURES   08/28/21 Comments  BERG 56/56   DGI    FGA 29/30   TUG    5TSTS    6 Minute Walk Test    10 Meter Gait Speed    (blank = not tested)   Orthostatic vitals (08/28/21) Supine: 116/67 mmHg, HR: 61 bpm, SpO2: 100% Seated: 114/66 mmHg, HR: 59 bpm, SpO2: 100% Standing (1 minute): 111/68 mmHg, HR: 71 bpm, SpO2: 100% Standing (3 minutes): 107/70 mmHg, HR: 70 bpm, SpO2: 100%   TODAY'S TREATMENT (08/28/21)   SUBJECTIVE: Pt reports no changes since the last therapy session. She continues with dizziness as well as widespread chronic pain. She has an MRI scheduled for her chronic low back pain tomorrow. She has an echo and stress test scheduled for 8/29 and 8/30. No specific questions upon arrival.   PAIN: Generalized chronic pain in neck, shoulders, legs, and back but unrelated to current episode of care   Neuromuscular Re-education  All exercises performed in // bars without UE support unless otherwise specified; Pre-exercise vitals: BP: 120/74 mmHg, HR: 68 bpm, SpO2%: 100%; NuStep L1-2 x 5 minutes for warm-up during interval history; Seated VOR x 1 horizontal with target on plain background at arms length x 30s, increase in dizziness from 0/10 to 6/10 reported so deferred additional reps; Feet together (FT) eyes open (EO) /closed (EC) x 30s each; FT EO horizontal and vertical head turns x 30s each; Semitandem (ST) alternating forward LE eyes open (EO) /closed (EC) x 30s each on both sides; ST alternating forward LE EO with horizontal and vertical head turns x 30s each on both sides; Forward/retro tandem gait in // bars, added vertical and horizontal head turns; Airex FT EO/EC x 30s each; Airex FT EO  horizontal and vertical head turns x 30s each; Airex alternating 6" cone taps x 10 BLE; HEP updated and reviewed with patient;   PATIENT EDUCATION:  Education details: Plan of care, HEP modifications Person educated: Patient Education method: Explanation, Verbal cues, and Handouts Education comprehension: verbalized understanding and returned demonstration  HOME EXERCISE PROGRAM: Access Code: [email or web address] URL: [email or web address] Date: 09/05/2021 Prepared by: Roxana Hires  Exercises - Half Tandem Stance Balance with Eyes Closed  - 2 x daily - 7 x weekly - 2 x 30s with each foot forward hold - Half Tandem Stance Balance with Head Nods  - 2 x daily - 7 x weekly - 2 x 30s with each foot forward hold - Half Tandem Stance Balance with Head Rotation  - 2 x daily - 7 x weekly - 2 x 30s with each foot forward hold   ASSESSMENT:  CLINICAL IMPRESSION: Attempted VOR x 1 horizontal again today however due to excessive increase in dizziness deferred additional reps. Pt is also unable to move her head fast enough to engage her VOR. Progressed additional balance and habituation exercises during session today. HEP modified to reflect exercises performed during session. Given duration of symptoms (over 5 years) and relatively benign findings as well as prior failure to improve with vestibular therapy plan is for referral to ENT for possible VNG testing.  She also has an upcoming cardiology consult next week for an echo and stress test. Pt encouraged to perform HEP and follow-up as scheduled. Patient will benefit from skilled PT to address above impairments and improve overall function.  REHAB POTENTIAL: Fair  CLINICAL DECISION MAKING: Unstable/unpredictable  EVALUATION COMPLEXITY: High   GOALS: Goals reviewed with patient? No  SHORT TERM GOALS: Target date: 09/19/2021  Pt will be independent with HEP for dizziness in order to decrease symptoms, improve balance,decrease fall risk,  and improve function at home. Baseline: Goal status: INITIAL   LONG TERM GOALS: Target date: 10/17/2021  Pt will increase FOTO to at least 53 to demonstrate significant improvement in function at home related to dizziness.  Baseline: 08/22/21: 47 Goal status: INITIAL  2.  Pt will decrease DHI score by at least 18 points in order to demonstrate clinically significant reduction in disability related to dizziness.  Baseline: 08/22/21: 44/100 Goal status: INITIAL  3.  Pt will improve ABC by at least 13% in order to demonstrate clinically significant improvement in balance confidence.      Baseline: 08/22/21: 66.9% Goal status: INITIAL  4. Pt will improve FGA by at least 4 points in order to demonstrate clinically significant improvement in balance and decreased risk for falls.     Baseline: 08/22/21: To be completed; 08/28/21: 29/30 Goal status: CANCELLED    PLAN:  PT FREQUENCY: 1x/week  PT DURATION: 8 weeks  PLANNED INTERVENTIONS: Therapeutic exercises, Therapeutic activity, Neuromuscular re-education, Balance training, Gait training, Patient/Family education, Joint manipulation, Joint mobilization, Canalith repositioning, Aquatic Therapy, Dry Needling, Cognitive remediation, Electrical stimulation, Spinal manipulation, Spinal mobilization, Cryotherapy, Moist heat, Traction, Ultrasound, Ionotophoresis '4mg'$ /ml Dexamethasone, and Manual therapy  PLAN FOR NEXT SESSION: Review HEP and modify as necessary. Progress gaze stabilization, habituation, and balance exercises.    Lyndel Safe Yalissa Fink PT, DPT, GCS  Medrith Veillon 09/05/2021, 1:52 PM

## 2021-09-05 ENCOUNTER — Ambulatory Visit: Payer: Medicare Other

## 2021-09-05 DIAGNOSIS — R42 Dizziness and giddiness: Secondary | ICD-10-CM

## 2021-09-06 ENCOUNTER — Ambulatory Visit
Admission: RE | Admit: 2021-09-06 | Discharge: 2021-09-06 | Disposition: A | Discharge status: Home / Self Care | Payer: Medicare Other | Source: Ambulatory Visit | Attending: Physical Medicine & Rehabilitation | Admitting: Physical Medicine & Rehabilitation

## 2021-09-06 DIAGNOSIS — G8929 Other chronic pain: Secondary | ICD-10-CM | POA: Diagnosis present

## 2021-09-06 DIAGNOSIS — M5441 Lumbago with sciatica, right side: Secondary | ICD-10-CM | POA: Insufficient documentation

## 2021-09-25 NOTE — Therapy (Signed)
OUTPATIENT PHYSICAL THERAPY VESTIBULAR TREATMENT   Patient Name: DARNEISHA WINDHORST MRN: [id number] DOB:01-May-1960, 61 y.o., female Today's Date: 09/26/2021  PCP: Danna Hefty, DO REFERRING PROVIDER: Danna Hefty, DO   PT End of Session - 09/26/21 1111     Visit Number 4    Number of Visits 9    Date for PT Re-Evaluation 10/17/21    Authorization Type eval: 08/22/21    PT Start Time 1110    PT Stop Time 1150    PT Time Calculation (min) 40 min    Activity Tolerance Patient tolerated treatment well    Behavior During Therapy WFL for tasks assessed/performed             Past Medical History:  Diagnosis Date   Anemia    Arthritis    Breast injury    Carpal tunnel syndrome    Complication of anesthesia    pt. states that her HR dropped to 20s when waking up after apppendectomy   Fibromyalgia    GERD (gastroesophageal reflux disease)    Headache    Heart murmur    Hypertension    Sciatica    Sleep apnea    Past Surgical History:  Procedure Laterality Date   BIOPSY  09/09/2020   Procedure: BIOPSY;  Surgeon: Daneil Dolin, MD;  Location: [facility];  Service: Endoscopy;;   BREAST EXCISIONAL BIOPSY Right    benign   CARPAL TUNNEL RELEASE Bilateral    CESAREAN SECTION     COLONOSCOPY WITH PROPOFOL N/A 09/09/2020   Procedure: COLONOSCOPY WITH PROPOFOL;  Surgeon: Daneil Dolin, MD;  Location: [facility];  Service: Endoscopy;  Laterality: N/A;  12:30pm   ESOPHAGOGASTRODUODENOSCOPY (EGD) WITH PROPOFOL N/A 09/09/2020   Procedure: ESOPHAGOGASTRODUODENOSCOPY (EGD) WITH PROPOFOL;  Surgeon: Daneil Dolin, MD;  Location: [facility];  Service: Endoscopy;  Laterality: N/A;   LAPAROSCOPIC APPENDECTOMY N/A 11/05/2019   Procedure: APPENDECTOMY LAPAROSCOPIC;  Surgeon: Virl Cagey, MD;  Location: [facility];  Service: General;  Laterality: N/A;   MALONEY DILATION N/A 09/09/2020   Procedure: Keturah Shavers;  Surgeon: Daneil Dolin, MD;  Location: [facility];  Service: Endoscopy;  Laterality: N/A;   NECK SURGERY     PARTIAL HYSTERECTOMY     POLYPECTOMY  09/09/2020   Procedure: POLYPECTOMY INTESTINAL;  Surgeon: Daneil Dolin, MD;  Location: [facility];  Service: Endoscopy;;   ROTATOR CUFF REPAIR Left    TUBAL LIGATION     Patient Active Problem List   Diagnosis Date Noted   Abdominal pain 05/04/2020   Constipation 05/04/2020   Rectal bleeding 05/04/2020   Dysphagia 05/04/2020   Intraductal papillary mucinous neoplasm of pancreas 12/02/2019   OSA on CPAP 11/16/2019   CPAP use counseling 11/16/2019   Class 2 obesity 11/06/2019   Bradycardia    Acute appendicitis 11/05/2019   Pancreatic lesion 11/05/2019     PCP: Danna Hefty, DO  REFERRING PROVIDER: Danna Hefty, DO  REFERRING DIAGNOSIS: Dizziness and giddiness  THERAPY DIAG: Dizziness and giddiness  RATIONALE FOR EVALUATION AND TREATMENT: Rehabilitation  ONSET DATE: 08/22/2016 (approximate)  FOLLOW UP APPT WITH PROVIDER: Yes    FROM INITIAL EVALUATION (08/22/21):  SUBJECTIVE:   Chief Complaint:  Dizziness  Pertinent History Pt reports she started to experience dizziness approximately 5 years ago. No known trigger at onset and pt denies any head trauma/concussion. The symptoms remained unchanged over the last 5 years however she had an episode of  acute dizziness on the weekend of July 4th during which she fell. No LOC and no head trauma during fall. She reports that her knees felt weak and she continues to have spells of intermittent leg weakness. She reports that her dizziness symptoms have worsened since that time. She previously tried vestibular therapy last fall and states that it did not help. She reports that she has continued the exercises intermittently without any benefit. 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 She reports a history of 1 migraine which is well managed. She denies ENT consult or history of VNG study.  Imaging: 03/25/21: MRI face with and without contrast with trigeminal nerve protocol: normal   06/22/20: MRI Cervical Spine without Contrast: 1. Anterior cervical fusion from C5 through C7 without foraminal or central canal stenosis. 2. At C4-5 there is a mild broad-based disc bulge. Right uncovertebral degenerative changes. Mild right foraminal stenosis.   06/27/2020: MRI HEAD FINDINGS   Brain: No acute infarction, hemorrhage, hydrocephalus, extra-axial collection or mass lesion. The brain parenchyma has normal morphology and signal characteristics. No focus of abnormal contrast enhancement. Vascular: Normal flow voids. Skull and upper cervical spine: Normal marrow signal. Sinuses/Orbits: No acute or significant finding. Other: None.   06/27/2020: MRA HEAD FINDINGS   Anterior circulation: The visualized portions of the distal cervical and intracranial internal carotid arteries are widely patent with normal flow related enhancement. The bilateral anterior cerebral arteries and middle cerebral arteries are widely patent with antegrade flow without high-grade flow-limiting stenosis or proximal branch occlusion. No intracranial aneurysm within the anterior circulation.   Posterior circulation: The vertebral arteries are widely patent with antegrade flow. Vertebrobasilar junction and basilar artery are widely patent with antegrade flow without evidence of basilar stenosis or aneurysm. Posterior cerebral arteries are normal bilaterally. No intracranial aneurysm within the posterior circulation.   Anatomic variants: Hypoplastic right  P1 segment with a prominent right posterior communicating artery (fetal PCA).   IMPRESSION: 1. Unremarkable MRI of the brain. 2. Remarkable MR angiogram the brain.    Description of dizziness: unsteadiness and lightheadedness Frequency: Daily dizziness, 2-3x/wk pt feels like she is going to "pass out" Duration: 1-2 minutes Symptom nature: motion provoked and spontaneous  Progression of symptoms since onset: worse History of similar episodes: No  Provocative Factors: lines on the road with driving, happens spontaneously, spending time outside in the heat make symptoms worse Easing Factors: waiting for symptoms to pass  Auditory complaints (tinnitus, pain, drainage, hearing loss, aural fullness): Yes, tinnitus and aural fullness on L side. She reports having a hearing test and pt reports it was WNL; Vision changes (diplopia, visual field loss, recent changes, recent eye exam): Yes double vision occasionally, slight blurry vision. Wears glasses for distance vision, vision check in July without any changes in prescription. History of glaucoma managed with eye drops; Chest pain/palpitations: Yes, pt reports chest palpitations during episodes. She also reports DOE. She sees cardiology 08/31/21. She reports halter monitor in 2021 and was told she "had an extra heart beat."  History of head injury/concussion: No Stress/anxiety: Yes, pt reports high stress related to multiple health concerns; Migraines: Pt reports one migraine for which she  Numbness/tingling: hands (carpal tunnel), feet, R face Focal weakness: denies  Has patient fallen in last 6 months? Yes, Number of falls: 1 Pertinent pain: Yes, "pinched nerve" in the neck with chronic pain; Dominant hand: right Imaging: Yes, see history  Prior level of function: Independent Occupational demands: On disability Hobbies: "I don't do too much."   Red Flags: Denies: dysarthria, dysphagia, bowel and bladder changes, recent weight loss/gain,  chills, fever. Positive: night sweats   PRECAUTIONS: None  WEIGHT BEARING RESTRICTIONS No  LIVING ENVIRONMENT: Lives with: lives with their family Lives in: House/apartment Stairs: Yes; External: y steps; on right going up, on left going up, and can reach both Has following equipment at home: shower chair  PATIENT GOALS: Decrease symptoms;   OBJECTIVE  EXAMINATION  POSTURE: No gross deficits contributing to symptoms  NEUROLOGICAL SCREEN: (2+ unless otherwise noted.) N=normal  Ab=abnormal  Level Dermatome R L Myotome R L Reflex R L  C3 Anterior Neck Ab N Sidebend C2-3 N N Jaw CN V    C4 Top of Shoulder Ab N Shoulder Shrug C4 N N Hoffman's UMN    C5 Lateral Upper Arm Ab N Shoulder ABD C4-5 N N Biceps C5-6    C6 Lateral Arm/ Thumb Ab N Arm Flex/ Wrist Ext C5-6 N N Brachiorad. C5-6    C7 Middle Finger Ab N Arm Ext//Wrist Flex C6-7 N N Triceps C7    C8 4th & 5th Finger Ab N Flex/ Ext Carpi Ulnaris C8 N N Patellar (L3-4)    T1 Medial Arm Ab N Interossei T1 N N Gastrocnemius    L2 Medial thigh/groin Ab N Illiopsoas (L2-3) N N     L3 Lower thigh/med.knee Ab N Quadriceps (L3-4) N N     L4 Medial leg/lat thigh Ab N Tibialis Ant (L4-5) N N     L5 Lat. leg & dorsal foot Ab N EHL (L5) N N     S1 post/lat foot/thigh/leg Ab N Gastrocnemius (S1-2) N N     S2 Post./med. thigh & leg Ab N Hamstrings (L4-S3) N N       CRANIAL NERVES II, III, IV, VI: Pupils equal and reactive to light, visual acuity and visual fields are intact, extraocular muscles are intact  V: Facial sensation is intact and symmetric bilaterally  VII: Facial strength is intact and symmetric bilaterally  VIII: Hearing is normal as tested by gross conversation IX, X: Palate elevates midline, normal phonation, uvula midline XI: Shoulder shrug strength is intact  XII: Tongue protrudes midline    SOMATOSENSORY Grossly intact to light touch in LUE/LLE as determined by testing dermatomes C2-T2 and L2-S2. All sensation  testing on the right sided is reported as diminished in face, RUE, and RLE. Proprioception and hot/cold testing deferred on this date.   COORDINATION Finger to Nose: Normal Heel to Shin: Normal Pronator Drift: Negative Rapid Alternating Movements: Normal Finger to Thumb Opposition: Normal    RANGE OF MOTION Cervical Spine AROM mildly limited but painless in all directions. No focal deficits in AROM noted in BUE/BLE with the exception of some limitation in R shoulder flexion and abduction s/p R shoulder RTC repair in January 2023;   MANUAL MUSCLE TESTING BUE/BLE strength WNL without focal deficits with the exception of some weakness in R shoulder flexion and abduction s/p R RTC repair in January 2023   TRANSFERS/GAIT Independent for transfers and ambulation without assistive device    PATIENT SURVEYS FOTO: 47, predicted improvement to 53 ABC: 66.9% DHI: 44/100  POSTURAL CONTROL TESTS  08/28/21: mCTSIB: conditions 1-3: 30s, condition 4: 2.5s  OCULOMOTOR / VESTIBULAR TESTING  Oculomotor Exam- Room Light  Findings Comments  Ocular Alignment normal   Ocular ROM normal   Spontaneous Nystagmus normal   Gaze-Holding Nystagmus normal   End-Gaze Nystagmus normal   Vergence (normal 2-3") not examined   Smooth Pursuit normal   Cross-Cover Test not examined   Saccades normal   VOR Cancellation normal   Left Head Impulse normal   Right Head Impulse normal   Static Acuity not examined   Dynamic Acuity not examined     Oculomotor Exam- Fixation Suppressed  Findings Comments  Ocular Alignment normal   Spontaneous Nystagmus normal   Gaze-Holding Nystagmus normal   End-Gaze Nystagmus normal   Head Shaking Nystagmus normal   Pressure-Induced Nystagmus not examined   Hyperventilation Induced Nystagmus not examined   Skull Vibration Induced Nystagmus Attempted but pt unable to tolerate Pt unable to tolerate vibration on mastoid due to chronic pain    BPPV TESTS: (performed  twice on separate dates and both negative)  Symptoms Duration Intensity Nystagmus  L Dix-Hallpike None   None  R Dix-Hallpike None   None  L Head Roll None   None  R Head Roll None   None  L Sidelying Test      R Sidelying Test      (blank = not tested)   FUNCTIONAL OUTCOME MEASURES   08/28/21 Comments  BERG 56/56   DGI    FGA 29/30   TUG    5TSTS    6 Minute Walk Test    10 Meter Gait Speed    (blank = not tested)   Orthostatic vitals (08/28/21) Supine: 116/67 mmHg, HR: 61 bpm, SpO2: 100% Seated: 114/66 mmHg, HR: 59 bpm, SpO2: 100% Standing (1 minute): 111/68 mmHg, HR: 71 bpm, SpO2: 100% Standing (3 minutes): 107/70 mmHg, HR: 70 bpm, SpO2: 100%   TODAY'S TREATMENT   SUBJECTIVE: Pt reports feeling "a little lightheaded" but no dizziness upon arrival. She has had multiple medical appointments since her last therapy session. She saw Dr. Posey Pronto for her R shoulder pain and had a steroid injection. She states that she saw cardiology and her tests were all WNL. No specific questions upon arrival.   PAIN: Generalized chronic pain in neck, shoulders, legs, and back but unrelated to current episode of care   Neuromuscular Re-education  All exercises performed in // bars without UE support unless otherwise specified; NuStep L1-2 x 6 minutes for warm-up during interval history; Seated VOR x 1 horizontal with target on plain background at arms length 3 x 30s, increase in dizziness from 0/10 to 5/10 after first rep, 6/10 after second rep, 7/10 after third rep; Tandem alternating forward LE eyes open (EO) /closed (EC) x 30s each on both sides; Tandem alternating forward LE EO with horizontal and vertical head turns x 30s each on both sides; Forward/retro tandem gait in // bars, added vertical and horizontal head turns; Forward gait in hallway with vertical ball lifts with head/eye follow 2 x 70'; Forward gait in hallway with horizontal ball passes between hands with head/eye follow 2 x  70'; Forward gait in hallway with horizontal ball passes to therapist with head/eye follow 2 x 70' toward each side; Airex feet together (FT) EO/EC x 30s each; Airex FT EO horizontal and vertical head turns x 30s each; Airex FT ball passes around body with return pass by therapist x multiple bouts on each  side; Airex alternating 6" step taps x 10 BLE; Verbal HEP review with patient;   PATIENT EDUCATION:  Education details: Plan of care, HEP modifications Person educated: Patient Education method: Explanation, Verbal cues, and Handouts Education comprehension: verbalized understanding and returned demonstration    HOME EXERCISE PROGRAM: Access Code: [email or web address] URL: [email or web address] Date: 09/05/2021 Prepared by: Roxana Hires  Exercises - Half Tandem Stance Balance with Eyes Closed  - 2 x daily - 7 x weekly - 2 x 30s with each foot forward hold - Half Tandem Stance Balance with Head Nods  - 2 x daily - 7 x weekly - 2 x 30s with each foot forward hold - Half Tandem Stance Balance with Head Rotation  - 2 x daily - 7 x weekly - 2 x 30s with each foot forward hold  Add VOR x 1 horizontal in sitting 3 x 30s, QID back to program if she is able to complete without an increase in dizziness greater than 7/10;  ASSESSMENT:  CLINICAL IMPRESSION: Progressed additional balance and habituation exercises during session today. Reintroduced VOR x 1 horizontal and she is able to complete without an excessive increase in dizziness. Pt encouraged to add VOR x 1 horizontal in sitting 3 x 30s, QID back to her home program if she is able to complete without an increase in dizziness greater than 7/10. Given duration of symptoms (over 5 years) and relatively benign findings during PT evaluation as well as prior failure to improve with vestibular therapy plan is for referral to ENT for possible VNG testing. Pt encouraged to follow-up as scheduled. Patient will benefit from skilled PT to address  above impairments and improve overall function.  REHAB POTENTIAL: Fair  CLINICAL DECISION MAKING: Unstable/unpredictable  EVALUATION COMPLEXITY: High   GOALS: Goals reviewed with patient? No  SHORT TERM GOALS: Target date: 09/19/2021  Pt will be independent with HEP for dizziness in order to decrease symptoms, improve balance,decrease fall risk, and improve function at home. Baseline: Goal status: INITIAL   LONG TERM GOALS: Target date: 10/17/2021  Pt will increase FOTO to at least 53 to demonstrate significant improvement in function at home related to dizziness.  Baseline: 08/22/21: 47 Goal status: INITIAL  2.  Pt will decrease DHI score by at least 18 points in order to demonstrate clinically significant reduction in disability related to dizziness.  Baseline: 08/22/21: 44/100 Goal status: INITIAL  3.  Pt will improve ABC by at least 13% in order to demonstrate clinically significant improvement in balance confidence.      Baseline: 08/22/21: 66.9% Goal status: INITIAL  4. Pt will improve FGA by at least 4 points in order to demonstrate clinically significant improvement in balance and decreased risk for falls.     Baseline: 08/22/21: To be completed; 08/28/21: 29/30 Goal status: CANCELLED    PLAN:  PT FREQUENCY: 1x/week  PT DURATION: 8 weeks  PLANNED INTERVENTIONS: Therapeutic exercises, Therapeutic activity, Neuromuscular re-education, Balance training, Gait training, Patient/Family education, Joint manipulation, Joint mobilization, Canalith repositioning, Aquatic Therapy, Dry Needling, Cognitive remediation, Electrical stimulation, Spinal manipulation, Spinal mobilization, Cryotherapy, Moist heat, Traction, Ultrasound, Ionotophoresis '4mg'$ /ml Dexamethasone, and Manual therapy  PLAN FOR NEXT SESSION: Review HEP and modify as necessary. Progress gaze stabilization, habituation, and balance exercises.    Lyndel Safe Lacie Landry PT, DPT, GCS  Ralph Brouwer 09/26/2021, 1:05 PM

## 2021-09-26 ENCOUNTER — Ambulatory Visit: Payer: Medicare Other | Attending: Family Medicine

## 2021-09-26 DIAGNOSIS — R42 Dizziness and giddiness: Secondary | ICD-10-CM | POA: Insufficient documentation

## 2021-10-03 ENCOUNTER — Ambulatory Visit: Payer: Medicare Other

## 2021-10-03 DIAGNOSIS — R42 Dizziness and giddiness: Secondary | ICD-10-CM

## 2021-10-03 NOTE — Therapy (Signed)
OUTPATIENT PHYSICAL THERAPY VESTIBULAR TREATMENT   Patient Name: Kara Jimenez MRN: [id number] DOB:09-Feb-1960, 61 y.o., female Today's Date: 10/03/2021  PCP: Danna Hefty, DO REFERRING PROVIDER: Danna Hefty, DO   PT End of Session - 10/03/21 1105     Visit Number 5    Number of Visits 9    Date for PT Re-Evaluation 10/17/21    Authorization Type eval: 08/22/21    PT Start Time 1103    PT Stop Time 1145    PT Time Calculation (min) 42 min    Activity Tolerance Patient tolerated treatment well    Behavior During Therapy WFL for tasks assessed/performed              Past Medical History:  Diagnosis Date   Anemia    Arthritis    Breast injury    Carpal tunnel syndrome    Complication of anesthesia    pt. states that her HR dropped to 20s when waking up after apppendectomy   Fibromyalgia    GERD (gastroesophageal reflux disease)    Headache    Heart murmur    Hypertension    Sciatica    Sleep apnea    Past Surgical History:  Procedure Laterality Date   BIOPSY  09/09/2020   Procedure: BIOPSY;  Surgeon: Daneil Dolin, MD;  Location: [facility];  Service: Endoscopy;;   BREAST EXCISIONAL BIOPSY Right    benign   CARPAL TUNNEL RELEASE Bilateral    CESAREAN SECTION     COLONOSCOPY WITH PROPOFOL N/A 09/09/2020   Procedure: COLONOSCOPY WITH PROPOFOL;  Surgeon: Daneil Dolin, MD;  Location: [facility];  Service: Endoscopy;  Laterality: N/A;  12:30pm   ESOPHAGOGASTRODUODENOSCOPY (EGD) WITH PROPOFOL N/A 09/09/2020   Procedure: ESOPHAGOGASTRODUODENOSCOPY (EGD) WITH PROPOFOL;  Surgeon: Daneil Dolin, MD;  Location: [facility];  Service: Endoscopy;  Laterality: N/A;   LAPAROSCOPIC APPENDECTOMY N/A 11/05/2019   Procedure: APPENDECTOMY LAPAROSCOPIC;  Surgeon: Virl Cagey, MD;  Location: [facility];  Service: General;  Laterality: N/A;   MALONEY DILATION N/A 09/09/2020   Procedure: Keturah Shavers;  Surgeon: Daneil Dolin, MD;  Location: [facility];  Service: Endoscopy;  Laterality: N/A;   NECK SURGERY     PARTIAL HYSTERECTOMY     POLYPECTOMY  09/09/2020   Procedure: POLYPECTOMY INTESTINAL;  Surgeon: Daneil Dolin, MD;  Location: [facility];  Service: Endoscopy;;   ROTATOR CUFF REPAIR Left    TUBAL LIGATION     Patient Active Problem List   Diagnosis Date Noted   Abdominal pain 05/04/2020   Constipation 05/04/2020   Rectal bleeding 05/04/2020   Dysphagia 05/04/2020   Intraductal papillary mucinous neoplasm of pancreas 12/02/2019   OSA on CPAP 11/16/2019   CPAP use counseling 11/16/2019   Class 2 obesity 11/06/2019   Bradycardia    Acute appendicitis 11/05/2019   Pancreatic lesion 11/05/2019     PCP: Danna Hefty, DO  REFERRING PROVIDER: Danna Hefty, DO  REFERRING DIAGNOSIS: Dizziness and giddiness  THERAPY DIAG: Dizziness and giddiness  RATIONALE FOR EVALUATION AND TREATMENT: Rehabilitation  ONSET DATE: 08/22/2016 (approximate)  FOLLOW UP APPT WITH PROVIDER: Yes    FROM INITIAL EVALUATION (08/22/21):  SUBJECTIVE:   Chief Complaint:  Dizziness  Pertinent History Pt reports she started to experience dizziness approximately 5 years ago. No known trigger at onset and pt denies any head trauma/concussion. The symptoms remained unchanged over the last 5 years however she had an episode  of acute dizziness on the weekend of July 4th during which she fell. No LOC and no head trauma during fall. She reports that her knees felt weak and she continues to have spells of intermittent leg weakness. She reports that her dizziness symptoms have worsened since that time. She previously tried vestibular therapy last fall and states that it did not help. She reports that she has continued the exercises intermittently without any benefit. 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 She reports a history of 1 migraine which is well managed. She denies ENT consult or history of VNG study.  Imaging: 03/25/21: MRI face with and without contrast with trigeminal nerve protocol: normal   06/22/20: MRI Cervical Spine without Contrast: 1. Anterior cervical fusion from C5 through C7 without foraminal or central canal stenosis. 2. At C4-5 there is a mild broad-based disc bulge. Right uncovertebral degenerative changes. Mild right foraminal stenosis.   06/27/2020: MRI HEAD FINDINGS   Brain: No acute infarction, hemorrhage, hydrocephalus, extra-axial collection or mass lesion. The brain parenchyma has normal morphology and signal characteristics. No focus of abnormal contrast enhancement. Vascular: Normal flow voids. Skull and upper cervical spine: Normal marrow signal. Sinuses/Orbits: No acute or significant finding. Other: None.   06/27/2020: MRA HEAD FINDINGS   Anterior circulation: The visualized portions of the distal cervical and intracranial internal carotid arteries are widely patent with normal flow related enhancement. The bilateral anterior cerebral arteries and middle cerebral arteries are widely patent with antegrade flow without high-grade flow-limiting stenosis or proximal branch occlusion. No intracranial aneurysm within the anterior circulation.   Posterior circulation: The vertebral arteries are widely patent with antegrade flow. Vertebrobasilar junction and basilar artery are widely patent with antegrade flow without evidence of basilar stenosis or aneurysm. Posterior cerebral arteries are normal bilaterally. No intracranial aneurysm within the posterior circulation.   Anatomic variants: Hypoplastic right  P1 segment with a prominent right posterior communicating artery (fetal PCA).   IMPRESSION: 1. Unremarkable MRI of the brain. 2. Remarkable MR angiogram the brain.    Description of dizziness: unsteadiness and lightheadedness Frequency: Daily dizziness, 2-3x/wk pt feels like she is going to "pass out" Duration: 1-2 minutes Symptom nature: motion provoked and spontaneous  Progression of symptoms since onset: worse History of similar episodes: No  Provocative Factors: lines on the road with driving, happens spontaneously, spending time outside in the heat make symptoms worse Easing Factors: waiting for symptoms to pass  Auditory complaints (tinnitus, pain, drainage, hearing loss, aural fullness): Yes, tinnitus and aural fullness on L side. She reports having a hearing test and pt reports it was WNL; Vision changes (diplopia, visual field loss, recent changes, recent eye exam): Yes double vision occasionally, slight blurry vision. Wears glasses for distance vision, vision check in July without any changes in prescription. History of glaucoma managed with eye drops; Chest pain/palpitations: Yes, pt reports chest palpitations during episodes. She also reports DOE. She sees cardiology 08/31/21. She reports halter monitor in 2021 and was told she "had an extra heart beat."  History of head injury/concussion: No Stress/anxiety: Yes, pt reports high stress related to multiple health concerns; Migraines: Pt reports one migraine for which she  Numbness/tingling: hands (carpal tunnel), feet, R face Focal weakness: denies  Has patient fallen in last 6 months? Yes, Number of falls: 1 Pertinent pain: Yes, "pinched nerve" in the neck with chronic pain; Dominant hand: right Imaging: Yes, see history  Prior level of function: Independent Occupational demands: On disability Hobbies: "I don't do too much."   Red Flags: Denies: dysarthria, dysphagia, bowel and bladder changes, recent weight loss/gain,  chills, fever. Positive: night sweats   PRECAUTIONS: None  WEIGHT BEARING RESTRICTIONS No  LIVING ENVIRONMENT: Lives with: lives with their family Lives in: House/apartment Stairs: Yes; External: y steps; on right going up, on left going up, and can reach both Has following equipment at home: shower chair  PATIENT GOALS: Decrease symptoms;   OBJECTIVE  EXAMINATION  POSTURE: No gross deficits contributing to symptoms  NEUROLOGICAL SCREEN: (2+ unless otherwise noted.) N=normal  Ab=abnormal  Level Dermatome R L Myotome R L Reflex R L  C3 Anterior Neck Ab N Sidebend C2-3 N N Jaw CN V    C4 Top of Shoulder Ab N Shoulder Shrug C4 N N Hoffman's UMN    C5 Lateral Upper Arm Ab N Shoulder ABD C4-5 N N Biceps C5-6    C6 Lateral Arm/ Thumb Ab N Arm Flex/ Wrist Ext C5-6 N N Brachiorad. C5-6    C7 Middle Finger Ab N Arm Ext//Wrist Flex C6-7 N N Triceps C7    C8 4th & 5th Finger Ab N Flex/ Ext Carpi Ulnaris C8 N N Patellar (L3-4)    T1 Medial Arm Ab N Interossei T1 N N Gastrocnemius    L2 Medial thigh/groin Ab N Illiopsoas (L2-3) N N     L3 Lower thigh/med.knee Ab N Quadriceps (L3-4) N N     L4 Medial leg/lat thigh Ab N Tibialis Ant (L4-5) N N     L5 Lat. leg & dorsal foot Ab N EHL (L5) N N     S1 post/lat foot/thigh/leg Ab N Gastrocnemius (S1-2) N N     S2 Post./med. thigh & leg Ab N Hamstrings (L4-S3) N N       CRANIAL NERVES II, III, IV, VI: Pupils equal and reactive to light, visual acuity and visual fields are intact, extraocular muscles are intact  V: Facial sensation is intact and symmetric bilaterally  VII: Facial strength is intact and symmetric bilaterally  VIII: Hearing is normal as tested by gross conversation IX, X: Palate elevates midline, normal phonation, uvula midline XI: Shoulder shrug strength is intact  XII: Tongue protrudes midline    SOMATOSENSORY Grossly intact to light touch in LUE/LLE as determined by testing dermatomes C2-T2 and L2-S2. All sensation  testing on the right sided is reported as diminished in face, RUE, and RLE. Proprioception and hot/cold testing deferred on this date.   COORDINATION Finger to Nose: Normal Heel to Shin: Normal Pronator Drift: Negative Rapid Alternating Movements: Normal Finger to Thumb Opposition: Normal    RANGE OF MOTION Cervical Spine AROM mildly limited but painless in all directions. No focal deficits in AROM noted in BUE/BLE with the exception of some limitation in R shoulder flexion and abduction s/p R shoulder RTC repair in January 2023;   MANUAL MUSCLE TESTING BUE/BLE strength WNL without focal deficits with the exception of some weakness in R shoulder flexion and abduction s/p R RTC repair in January 2023   TRANSFERS/GAIT Independent for transfers and ambulation without assistive device    PATIENT SURVEYS FOTO: 47, predicted improvement to 53 ABC: 66.9% DHI: 44/100  POSTURAL CONTROL TESTS  08/28/21: mCTSIB: conditions 1-3: 30s, condition 4: 2.5s  OCULOMOTOR / VESTIBULAR TESTING  Oculomotor Exam- Room Light  Findings Comments  Ocular Alignment normal   Ocular ROM normal   Spontaneous Nystagmus normal   Gaze-Holding Nystagmus normal   End-Gaze Nystagmus normal   Vergence (normal 2-3") not examined   Smooth Pursuit normal   Cross-Cover Test not examined   Saccades normal   VOR Cancellation normal   Left Head Impulse normal   Right Head Impulse normal   Static Acuity not examined   Dynamic Acuity not examined     Oculomotor Exam- Fixation Suppressed  Findings Comments  Ocular Alignment normal   Spontaneous Nystagmus normal   Gaze-Holding Nystagmus normal   End-Gaze Nystagmus normal   Head Shaking Nystagmus normal   Pressure-Induced Nystagmus not examined   Hyperventilation Induced Nystagmus not examined   Skull Vibration Induced Nystagmus Attempted but pt unable to tolerate Pt unable to tolerate vibration on mastoid due to chronic pain    BPPV TESTS: (performed  twice on separate dates and both negative)  Symptoms Duration Intensity Nystagmus  L Dix-Hallpike None   None  R Dix-Hallpike None   None  L Head Roll None   None  R Head Roll None   None  L Sidelying Test      R Sidelying Test      (blank = not tested)   FUNCTIONAL OUTCOME MEASURES   08/28/21 Comments  BERG 56/56   DGI    FGA 29/30   TUG    5TSTS    6 Minute Walk Test    10 Meter Gait Speed    (blank = not tested)   Orthostatic vitals (08/28/21) Supine: 116/67 mmHg, HR: 61 bpm, SpO2: 100% Seated: 114/66 mmHg, HR: 59 bpm, SpO2: 100% Standing (1 minute): 111/68 mmHg, HR: 71 bpm, SpO2: 100% Standing (3 minutes): 107/70 mmHg, HR: 70 bpm, SpO2: 100%   TODAY'S TREATMENT   SUBJECTIVE: Pt reports feeling "a little lightheaded" but no dizziness upon arrival. She was at a football game on Thursday and got very lightheaded and dizzy. She had to sit down and wait and eventually symptoms improved (after 10 minutes). Symptoms never completely resolved and after pt arrived home she reports that she had to go to sleep. She felt lightheaded all day Friday and reports that she had to stay in bed all day. No specific questions upon arrival.   PAIN: Generalized chronic pain in neck, shoulders, legs, and back but unrelated to current episode of care   Neuromuscular Re-education  All exercises performed in // bars without UE support unless otherwise specified; NuStep L1-2 x 6 minutes for warm-up during interval history; Vitals: BP: 139/76 mmHg, HR: 70, SpO2: 100% Airex feet together (FT) EO/EC x 30s each; Airex alternating 6" step taps x 10 BLE; Airex FT EO horizontal and vertical head turns x 30s each; Airex FT ball passes around body with return pass by therapist x multiple bouts on each side; Staggered stance with rear foot on Airex pad and front foot on 6" step (alternating forward LE) static balance x 30s each; Staggered stance with rear foot on Airex pad and front foot on 6" step  (alternating forward LE) horizontal and vertical head turns x 30s each; Forward/retro tandem gait 15' x multiple laps; Forward/retro tandem gait with vertical and horizontal head turns 15' x multiple laps each; Airex balance beam tandem gait forward/backward x multiple laps; Airex balance beam side stepping x multiple  laps; Airex balance beam side stepping with vertical and horizontal head turns x multiple laps; Epley and Roll Maneuvers negative bilaterally;   Not performed: Seated VOR x 1 horizontal with target on plain background at arms length 3 x 30s, increase in dizziness from 0/10 to 5/10 after first rep, 6/10 after second rep, 7/10 after third rep; Tandem alternating forward LE eyes open (EO) /closed (EC) x 30s each on both sides; Tandem alternating forward LE EO with horizontal and vertical head turns x 30s each on both sides; Forward gait in hallway with vertical ball lifts with head/eye follow 2 x 70'; Forward gait in hallway with horizontal ball passes between hands with head/eye follow 2 x 70'; Forward gait in hallway with horizontal ball passes to therapist with head/eye follow 2 x 70' toward each side;   PATIENT EDUCATION:  Education details: Plan of care, HEP modifications Person educated: Patient Education method: Explanation, Verbal cues, and Handouts Education comprehension: verbalized understanding and returned demonstration    HOME EXERCISE PROGRAM: Access Code: [email or web address] URL: [email or web address] Date: 09/05/2021 Prepared by: Roxana Hires  Exercises - Half Tandem Stance Balance with Eyes Closed  - 2 x daily - 7 x weekly - 2 x 30s with each foot forward hold - Half Tandem Stance Balance with Head Nods  - 2 x daily - 7 x weekly - 2 x 30s with each foot forward hold - Half Tandem Stance Balance with Head Rotation  - 2 x daily - 7 x weekly - 2 x 30s with each foot forward hold  Add VOR x 1 horizontal in sitting 3 x 30s, QID back to program if she is  able to complete without an increase in dizziness greater than 7/10;  ASSESSMENT:  CLINICAL IMPRESSION: Progressed additional balance and habituation exercises during session today. During vertical head turns on Airex balance beam pt reports considerable dizziness/lightheadedness. Performed Epley and Roll manuevers which are both negative bilaterally. She will need update outcome measures and goals at next appointment. If no significant improvement identified pt will be discharged. Given duration of symptoms (over 5 years) and relatively benign findings during PT evaluation as well as prior failure to improve with vestibular therapy plan is for referral to ENT for possible VNG testing. Pt encouraged to follow-up as scheduled. Patient will benefit from skilled PT to address above impairments and improve overall function.  REHAB POTENTIAL: Fair  CLINICAL DECISION MAKING: Unstable/unpredictable  EVALUATION COMPLEXITY: High   GOALS: Goals reviewed with patient? No  SHORT TERM GOALS: Target date: 09/19/2021  Pt will be independent with HEP for dizziness in order to decrease symptoms, improve balance,decrease fall risk, and improve function at home. Baseline: Goal status: INITIAL   LONG TERM GOALS: Target date: 10/17/2021  Pt will increase FOTO to at least 53 to demonstrate significant improvement in function at home related to dizziness.  Baseline: 08/22/21: 47 Goal status: INITIAL  2.  Pt will decrease DHI score by at least 18 points in order to demonstrate clinically significant reduction in disability related to dizziness.  Baseline: 08/22/21: 44/100 Goal status: INITIAL  3.  Pt will improve ABC by at least 13% in order to demonstrate clinically significant improvement in balance confidence.      Baseline: 08/22/21: 66.9% Goal status: INITIAL  4. Pt will improve FGA by at least 4 points in order to demonstrate clinically significant improvement in balance and decreased risk for falls.      Baseline: 08/22/21: To be completed; 08/28/21: 29/30 Goal status: CANCELLED  PLAN:  PT FREQUENCY: 1x/week  PT DURATION: 8 weeks  PLANNED INTERVENTIONS: Therapeutic exercises, Therapeutic activity, Neuromuscular re-education, Balance training, Gait training, Patient/Family education, Joint manipulation, Joint mobilization, Canalith repositioning, Aquatic Therapy, Dry Needling, Cognitive remediation, Electrical stimulation, Spinal manipulation, Spinal mobilization, Cryotherapy, Moist heat, Traction, Ultrasound, Ionotophoresis '4mg'$ /ml Dexamethasone, and Manual therapy  PLAN FOR NEXT SESSION: Update outcome measures and goals, consider discharge if no progress demonstrated. Review HEP and modify as necessary. Progress gaze stabilization, habituation, and balance exercises.    Lyndel Safe Dariona Postma PT, DPT, GCS  Daffney Greenly 10/03/2021, 3:04 PM

## 2021-10-10 ENCOUNTER — Ambulatory Visit: Payer: Medicare Other

## 2021-10-10 DIAGNOSIS — R42 Dizziness and giddiness: Secondary | ICD-10-CM | POA: Diagnosis not present

## 2021-10-10 NOTE — Therapy (Signed)
OUTPATIENT PHYSICAL THERAPY VESTIBULAR TREATMENT/DISCHARGE   Patient Name: Kara Jimenez MRN: [id number] DOB:11/24/1960, 61 y.o., female Today's Date: 10/10/2021  PCP: Danna Hefty, DO REFERRING PROVIDER: Danna Hefty, DO   PT End of Session - 10/10/21 1032     Visit Number 6    Number of Visits 9    Date for PT Re-Evaluation 10/17/21    Authorization Type eval: 08/22/21    PT Start Time 1100    PT Stop Time 1145    PT Time Calculation (min) 45 min    Activity Tolerance Patient tolerated treatment well    Behavior During Therapy WFL for tasks assessed/performed            Past Medical History:  Diagnosis Date   Anemia    Arthritis    Breast injury    Carpal tunnel syndrome    Complication of anesthesia    pt. states that her HR dropped to 20s when waking up after apppendectomy   Fibromyalgia    GERD (gastroesophageal reflux disease)    Headache    Heart murmur    Hypertension    Sciatica    Sleep apnea    Past Surgical History:  Procedure Laterality Date   BIOPSY  09/09/2020   Procedure: BIOPSY;  Surgeon: Daneil Dolin, MD;  [facility]: [facility];  Service: Endoscopy;;   BREAST EXCISIONAL BIOPSY Right    benign   CARPAL TUNNEL RELEASE Bilateral    CESAREAN SECTION     COLONOSCOPY WITH PROPOFOL N/A 09/09/2020   Procedure: COLONOSCOPY WITH PROPOFOL;  Surgeon: Daneil Dolin, MD;  Location: [facility];  Service: Endoscopy;  Laterality: N/A;  12:30pm   ESOPHAGOGASTRODUODENOSCOPY (EGD) WITH PROPOFOL N/A 09/09/2020   Procedure: ESOPHAGOGASTRODUODENOSCOPY (EGD) WITH PROPOFOL;  Surgeon: Daneil Dolin, MD;  Location: [facility];  Service: Endoscopy;  Laterality: N/A;   LAPAROSCOPIC APPENDECTOMY N/A 11/05/2019   Procedure: APPENDECTOMY LAPAROSCOPIC;  Surgeon: Virl Cagey, MD;  Location: [facility];  Service: General;  Laterality: N/A;   MALONEY DILATION N/A 09/09/2020   Procedure: Keturah Shavers;  Surgeon: Daneil Dolin, MD;  Location: [facility];  Service: Endoscopy;  Laterality: N/A;   NECK SURGERY     PARTIAL HYSTERECTOMY     POLYPECTOMY  09/09/2020   Procedure: POLYPECTOMY INTESTINAL;  Surgeon: Daneil Dolin, MD;  Location: [facility];  Service: Endoscopy;;   ROTATOR CUFF REPAIR Left    TUBAL LIGATION     Patient Active Problem List   Diagnosis Date Noted   Abdominal pain 05/04/2020   Constipation 05/04/2020   Rectal bleeding 05/04/2020   Dysphagia 05/04/2020   Intraductal papillary mucinous neoplasm of pancreas 12/02/2019   OSA on CPAP 11/16/2019   CPAP use counseling 11/16/2019   Class 2 obesity 11/06/2019   Bradycardia    Acute appendicitis 11/05/2019   Pancreatic lesion 11/05/2019   PCP: Danna Hefty, DO  REFERRING PROVIDER: Danna Hefty, DO  REFERRING DIAGNOSIS: Dizziness and giddiness  THERAPY DIAG: Dizziness and giddiness  RATIONALE FOR EVALUATION AND TREATMENT: Rehabilitation  ONSET DATE: 08/22/2016 (approximate)  FOLLOW UP APPT WITH PROVIDER: Yes    FROM INITIAL EVALUATION (08/22/21):  SUBJECTIVE:   Chief Complaint:  Dizziness  Pertinent History Pt reports she started to experience dizziness approximately 5 years ago. No known trigger at onset and pt denies any head trauma/concussion. The symptoms remained unchanged over the last 5 years however she had an episode of acute dizziness on  the weekend of July 4th during which she fell. No LOC and no head trauma during fall. She reports that her knees felt weak and she continues to have spells of intermittent leg weakness. She reports that her dizziness symptoms have worsened since that time. She previously tried vestibular therapy last fall and states that it did not help. She reports that she has continued the exercises intermittently without any benefit. 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 She reports a history of 1 migraine which is well managed. She denies ENT consult or history of VNG study.  Imaging: 03/25/21: MRI face with and without contrast with trigeminal nerve protocol: normal   06/22/20: MRI Cervical Spine without Contrast: 1. Anterior cervical fusion from C5 through C7 without foraminal or central canal stenosis. 2. At C4-5 there is a mild broad-based disc bulge. Right uncovertebral degenerative changes. Mild right foraminal stenosis.   06/27/2020: MRI HEAD FINDINGS   Brain: No acute infarction, hemorrhage, hydrocephalus, extra-axial collection or mass lesion. The brain parenchyma has normal morphology and signal characteristics. No focus of abnormal contrast enhancement. Vascular: Normal flow voids. Skull and upper cervical spine: Normal marrow signal. Sinuses/Orbits: No acute or significant finding. Other: None.   06/27/2020: MRA HEAD FINDINGS   Anterior circulation: The visualized portions of the distal cervical and intracranial internal carotid arteries are widely patent with normal flow related enhancement. The bilateral anterior cerebral arteries and middle cerebral arteries are widely patent with antegrade flow without high-grade flow-limiting stenosis or proximal branch occlusion. No intracranial aneurysm within the anterior circulation.   Posterior circulation: The vertebral arteries are widely patent with antegrade flow. Vertebrobasilar junction and basilar artery are widely patent with antegrade flow without evidence of basilar stenosis or aneurysm. Posterior cerebral arteries are normal bilaterally. No intracranial aneurysm within the posterior circulation.   Anatomic variants: Hypoplastic right  P1 segment with a prominent right posterior communicating artery (fetal PCA).   IMPRESSION: 1. Unremarkable MRI of the brain. 2. Remarkable MR angiogram the brain.   Description of dizziness: unsteadiness and lightheadedness Frequency: Daily dizziness, 2-3x/wk pt feels like she is going to "pass out" Duration: 1-2 minutes Symptom nature: motion provoked and spontaneous  Progression of symptoms since onset: worse History of similar episodes: No  Provocative Factors: lines on the road with driving, happens spontaneously, spending time outside in the heat make symptoms worse Easing Factors: waiting for symptoms to pass  Auditory complaints (tinnitus, pain, drainage, hearing loss, aural fullness): Yes, tinnitus and aural fullness on L side. She reports having a hearing test and pt reports it was WNL; Vision changes (diplopia, visual field loss, recent changes, recent eye exam): Yes double vision occasionally, slight blurry vision. Wears glasses for distance vision, vision check in July without any changes in prescription. History of glaucoma managed with eye drops; Chest pain/palpitations: Yes, pt reports chest palpitations during episodes. She also reports DOE. She sees cardiology 08/31/21. She reports halter monitor in 2021 and was told she "had an extra heart beat."  History of head injury/concussion: No Stress/anxiety: Yes, pt reports high stress related to multiple health concerns; Migraines: Pt reports one migraine for which she  Numbness/tingling: hands (carpal tunnel),  feet, R face Focal weakness: denies  Has patient fallen in last 6 months? Yes, Number of falls: 1 Pertinent pain: Yes, "pinched nerve" in the neck with chronic pain; Dominant hand: right Imaging: Yes, see history  Prior level of function: Independent Occupational demands: On disability Hobbies: "I don't do too much."   Red Flags: Denies: dysarthria, dysphagia, bowel and bladder changes, recent weight loss/gain,  chills, fever. Positive: night sweats   PRECAUTIONS: None  WEIGHT BEARING RESTRICTIONS No  LIVING ENVIRONMENT: Lives with: lives with their family Lives in: House/apartment Stairs: Yes; External: y steps; on right going up, on left going up, and can reach both Has following equipment at home: shower chair  PATIENT GOALS: Decrease symptoms;   OBJECTIVE  EXAMINATION  POSTURE: No gross deficits contributing to symptoms  NEUROLOGICAL SCREEN: (2+ unless otherwise noted.) N=normal  Ab=abnormal  Level Dermatome R L Myotome R L Reflex R L  C3 Anterior Neck Ab N Sidebend C2-3 N N Jaw CN V    C4 Top of Shoulder Ab N Shoulder Shrug C4 N N Hoffman's UMN    C5 Lateral Upper Arm Ab N Shoulder ABD C4-5 N N Biceps C5-6    C6 Lateral Arm/ Thumb Ab N Arm Flex/ Wrist Ext C5-6 N N Brachiorad. C5-6    C7 Middle Finger Ab N Arm Ext//Wrist Flex C6-7 N N Triceps C7    C8 4th & 5th Finger Ab N Flex/ Ext Carpi Ulnaris C8 N N Patellar (L3-4)    T1 Medial Arm Ab N Interossei T1 N N Gastrocnemius    L2 Medial thigh/groin Ab N Illiopsoas (L2-3) N N     L3 Lower thigh/med.knee Ab N Quadriceps (L3-4) N N     L4 Medial leg/lat thigh Ab N Tibialis Ant (L4-5) N N     L5 Lat. leg & dorsal foot Ab N EHL (L5) N N     S1 post/lat foot/thigh/leg Ab N Gastrocnemius (S1-2) N N     S2 Post./med. thigh & leg Ab N Hamstrings (L4-S3) N N       CRANIAL NERVES II, III, IV, VI: Pupils equal and reactive to light, visual acuity and visual fields are intact, extraocular muscles are intact  V: Facial sensation is intact and symmetric bilaterally  VII: Facial strength is intact and symmetric bilaterally  VIII: Hearing is normal as tested by gross conversation IX, X: Palate elevates midline, normal phonation, uvula midline XI: Shoulder shrug strength is intact  XII: Tongue protrudes midline   SOMATOSENSORY Grossly intact to light touch in LUE/LLE as determined by testing dermatomes C2-T2 and L2-S2. All sensation testing  on the right sided is reported as diminished in face, RUE, and RLE. Proprioception and hot/cold testing deferred on this date.  COORDINATION Finger to Nose: Normal Heel to Shin: Normal Pronator Drift: Negative Rapid Alternating Movements: Normal Finger to Thumb Opposition: Normal  RANGE OF MOTION Cervical Spine AROM mildly limited but painless in all directions. No focal deficits in AROM noted in BUE/BLE with the exception of some limitation in R shoulder flexion and abduction s/p R shoulder RTC repair in January 2023;  MANUAL MUSCLE TESTING BUE/BLE strength WNL without focal deficits with the exception of some weakness in R shoulder flexion and abduction s/p R RTC repair in January 2023  TRANSFERS/GAIT Independent for transfers and ambulation without assistive device   PATIENT SURVEYS FOTO: 47, predicted improvement to 53 ABC: 66.9% DHI: 44/100  POSTURAL CONTROL TESTS  08/28/21: mCTSIB: conditions 1-3: 30s, condition  4: 2.5s  OCULOMOTOR / VESTIBULAR TESTING  Oculomotor Exam- Room Light  Findings Comments  Ocular Alignment normal   Ocular ROM normal   Spontaneous Nystagmus normal   Gaze-Holding Nystagmus normal   End-Gaze Nystagmus normal   Vergence (normal 2-3") not examined   Smooth Pursuit normal   Cross-Cover Test not examined   Saccades normal   VOR Cancellation normal   Left Head Impulse normal   Right Head Impulse normal   Static Acuity not examined   Dynamic Acuity not examined    Oculomotor Exam- Fixation Suppressed  Findings Comments  Ocular Alignment normal   Spontaneous Nystagmus normal   Gaze-Holding Nystagmus normal   End-Gaze Nystagmus normal   Head Shaking Nystagmus normal   Pressure-Induced Nystagmus not examined   Hyperventilation Induced Nystagmus not examined   Skull Vibration Induced Nystagmus Attempted but pt unable to tolerate Pt unable to tolerate vibration on mastoid due to chronic pain   BPPV TESTS: (performed twice on separate dates  and both negative)  Symptoms Duration Intensity Nystagmus  L Dix-Hallpike None   None  R Dix-Hallpike None   None  L Head Roll None   None  R Head Roll None   None  L Sidelying Test      R Sidelying Test      (blank = not tested)  FUNCTIONAL OUTCOME MEASURES   08/28/21 Comments  BERG 56/56   DGI    FGA 29/30   TUG    5TSTS    6 Minute Walk Test    10 Meter Gait Speed    (blank = not tested)  Orthostatic vitals (08/28/21) Supine: 116/67 mmHg, HR: 61 bpm, SpO2: 100% Seated: 114/66 mmHg, HR: 59 bpm, SpO2: 100% Standing (1 minute): 111/68 mmHg, HR: 71 bpm, SpO2: 100% Standing (3 minutes): 107/70 mmHg, HR: 70 bpm, SpO2: 100%   TODAY'S TREATMENT   SUBJECTIVE: Pt reports feeling "lightheaded" but no dizziness upon arrival. States that in general her dizziness seems to be worsening. She was in bed most of the day yesterday. No specific questions upon arrival.   PAIN: Generalized chronic pain in neck, shoulders, legs, and back but unrelated to current episode of care   Neuromuscular Re-education  Pre-exercise vitals: BP: 153/69 mmHg, HR: 68 bpm, SpO2: 100%  Updated outcome measures and goals with patient: FOTO: 51 DHI: 46/100 ABC: 58.8%  Forward/retro gait in hallway with vertical ball lifts with head/eye follow 2 x 70' each; Forward/retro gait in hallway with horizontal ball passes between hands with head/eye follow 2 x 70' each; Forward/retro gait in hallway with horizontal ball passes to therapist with head/eye follow x 70' each, toward each side;  Tandem gait forward/retro 15' x 4; Tandem gait forward/retro with horizontal and vertical head turns 15' x 4; Airex tandem balance alternating forward LE eyes open (EO) x 30s each on each side; Tandem alternating forward LE EO with horizontal and vertical head turns x 30s each on both sides; Discharge instructions;    PATIENT EDUCATION:  Education details: Plan of care, HEP modifications Person educated:  Patient Education method: Explanation, Verbal cues, and Handouts Education comprehension: verbalized understanding and returned demonstration    HOME EXERCISE PROGRAM: Access Code: [email or web address] URL: [email or web address] Date: 09/05/2021 Prepared by: Roxana Hires  Exercises - Half Tandem Stance Balance with Eyes Closed  - 2 x daily - 7 x weekly - 2 x 30s with each foot forward hold - Half Tandem Stance Balance with Head Nods  - 2 x daily -  7 x weekly - 2 x 30s with each foot forward hold - Half Tandem Stance Balance with Head Rotation  - 2 x daily - 7 x weekly - 2 x 30s with each foot forward hold  Add VOR x 1 horizontal in sitting 3 x 30s, QID back to program if she is able to complete without an increase in dizziness greater than 7/10;  ASSESSMENT:  CLINICAL IMPRESSION: Updated outcome measures with pt during visit today. Her FOTO improved slightly however her ABC and DHI worsened. Overall she reports slight worsening of her symptoms since starting with therapy. Progressed additional balance and habituation exercises during session today. Given duration of symptoms (over 5 years) and relatively benign findings during PT evaluation as well as prior and current failure to improve with vestibular therapy plan is for referral to ENT for possible VNG testing. Pt will be discharged on this date.    REHAB POTENTIAL: Fair  CLINICAL DECISION MAKING: Unstable/unpredictable  EVALUATION COMPLEXITY: High   GOALS: Goals reviewed with patient? No  SHORT TERM GOALS: Target date: 09/19/2021  Pt will be independent with HEP for dizziness in order to decrease symptoms, improve balance,decrease fall risk, and improve function at home. Baseline: Goal status: INITIAL   LONG TERM GOALS: Target date: 10/17/2021  Pt will increase FOTO to at least 53 to demonstrate significant improvement in function at home related to dizziness.  Baseline: 08/22/21: 47; 10/10/21: 51 Goal status:  INITIAL  2.  Pt will decrease DHI score by at least 18 points in order to demonstrate clinically significant reduction in disability related to dizziness.  Baseline: 08/22/21: 44/100; 10/10/21: 46/100 Goal status: INITIAL  3.  Pt will improve ABC by at least 13% in order to demonstrate clinically significant improvement in balance confidence.      Baseline: 08/22/21: 66.9%; 10/10/21: 58.8% Goal status: INITIAL  4. Pt will improve FGA by at least 4 points in order to demonstrate clinically significant improvement in balance and decreased risk for falls.     Baseline: 08/22/21: To be completed; 08/28/21: 29/30 Goal status: CANCELLED    PLAN:  PT FREQUENCY: 1x/week  PT DURATION: 8 weeks  PLANNED INTERVENTIONS: Therapeutic exercises, Therapeutic activity, Neuromuscular re-education, Balance training, Gait training, Patient/Family education, Joint manipulation, Joint mobilization, Canalith repositioning, Aquatic Therapy, Dry Needling, Cognitive remediation, Electrical stimulation, Spinal manipulation, Spinal mobilization, Cryotherapy, Moist heat, Traction, Ultrasound, Ionotophoresis '4mg'$ /ml Dexamethasone, and Manual therapy  PLAN FOR NEXT SESSION: Discharge  Lyndel Safe Reneshia Zuccaro PT, DPT, GCS  Lerry Cordrey 10/10/2021, 1:46 PM

## 2021-11-09 ENCOUNTER — Other Ambulatory Visit: Payer: Self-pay | Admitting: Specialist

## 2021-11-09 DIAGNOSIS — R079 Chest pain, unspecified: Secondary | ICD-10-CM

## 2021-11-14 ENCOUNTER — Encounter: Payer: Self-pay | Admitting: Internal Medicine

## 2021-11-17 ENCOUNTER — Ambulatory Visit
Admission: RE | Admit: 2021-11-17 | Discharge: 2021-11-17 | Disposition: A | Discharge status: Home / Self Care | Payer: Medicare Other | Source: Ambulatory Visit | Attending: Specialist | Admitting: Specialist

## 2021-11-17 DIAGNOSIS — R079 Chest pain, unspecified: Secondary | ICD-10-CM

## 2021-11-27 NOTE — Progress Notes (Signed)
GI Office Note    Referring Provider: Danna Hefty, DO Primary Care Physician:  Danna Hefty, DO  Primary Gastroenterologist: Garfield Cornea, MD   Chief Complaint   Chief Complaint  Patient presents with   Constipation    Has issues on and off   Abdominal Pain    Pain all over   Gastroesophageal Reflux    Only takes tums     History of Present Illness   Kara Jimenez is a 61 y.o. female presenting today at the request of Kara Maxin DO for esophageal dysphagia, H.pylori, constipation. Seen 01/2021 by video visit. 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 Last MRI 06/2021 at [facility]   EGD/colonoscopy in 08/2020. She had H.pylori gastritis, esophagus empirically dilated. She had colonic diverticulosis, hemorrhoids, single tubular adenoma removed. Next colonoscopy in 7 years.    She completed her h.pylori treatment. States her PCP did H.pylori breath test about a month later and was negative. She says she was off PPI and Abx for at least a month before the test. If fact, she did not continue PPI after H.pylori treatment.   Patient complains of epigastric pain/ left upper abdomen.  Has acid reflux, taking TUMS. Dysphagia to solids and liquids at times. Previously having similar symptoms but seemed better when on PPI. Taking fiber pills and occasional Exlax. On fiber pills for over a week. Starting to have better BMs. No melena. No brbpr.   Chest CT showed slight distal esophageal wall thickening.     Medications   Current Outpatient Medications  Medication Sig Dispense Refill   acetaminophen (TYLENOL) 500 MG tablet Take 2 tablets (1,000 mg total) by mouth every 8 (eight) hours. 90 tablet 2   Brinzolamide-Brimonidine (SIMBRINZA) 1-0.2 % SUSP Place 1 drop into both eyes 3 (three) times daily.     Cholecalciferol (VITAMIN D) 125 MCG (5000 UT) CAPS Take 5,000 Units by mouth daily.      olmesartan (BENICAR) 5 MG tablet Take 5 mg by mouth daily.            No current facility-administered medications for this visit.    Allergies   Allergies as of 11/28/2021 - Review Complete 10/10/2021  Allergen Reaction Noted   Amitriptyline Shortness Of Breath 10/11/2014   Carbamazepine Shortness Of Breath and Nausea And Vomiting 12/15/2020   Duloxetine Shortness Of Breath 01/22/2017   Gabapentin Shortness Of Breath 10/11/2014   Pregabalin Shortness Of Breath 10/11/2014   Sertraline Shortness Of Breath 09/26/2016   Hydrochlorothiazide Nausea And Vomiting 11/05/2019   Baclofen Hives 04/21/2019   Lisinopril Other (See Comments) 10/17/2021   Prednisone  11/05/2019   Amlodipine Rash 11/05/2019   Ibuprofen Nausea And Vomiting and Rash 04/21/2019       Review of Systems   General: Negative for anorexia, weight loss, fever, chills, fatigue, weakness. Eyes: Negative for vision changes.  ENT: Negative for hoarseness,  nasal congestion. See hpi CV: Negative for chest pain, angina, palpitations, dyspnea on exertion, peripheral edema.  Respiratory: Negative for dyspnea at rest, dyspnea on exertion, cough, sputum, wheezing.  GI: See history of present illness. GU:  Negative for dysuria, hematuria, urinary incontinence, urinary frequency, nocturnal urination.  MS: Negative for joint pain, low back pain.  Derm: Negative for rash or itching.  Neuro: Negative for weakness, abnormal sensation, seizure, frequent headaches, memory loss,  confusion.  Psych: Negative for anxiety, depression, suicidal ideation, hallucinations.  Endo:  Negative for unusual weight change.  Heme: Negative for bruising or bleeding. Allergy: Negative for rash or hives.  Physical Exam   BP 104/67 (BP Location: Right Arm, Patient Position: Sitting, Cuff Size: Large)   Pulse 73   Temp 97.8 F (36.6 C) (Oral)   Ht '5\' 6"'$  (1.676 m)   Wt 222 lb (100.7 kg)   LMP 04/15/2019   SpO2 98%   BMI 35.83 kg/m     General: Well-nourished, well-developed in no acute distress.  Head: Normocephalic, atraumatic.   Eyes: Conjunctiva pink, no icterus. Mouth: Oropharyngeal mucosa moist and pink , no lesions erythema or exudate. Neck: Supple without thyromegaly, masses, or lymphadenopathy.  Lungs: Clear to auscultation bilaterally.  Heart: Regular rate and rhythm, no murmurs rubs or gallops.  Abdomen: Bowel sounds are normal, nontender, nondistended, no hepatosplenomegaly or masses, no abdominal bruits or hernia, no rebound or guarding.   Rectal:not performed Extremities: No lower extremity edema. No clubbing or deformities.  Neuro: Alert and oriented x 4 , grossly normal neurologically.  Skin: Warm and dry, no rash or jaundice.   Psych: Alert and cooperative, normal mood and affect.  Labs   None available  Imaging Studies   CT CHEST WO CONTRAST  Result Date: 11/20/2021 CLINICAL DATA:  Chest pain for 2 weeks. History of COPD and breast biopsy. Former smoker. EXAM: CT CHEST WITHOUT CONTRAST TECHNIQUE: Multidetector CT imaging of the chest was performed following the standard protocol without IV contrast. RADIATION DOSE REDUCTION: This exam was performed according to the departmental dose-optimization program which includes automated exposure control, adjustment of the mA and/or kV according to patient size and/or use of iterative reconstruction technique. COMPARISON:  None Available. FINDINGS: Cardiovascular: Heart size normal.  No pericardial effusion. Mediastinum/Nodes: No pathologically enlarged mediastinal or axillary lymph nodes. Hilar regions are difficult to evaluate without IV contrast. There may be slight distal esophageal wall thickening. Lungs/Pleura: Paraseptal emphysema. Lungs are clear. No pleural fluid. Airway is unremarkable. Upper Abdomen: Liver may be mildly heterogeneous in attenuation. Small low-attenuation lesions in the liver are too small to characterize. Visualized portions of the  adrenal glands, kidneys, spleen, pancreas, stomach and bowel are otherwise grossly unremarkable. No upper abdominal adenopathy. Musculoskeletal: Degenerative changes in the spine. IMPRESSION: 1. No acute findings to explain the patient's chest pain. 2. Slight distal esophageal wall thickening can be seen with gastroesophageal reflux. Please correlate clinically. 3. Liver may be mildly steatotic. 4.  Emphysema (ICD10-J43.9). Electronically Signed   By: Lorin Picket M.D.   On: 11/20/2021 11:11    Assessment   GERD/dysphagia: no longer on PPI. Patient states she has had similar symptoms in the past that responded to PPI. Likely has reflux esophagitis. Slight distal esophageal wall thickening on chest CT. EGD last year reassuring. Restart PPI. If ongoing symptoms, consider repeat EGD/ED.   Constipation: improved on daily fiber. Can add miralax if needed.   H.pylori gastritis: treated. Patient reports H.pylori breath test last year after treated.   Pancreatic lesion: followed at [facility]  Hepatic steatosis: LFTs previously unremarkable. Recommend 1-2# weight loss per week until ideal body weight through exercise & diet. Low fat/cholesterol diet. Avoid sweets, sodas, fruit juices, sweetened beverages like tea, etc. Gradually increase exercise from 15 min daily up to 1 hr per day 5 days/week. Limit alcohol use.    PLAN   Continue fiber supplement daily. Try to get 3 grams per day in supplement.  Can add Miralax one capful daily as needed.  Start pantoprazole '40mg'$  daily  before breakfast.  Send message in 2-3 weeks regarding reflux/dysphagia.   Laureen Ochs. Bobby Rumpf, [place], [facility]

## 2021-11-28 ENCOUNTER — Ambulatory Visit (INDEPENDENT_AMBULATORY_CARE_PROVIDER_SITE_OTHER): Payer: Medicare Other | Admitting: Gastroenterology

## 2021-11-28 ENCOUNTER — Encounter: Payer: Self-pay | Admitting: Gastroenterology

## 2021-11-28 VITALS — BP 104/67 | HR 73 | Temp 97.8°F | Ht 66.0 in | Wt 222.0 lb

## 2021-11-28 DIAGNOSIS — R1310 Dysphagia, unspecified: Secondary | ICD-10-CM | POA: Diagnosis not present

## 2021-11-28 DIAGNOSIS — K5900 Constipation, unspecified: Secondary | ICD-10-CM | POA: Diagnosis not present

## 2021-11-28 DIAGNOSIS — K219 Gastro-esophageal reflux disease without esophagitis: Secondary | ICD-10-CM | POA: Diagnosis not present

## 2021-11-28 MED ORDER — PANTOPRAZOLE SODIUM 40 MG PO TBEC: 40.0000 mg | DELAYED_RELEASE_TABLET | Freq: Every day | ORAL | 11 refills | Status: DC

## 2021-11-28 NOTE — Patient Instructions (Signed)
Continue your fiber supplement as you are right now. If you feel like not effective, increase to 2-3 times per day. You can also add Miralax one capful daily (strong stool softener) which will help with stool frequency as well. Start pantoprazole '40mg'$  daily before breakfast.  Send me a message in 2-3 weeks and left me know how your reflux is doing. If we need to make further adjustments with your medications we can.

## 2021-12-19 ENCOUNTER — Other Ambulatory Visit: Payer: Self-pay | Admitting: Gastroenterology

## 2021-12-19 ENCOUNTER — Encounter: Payer: Self-pay | Admitting: Gastroenterology

## 2021-12-19 MED ORDER — FAMOTIDINE 20 MG PO TABS: 20.0000 mg | ORAL_TABLET | Freq: Two times a day (BID) | ORAL | 1 refills | Status: DC

## 2022-01-12 NOTE — Telephone Encounter (Signed)
Patient needs ov with Dr. Gala Romney for GERD.

## 2022-02-16 ENCOUNTER — Other Ambulatory Visit: Payer: Self-pay

## 2022-02-16 ENCOUNTER — Ambulatory Visit (INDEPENDENT_AMBULATORY_CARE_PROVIDER_SITE_OTHER): Payer: 59 | Admitting: Internal Medicine

## 2022-02-16 ENCOUNTER — Encounter: Payer: Self-pay | Admitting: Internal Medicine

## 2022-02-16 VITALS — BP 94/64 | HR 57 | Temp 97.5°F | Ht 66.0 in | Wt 221.6 lb

## 2022-02-16 DIAGNOSIS — R1310 Dysphagia, unspecified: Secondary | ICD-10-CM | POA: Diagnosis not present

## 2022-02-16 DIAGNOSIS — K219 Gastro-esophageal reflux disease without esophagitis: Secondary | ICD-10-CM

## 2022-02-16 DIAGNOSIS — K5900 Constipation, unspecified: Secondary | ICD-10-CM

## 2022-02-16 MED ORDER — RABEPRAZOLE SODIUM 20 MG PO TBEC: 20.0000 mg | DELAYED_RELEASE_TABLET | Freq: Every day | ORAL | 11 refills | Status: DC

## 2022-02-16 NOTE — Progress Notes (Signed)
Primary Care Physician:  Danna Hefty, DO Primary Gastroenterologist:  Dr. Gala Romney  Pre-Procedure History & Physical: HPI:  Kara Jimenez is a 62 y.o. female here for office follow-up.  History of GERD and history of dysphagia and normal esophagus empirically dilated 2022 with improvement.  Felt pantoprazole caused nausea.  History of IPMN follow closely at [facility]  - due for an MRI later this year.  No constipation with associated abdominal cramps.  Takes Ex-Lax as needed.  Single adenoma removed 2022; due for surveillance colonoscopy 2029.  Past Medical History:  Diagnosis Date   Anemia    Arthritis    Breast injury    Carpal tunnel syndrome    Complication of anesthesia    pt. states that her HR dropped to 20s when waking up after apppendectomy   Fibromyalgia    GERD (gastroesophageal reflux disease)    Headache    Heart murmur    Hypertension    Sciatica    Sleep apnea     Past Surgical History:  Procedure Laterality Date   BIOPSY  09/09/2020   Procedure: BIOPSY;  Surgeon: Daneil Dolin, MD;  [facility];  Service: Endoscopy;;   BREAST EXCISIONAL BIOPSY Right    benign   CARPAL TUNNEL RELEASE Bilateral    CESAREAN SECTION     COLONOSCOPY WITH PROPOFOL N/A 09/09/2020   Procedure: COLONOSCOPY WITH PROPOFOL;  Surgeon: Daneil Dolin, MD;  [facility];  Service: Endoscopy;  Laterality: N/A;  12:30pm   ESOPHAGOGASTRODUODENOSCOPY (EGD) WITH PROPOFOL N/A 09/09/2020   Procedure: ESOPHAGOGASTRODUODENOSCOPY (EGD) WITH PROPOFOL;  Surgeon: Daneil Dolin, MD;  [facility];  Service: Endoscopy;  Laterality: N/A;   LAPAROSCOPIC APPENDECTOMY N/A 11/05/2019   Procedure: APPENDECTOMY LAPAROSCOPIC;  Surgeon: Virl Cagey, MD;  Location: [facility];  Service: General;  Laterality: N/A;   MALONEY DILATION N/A 09/09/2020   Procedure: Keturah Shavers;  Surgeon: Daneil Dolin, MD;  [facility];  Service: Endoscopy;  Laterality: N/A;    NECK SURGERY     PARTIAL HYSTERECTOMY     POLYPECTOMY  09/09/2020   Procedure: POLYPECTOMY INTESTINAL;  Surgeon: Daneil Dolin, MD;  [facility];  Service: Endoscopy;;   ROTATOR CUFF REPAIR Left    TUBAL LIGATION      Prior to Admission medications   Medication Sig Start Date End Date Taking? Authorizing Provider  Acetaminophen 325 MG CAPS Take by mouth.   Yes [provider]  Brinzolamide-Brimonidine (SIMBRINZA) 1-0.2 % SUSP Place 1 drop into both eyes 3 (three) times daily. 02/27/17  Yes [provider]  Cholecalciferol (VITAMIN D) 125 MCG (5000 UT) CAPS Take 5,000 Units by mouth daily.   Yes [provider]  diphenhydrAMINE (BENADRYL) 25 MG tablet 1 tablet Orally Once a day as needed   Yes [provider]  levalbuterol (XOPENEX HFA) 45 MCG/ACT inhaler Inhale into the lungs. 11/21/21 11/21/22 Yes [provider]  loratadine (CLARITIN) 10 MG tablet 1 tablet Orally Once a day   Yes [provider]  olmesartan (BENICAR) 5 MG tablet Take 5 mg by mouth daily.   Yes [provider]    Allergies as of 02/16/2022 - Review Complete 02/16/2022  Allergen Reaction Noted   Amitriptyline Shortness Of Breath 10/11/2014   Carbamazepine Shortness Of Breath and Nausea And Vomiting 12/15/2020   Duloxetine Shortness Of Breath 01/22/2017   Gabapentin Shortness Of Breath 10/11/2014   Pregabalin Shortness Of Breath  10/11/2014   Sertraline Shortness Of Breath 09/26/2016   Hydrochlorothiazide Nausea And Vomiting 11/05/2019   Baclofen Hives 04/21/2019   Cyclobenzaprine  02/16/2022   Duloxetine hcl  02/16/2022   Lisinopril Other (See Comments) 10/17/2021   Meclizine hcl  02/16/2022   Pantoprazole sodium Other (See Comments) 12/19/2021   Prednisone  11/05/2019   Tizanidine  02/16/2022   Travoprost  02/16/2022   Venlafaxine  02/16/2022   Amlodipine Rash 11/05/2019   Ibuprofen Nausea And Vomiting and Rash 04/21/2019    Family  History  Problem Relation Age of Onset   HIV/AIDS Mother    Hypertension Father     Social History   Socioeconomic History   Marital status: Single    Spouse name: Not on file   Number of children: Not on file   Years of education: Not on file   Highest education level: Not on file  Occupational History   Not on file  Tobacco Use   Smoking status: Former   Smokeless tobacco: Never  Vaping Use   Vaping Use: Never used  Substance and Sexual Activity   Alcohol use: Not Currently    Comment: occaisional   Drug use: Yes    Types: Marijuana    Comment: last used last week   Sexual activity: Not Currently  Other Topics Concern   Not on file  Social History Narrative   Nephew lives with her   Social Determinants of Health   Financial Resource Strain: Not on file  Food Insecurity: Not on file  Transportation Needs: Not on file  Physical Activity: Not on file  Stress: Not on file  Social Connections: Not on file  Intimate Partner Violence: Not on file    Review of Systems: See HPI, otherwise negative ROS  Physical Exam: BP 94/64 (BP Location: Right Arm, Patient Position: Sitting, Cuff Size: Large)   Pulse (!) 57   Temp (!) 97.5 F (36.4 C) (Oral)   Ht '5\' 6"'$  (1.676 m)   Wt 221 lb 9.6 oz (100.5 kg)   LMP 04/15/2019   SpO2 94%   BMI 35.77 kg/m  General:   Alert,   pleasant and cooperative in NAD Abdomen: Non-distended, normal bowel sounds.  Soft and nontender without appreciable mass or hepatosplenomegaly.   Impression/Plan: 62 year old lady with GERD, intermittent constipation.  No alarm symptoms.  Up-to-date on EGD and colonoscopy.  Interesting symptom of nausea with pantoprazole.  Bowel regimen needs optimization  History of colonic adenoma; due for surveillance 2029.  Recommendations:  GERD information provided.  Begin rabeprazole or Aciphex 20 mg daily 30 minutes before breakfast (dispense 30 with 11 refills)  Try reflux Gourmet ([email or web address]) take a dose  after you eat as needed for acid reflux  Use MiraLAX 1 capful or 17 g in 8 ounces of water at bedtime any given day no bowel movement  Phone report in 2 weeks  Office visit with [place] in 3 months  Keep  follow-up appointment at [facility] later this year  Surveillance colonoscopy 2029.        Notice: This dictation was prepared with Dragon dictation along with smaller phrase technology. Any transcriptional errors that result from this process are unintentional and may not be corrected upon review.

## 2022-02-16 NOTE — Patient Instructions (Signed)
It was good to see you again today!  GERD information provided.  Begin rabeprazole or Aciphex 20 mg daily 30 minutes before breakfast (dispense 30 with 11 refills)  Try reflux Gourmet ([email or web address]) take a dose after you eat as needed for acid reflux  Use MiraLAX 1 capful or 17 g in 8 ounces of water at bedtime any given day you do not have a bowel movement  [place] know how you are doing in 2 weeks  Office visit with [place] in 3 months  Keep your follow-up appointment at [place] later this year  Surveillance colonoscopy 2029.

## 2022-03-15 ENCOUNTER — Encounter: Payer: Self-pay | Admitting: Radiology

## 2022-05-18 ENCOUNTER — Encounter: Payer: Self-pay | Admitting: Internal Medicine

## 2022-05-18 ENCOUNTER — Ambulatory Visit (INDEPENDENT_AMBULATORY_CARE_PROVIDER_SITE_OTHER): Payer: 59 | Admitting: Internal Medicine

## 2022-05-18 VITALS — BP 96/65 | HR 54 | Temp 97.4°F | Ht 66.0 in | Wt 226.2 lb

## 2022-05-18 DIAGNOSIS — A048 Other specified bacterial intestinal infections: Secondary | ICD-10-CM | POA: Diagnosis not present

## 2022-05-18 NOTE — Patient Instructions (Addendum)
It was good to see you again today!  Continue rabeprazole 20 mg daily for acid reflux  Continue MiraLAX or generic equivalent daily.  I would lean towards taking it every day and would hold off only in the case of diarrhea  You did have an H. pylori breath test which indicates the infection was eradicated.  You do not need any further testing.  Keep your appointments at [facility] regarding the pancreas lesion  Plan for colonoscopy in thousand 29.  Office visit here in 1 year.

## 2022-05-18 NOTE — Progress Notes (Signed)
Primary Care Physician:  Joana Reamer, DO Primary Gastroenterologist:  Dr. Jena Gauss  Pre-Procedure History & Physical: HPI:  Kara Jimenez is a 62 y.o. female here for follow-up of GERD.  Nausea with pantoprazole.  Doing very well on rabeprazole 20 mg daily.  Takes Tums for rare breakthrough symptoms.  No dysphagia.  History of colonic adenoma removed 2022; due for surveillance examination 2029. Constipation managed with MiraLAX as needed in occasional abdominal cramps if she goes a day without a bowel movement.  History of IPMN followed closely at [facility] she has an appointment there later in the year. Her of H. pylori gastritis treated with eradication documented by breath testing.  Past Medical History:  Diagnosis Date   Anemia    Arthritis    Breast injury    Carpal tunnel syndrome    Complication of anesthesia    pt. states that her HR dropped to 20s when waking up after apppendectomy   Fibromyalgia    GERD (gastroesophageal reflux disease)    Headache    Heart murmur    Hypertension    Sciatica    Sleep apnea     Past Surgical History:  Procedure Laterality Date   BIOPSY  09/09/2020   Procedure: BIOPSY;  Surgeon: Corbin Ade, MD;  [facility];  Service: Endoscopy;;   BREAST EXCISIONAL BIOPSY Right    benign   CARPAL TUNNEL RELEASE Bilateral    CESAREAN SECTION     COLONOSCOPY WITH PROPOFOL N/A 09/09/2020   Procedure: COLONOSCOPY WITH PROPOFOL;  Surgeon: Corbin Ade, MD;  [facility];  Service: Endoscopy;  Laterality: N/A;  12:30pm   ESOPHAGOGASTRODUODENOSCOPY (EGD) WITH PROPOFOL N/A 09/09/2020   Procedure: ESOPHAGOGASTRODUODENOSCOPY (EGD) WITH PROPOFOL;  Surgeon: Corbin Ade, MD;  [facility];  Service: Endoscopy;  Laterality: N/A;   LAPAROSCOPIC APPENDECTOMY N/A 11/05/2019   Procedure: APPENDECTOMY LAPAROSCOPIC;  Surgeon: Lucretia Roers, MD;  Location: [facility];  Service: General;  Laterality: N/A;   MALONEY  DILATION N/A 09/09/2020   Procedure: Alvy Beal;  Surgeon: Corbin Ade, MD;  [facility];  Service: Endoscopy;  Laterality: N/A;   NECK SURGERY     PARTIAL HYSTERECTOMY     POLYPECTOMY  09/09/2020   Procedure: POLYPECTOMY INTESTINAL;  Surgeon: Corbin Ade, MD;  [facility];  Service: Endoscopy;;   ROTATOR CUFF REPAIR Left    TUBAL LIGATION      Prior to Admission medications   Medication Sig Start Date End Date Taking? Authorizing Provider  Acetaminophen 325 MG CAPS Take by mouth.   Yes [provider]  Brinzolamide-Brimonidine (SIMBRINZA) 1-0.2 % SUSP Place 1 drop into both eyes 3 (three) times daily. 02/27/17  Yes [provider]  Cholecalciferol (VITAMIN D) 125 MCG (5000 UT) CAPS Take 5,000 Units by mouth daily.   Yes [provider]  diphenhydrAMINE (BENADRYL) 25 MG tablet 1 tablet Orally Once a day as needed   Yes [provider]  levalbuterol (XOPENEX HFA) 45 MCG/ACT inhaler Inhale into the lungs. 11/21/21 11/21/22 Yes [provider]  olmesartan (BENICAR) 5 MG tablet Take 5 mg by mouth daily.   Yes [provider]  RABEprazole (ACIPHEX) 20 MG tablet Take 1 tablet (20 mg total) by mouth daily. 02/16/22  Yes Corbin Ade, MD    Allergies as of 05/18/2022 - Review Complete 05/18/2022  Allergen Reaction Noted   Amitriptyline Shortness Of Breath 10/11/2014   Carbamazepine  Shortness Of Breath and Nausea And Vomiting 12/15/2020   Duloxetine Shortness Of Breath 01/22/2017   Gabapentin Shortness Of Breath 10/11/2014   Pregabalin Shortness Of Breath 10/11/2014   Sertraline Shortness Of Breath 09/26/2016   Hydrochlorothiazide Nausea And Vomiting 11/05/2019   Alprazolam  05/18/2022   Baclofen Hives 04/21/2019   Cyclobenzaprine  02/16/2022   Duloxetine hcl  02/16/2022   Lisinopril Other (See Comments) 10/17/2021   Meclizine hcl  02/16/2022   Pantoprazole sodium Other (See Comments) 12/19/2021    Prednisone  11/05/2019   Tizanidine  02/16/2022   Tramadol  05/18/2022   Travoprost  02/16/2022   Venlafaxine  02/16/2022   Amlodipine Rash 11/05/2019   Ibuprofen Nausea And Vomiting and Rash 04/21/2019    Family History  Problem Relation Age of Onset   HIV/AIDS Mother    Hypertension Father     Social History   Socioeconomic History   Marital status: Single    Spouse name: Not on file   Number of children: Not on file   Years of education: Not on file   Highest education level: Not on file  Occupational History   Not on file  Tobacco Use   Smoking status: Former   Smokeless tobacco: Never  Vaping Use   Vaping Use: Never used  Substance and Sexual Activity   Alcohol use: Not Currently    Comment: occaisional   Drug use: Yes    Types: Marijuana    Comment: last used last week   Sexual activity: Not Currently  Other Topics Concern   Not on file  Social History Narrative   Nephew lives with her   Social Determinants of Health   Financial Resource Strain: Not on file  Food Insecurity: Not on file  Transportation Needs: Not on file  Physical Activity: Not on file  Stress: Not on file  Social Connections: Not on file  Intimate Partner Violence: Not on file    Review of Systems: See HPI, otherwise negative ROS  Physical Exam: BP 96/65 (BP Location: Right Arm, Patient Position: Sitting, Cuff Size: Large)   Pulse (!) 54   Temp (!) 97.4 F (36.3 C) (Oral)   Ht 5\' 6"  (1.676 m)   Wt 226 lb 3.2 oz (102.6 kg)   LMP 04/15/2019   SpO2 97%   BMI 36.51 kg/m  General:   Alert,  Well-developed, well-nourished, pleasant and cooperative in NAD SNeck:  Supple; no masses or thyromegaly. No significant cervical adenopathy. Lungs:  Clear throughout to auscultation.   No wheezes, crackles, or rhonchi. No acute distress. Heart:  Regular rate and rhythm; no murmurs, clicks, rubs,  or gallops. Abdomen: Non-distended, normal bowel sounds.  Soft and nontender without  appreciable mass or hepatosplenomegaly.  Pulses:  Normal pulses noted. Extremities:  Without clubbing or edema.  Impression/Plan: 62 year old lady with GERD well-controlled on rabeprazole 20 mg daily.  No alarm symptoms.  Chronic constipation managed well with MiraLAX as needed.  She would likely be best served by taking MiraLAX every day to prevent constipation unless she has diarrhea.  History of colonic polyp due for surveillance 2029  History of IPMN  followed closely by [facility].  Recommendations:  Continue rabeprazole 20 mg daily for acid reflux  Continue MiraLAX or generic equivalent daily.  I would lean towards taking it every day and would hold off only in the case of diarrhea  You did have an H. pylori breath test which indicates the infection was eradicated.  You do not need any  further testing.  Keep your appointments at [facility] regarding the pancreas lesion  Plan for colonoscopy in thousand 29.  Office visit here in 1 year.   Notice: This dictation was prepared with Dragon dictation along with smaller phrase technology. Any transcriptional errors that result from this process are unintentional and may not be corrected upon review.

## 2022-05-23 ENCOUNTER — Encounter: Payer: Self-pay | Admitting: Internal Medicine

## 2022-05-28 ENCOUNTER — Other Ambulatory Visit (HOSPITAL_COMMUNITY): Payer: Self-pay | Admitting: Internal Medicine

## 2022-05-28 ENCOUNTER — Other Ambulatory Visit (HOSPITAL_COMMUNITY): Payer: Self-pay | Admitting: Family Medicine

## 2022-05-28 DIAGNOSIS — Z1231 Encounter for screening mammogram for malignant neoplasm of breast: Secondary | ICD-10-CM

## 2022-05-30 ENCOUNTER — Inpatient Hospital Stay (HOSPITAL_COMMUNITY): Admission: RE | Admit: 2022-05-30 | Payer: 59 | Source: Ambulatory Visit

## 2022-05-30 ENCOUNTER — Encounter (HOSPITAL_COMMUNITY): Payer: Self-pay

## 2022-05-30 ENCOUNTER — Ambulatory Visit (HOSPITAL_COMMUNITY)
Admission: RE | Admit: 2022-05-30 | Discharge: 2022-05-30 | Disposition: A | Discharge status: Home / Self Care | Payer: 59 | Source: Ambulatory Visit | Attending: Family Medicine | Admitting: Family Medicine

## 2022-05-30 DIAGNOSIS — Z1231 Encounter for screening mammogram for malignant neoplasm of breast: Secondary | ICD-10-CM

## 2022-06-28 ENCOUNTER — Other Ambulatory Visit: Payer: Self-pay | Admitting: Physical Medicine & Rehabilitation

## 2022-06-28 DIAGNOSIS — M5416 Radiculopathy, lumbar region: Secondary | ICD-10-CM

## 2022-07-10 ENCOUNTER — Other Ambulatory Visit: Payer: 59

## 2022-07-23 ENCOUNTER — Ambulatory Visit
Admission: RE | Admit: 2022-07-23 | Discharge: 2022-07-23 | Disposition: A | Discharge status: Home / Self Care | Payer: 59 | Source: Ambulatory Visit | Attending: Physical Medicine & Rehabilitation | Admitting: Physical Medicine & Rehabilitation

## 2022-07-23 DIAGNOSIS — M5416 Radiculopathy, lumbar region: Secondary | ICD-10-CM

## 2022-07-31 ENCOUNTER — Encounter: Payer: Self-pay | Admitting: Internal Medicine

## 2022-07-31 ENCOUNTER — Other Ambulatory Visit: Payer: Self-pay

## 2022-07-31 MED ORDER — ESOMEPRAZOLE MAGNESIUM 40 MG PO CPDR: 40.0000 mg | DELAYED_RELEASE_CAPSULE | Freq: Every day | ORAL | 3 refills | Status: DC

## 2022-07-31 NOTE — Telephone Encounter (Signed)
Please schedule pt a follow up appt with an app in 3 mths.

## 2022-08-01 NOTE — Telephone Encounter (Signed)
noted 

## 2022-10-10 ENCOUNTER — Encounter: Payer: Self-pay | Admitting: Internal Medicine

## 2022-10-30 ENCOUNTER — Encounter: Payer: Self-pay | Admitting: Gastroenterology

## 2022-10-30 ENCOUNTER — Ambulatory Visit (INDEPENDENT_AMBULATORY_CARE_PROVIDER_SITE_OTHER): Payer: 59 | Admitting: Gastroenterology

## 2022-10-30 VITALS — BP 124/81 | HR 66 | Temp 97.6°F | Ht 66.0 in | Wt 220.2 lb

## 2022-10-30 DIAGNOSIS — R1310 Dysphagia, unspecified: Secondary | ICD-10-CM | POA: Diagnosis not present

## 2022-10-30 DIAGNOSIS — K5900 Constipation, unspecified: Secondary | ICD-10-CM | POA: Diagnosis not present

## 2022-10-30 DIAGNOSIS — K219 Gastro-esophageal reflux disease without esophagitis: Secondary | ICD-10-CM

## 2022-10-30 DIAGNOSIS — K760 Fatty (change of) liver, not elsewhere classified: Secondary | ICD-10-CM | POA: Insufficient documentation

## 2022-10-30 DIAGNOSIS — K869 Disease of pancreas, unspecified: Secondary | ICD-10-CM

## 2022-10-30 MED ORDER — ESOMEPRAZOLE MAGNESIUM 40 MG PO CPDR: 40.0000 mg | DELAYED_RELEASE_CAPSULE | Freq: Every day | ORAL | 3 refills | Status: DC

## 2022-10-30 NOTE — Patient Instructions (Signed)
Continue esomeprazole 40 mg once daily before breakfast.  Prescription sent to your pharmacy. You have a history of fatty liver demonstrated on prior imaging test.  Your liver blood work has been normal.  See below for instructions for fatty liver.  At your next office visit we will consider fibrosis testing with blood work. Return to the office in 1 year or sooner if needed.  Instructions for fatty liver: Recommend 1-2# weight loss per week until ideal body weight through exercise & diet. Low fat/cholesterol diet.   Avoid sweets, sodas, fruit juices, sweetened beverages like tea, etc. Gradually increase exercise from 15 min daily up to 1 hr per day 5 days/week. Limit alcohol use.

## 2022-10-30 NOTE — Progress Notes (Signed)
GI Office Note    Referring Provider: Joana Reamer, DO Primary Care Physician:  Joana Reamer, DO  Primary Gastroenterologist: Roetta Sessions, MD   Chief Complaint   Chief Complaint  Patient presents with   Follow-up    Still has issues with stomach pains at time and states sometimes when it happens she will have a bm and sometimes she won't. Pt needs refills on esomeprazole.    History of Present Illness   Kara Jimenez is a 62 y.o. female presenting today for follow up GERD. Last seen in 05/2022. At that time was doing well on Aciphex 20mg  daily. H/o colonic adenoma removed in 2022, due surveillance in 2029. Also with history of constipation. H/o IPMN followed by [facility]. H/o h.pylori gastritis treated with eradication documented by breath testing. H/o hepatic steatosis with normal LFTs.  Nausea with pantoprazole. Aciphex stopped working per patient as noted by dysphagia to water and acid taste in mouth in mornings. Esomeprazole 40mg  daily started 07/2022.   Today: No regular heartburn.  Doing better on esomeprazole.  No dysphagia.  Trying to avoid trigger foods. Abdominal pain sometimes, migrates, sometimes associated with BM.  Overall abdominal pain much improved.  No melena, brbpr.  Slow intentional weight loss, trying to eat better.  Seen at [facility] in July for follow-up of IPMN with labs and MRI as outlined below.  Per patient scheduled for repeat labs and MRI in July 2025.  Labs from July 2024: White blood cell count 10,500, hemoglobin 14.4, platelets 229,000, sodium 137, potassium 4, BUN 29, creatinine 1, AST 18, ALT 15, total bilirubin 0.5, alkaline phosphatase 77, albumin 4.1, CEA 0.3, CA 19-9 14  MRI abdomen and MRCP with and without contrast July 2024: Impression:  1. 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 Similar prominence of the pancreatic  duct at the head of unclear significance, if any.   2.  Unchanged mild central intrahepatic and extrahepatic biliary ductal  dilatation since 2021.   EGD August 2022: -Normal esophagus status post dilation -Antral erosions status post biopsy, H. pylori gastritis  Colonoscopy August 2022: -Hemorrhoids found on perianal exam -one 5 mm polyp in the mid rectum removed, tubular adenoma -Diverticulosis in the sigmoid colon -Repeat colonoscopy in 7 years  Wt Readings from Last 10 Encounters:  10/30/22 220 lb 3.2 oz (99.9 kg)  05/18/22 226 lb 3.2 oz (102.6 kg)  02/16/22 221 lb 9.6 oz (100.5 kg)  11/28/21 222 lb (100.7 kg)  09/09/20 226 lb (102.5 kg)  09/07/20 226 lb (102.5 kg)  05/04/20 229 lb 12.8 oz (104.2 kg)  04/29/20 233 lb (105.7 kg)  11/25/19 224 lb (101.6 kg)  11/19/19 227 lb (103 kg)     Medications   Current Outpatient Medications  Medication Sig Dispense Refill   Brinzolamide-Brimonidine (SIMBRINZA) 1-0.2 % SUSP Place 1 drop into both eyes 3 (three) times daily.     Cholecalciferol (VITAMIN D) 125 MCG (5000 UT) CAPS Take 5,000 Units by mouth daily.     diphenhydrAMINE (BENADRYL) 25 MG tablet 1 tablet Orally Once a day as needed     esomeprazole (NEXIUM) 40 MG capsule Take 1 capsule (40 mg total) by mouth daily at 12 noon. 30 capsule 3   No current facility-administered medications for this visit.    Allergies   Allergies as of 10/30/2022 - Review Complete 10/30/2022  Allergen Reaction Noted   Amitriptyline Shortness Of  Breath 10/11/2014   Carbamazepine Shortness Of Breath and Nausea And Vomiting 12/15/2020   Duloxetine Shortness Of Breath 01/22/2017   Gabapentin Shortness Of Breath 10/11/2014   Pregabalin Shortness Of Breath 10/11/2014   Sertraline Shortness Of Breath 09/26/2016   Hydrochlorothiazide Nausea And Vomiting 11/05/2019   Albuterol  10/31/2021   Alprazolam  05/18/2022   Baclofen Hives 04/21/2019   Cyclobenzaprine  02/16/2022   Duloxetine hcl  02/16/2022   Lisinopril Other (See Comments) 10/17/2021    Meclizine hcl  02/16/2022   Pantoprazole sodium Other (See Comments) 12/19/2021   Prednisone  11/05/2019   Tizanidine  02/16/2022   Tramadol  05/18/2022   Travoprost  02/16/2022   Venlafaxine  02/16/2022   Amlodipine Rash 11/05/2019   Ibuprofen Nausea And Vomiting and Rash 04/21/2019      Review of Systems   General: Negative for anorexia, weight loss, fever, chills, fatigue, weakness. ENT: Negative for hoarseness, difficulty swallowing , nasal congestion. CV: Negative for chest pain, angina, palpitations, dyspnea on exertion, peripheral edema.  Respiratory: Negative for dyspnea at rest, dyspnea on exertion, cough, sputum, wheezing.  GI: See history of present illness. GU:  Negative for dysuria, hematuria, urinary incontinence, urinary frequency, nocturnal urination.  Endo: Negative for unusual weight change.     Physical Exam   BP 124/81 (BP Location: Right Arm, Patient Position: Sitting, Cuff Size: Large)   Pulse 66   Temp 97.6 F (36.4 C) (Oral)   Ht 5\' 6"  (1.676 m)   Wt 220 lb 3.2 oz (99.9 kg)   LMP 04/15/2019   SpO2 96%   BMI 35.54 kg/m    General: Well-nourished, well-developed in no acute distress.  Eyes: No icterus. Mouth: Oropharyngeal mucosa moist and pink  Abdomen: Bowel sounds are normal, nontender, nondistended, no hepatosplenomegaly or masses,  no abdominal bruits or hernia , no rebound or guarding.  Rectal: not performed  Extremities: No lower extremity edema. No clubbing or deformities. Neuro: Alert and oriented x 4   Skin: Warm and dry, no jaundice.   Psych: Alert and cooperative, normal mood and affect.  Labs   See hpi  Imaging Studies   No results found.  Assessment/plan:   GERD/dysphagia -Doing better on esomeprazole -Reinforced antireflux measures -Return visit in 1 year  Constipation -Doing well  Pancreatic lesion -Followed at [facility], imaging up-to-date with plans for repeat labs and MRI in July 2025  Hepatic steatosis -Normal  LFTs -Reinforced low sugar/low-fat diet, ongoing weight loss towards healthy BMI.  Patient does not consume alcohol.  Increase daily activity. -Consider fibrosis testing after next office visit -Return visit in 1 year  Kara Jimenez. Melvyn Neth, MHS, PA-C [facility]

## 2022-11-30 ENCOUNTER — Other Ambulatory Visit: Payer: Self-pay | Admitting: Sports Medicine

## 2022-11-30 DIAGNOSIS — M7702 Medial epicondylitis, left elbow: Secondary | ICD-10-CM

## 2022-12-24 ENCOUNTER — Ambulatory Visit
Admission: RE | Admit: 2022-12-24 | Discharge: 2022-12-24 | Disposition: A | Discharge status: Home / Self Care | Payer: 59 | Source: Ambulatory Visit | Attending: Sports Medicine | Admitting: Sports Medicine

## 2022-12-24 DIAGNOSIS — M7702 Medial epicondylitis, left elbow: Secondary | ICD-10-CM

## 2023-04-16 ENCOUNTER — Other Ambulatory Visit: Payer: Self-pay | Admitting: Physical Medicine & Rehabilitation

## 2023-04-16 DIAGNOSIS — M5416 Radiculopathy, lumbar region: Secondary | ICD-10-CM

## 2023-04-20 ENCOUNTER — Ambulatory Visit
Admission: RE | Admit: 2023-04-20 | Discharge: 2023-04-20 | Disposition: A | Discharge status: Home / Self Care | Source: Ambulatory Visit | Attending: Physical Medicine & Rehabilitation | Admitting: Physical Medicine & Rehabilitation

## 2023-04-20 DIAGNOSIS — M5416 Radiculopathy, lumbar region: Secondary | ICD-10-CM

## 2023-05-02 ENCOUNTER — Encounter: Payer: Self-pay | Admitting: Gastroenterology

## 2023-05-23 ENCOUNTER — Other Ambulatory Visit (HOSPITAL_COMMUNITY): Payer: Self-pay | Admitting: Family Medicine

## 2023-05-23 DIAGNOSIS — Z1231 Encounter for screening mammogram for malignant neoplasm of breast: Secondary | ICD-10-CM

## 2023-05-27 ENCOUNTER — Encounter (HOSPITAL_COMMUNITY): Payer: Self-pay

## 2023-06-03 ENCOUNTER — Ambulatory Visit (HOSPITAL_COMMUNITY)
Admission: RE | Admit: 2023-06-03 | Discharge: 2023-06-03 | Disposition: A | Discharge status: Home / Self Care | Source: Ambulatory Visit | Attending: Family Medicine | Admitting: Family Medicine

## 2023-06-03 DIAGNOSIS — Z1231 Encounter for screening mammogram for malignant neoplasm of breast: Secondary | ICD-10-CM | POA: Diagnosis present

## 2023-10-03 ENCOUNTER — Encounter: Payer: Self-pay | Admitting: Internal Medicine

## 2023-10-04 ENCOUNTER — Other Ambulatory Visit (HOSPITAL_COMMUNITY): Payer: Self-pay | Admitting: Family Medicine

## 2023-10-04 DIAGNOSIS — R634 Abnormal weight loss: Secondary | ICD-10-CM

## 2023-10-15 ENCOUNTER — Ambulatory Visit (HOSPITAL_COMMUNITY)
Admission: RE | Admit: 2023-10-15 | Discharge: 2023-10-15 | Disposition: A | Discharge status: Home / Self Care | Source: Ambulatory Visit | Attending: Family Medicine | Admitting: Family Medicine

## 2023-10-15 DIAGNOSIS — R634 Abnormal weight loss: Secondary | ICD-10-CM | POA: Diagnosis present

## 2023-11-18 ENCOUNTER — Encounter: Payer: Self-pay | Admitting: Internal Medicine

## 2023-11-18 ENCOUNTER — Ambulatory Visit: Admitting: Internal Medicine

## 2023-11-18 VITALS — BP 120/77 | HR 71 | Temp 97.7°F | Ht 66.0 in | Wt 216.0 lb

## 2023-11-18 DIAGNOSIS — K760 Fatty (change of) liver, not elsewhere classified: Secondary | ICD-10-CM | POA: Diagnosis not present

## 2023-11-18 NOTE — Patient Instructions (Signed)
 It was nice to see you again today  Looks like you do not have steady weight loss.  Your weight in our chart not far from your baseline here.  Continue esomeprazole  40 mg daily  By report, your thyroid function is normal as checked by Dr. Halbert  CMET and ELF today to check on your liver further.  Nothing else needs to be done with your pancreas other than follow-up with [facility] as specified  Colon polyp in 2022; recommend repeat colonoscopy 2029  Please drop by the office in 1 month and let us  weigh you on our scales once again.  The dilation of your bile duct is chronic and has been seen on your MRIs previously.  Finding on ultrasound is nothing to be alarmed about.  It has been there for some time.  Your liver enzymes have been normal which is also good news.  Further recommendations to follow.

## 2023-11-18 NOTE — Progress Notes (Unsigned)
 Gastroenterology Progress Note    Primary Care Physician:  Halbert Mariano SQUIBB, DO Primary Gastroenterologist:  Dr. Shaaron  Pre-Procedure History & Physical: HPI:  Kara Jimenez is a 63 y.o. female here for reported 10 pound weight loss over 4 weeks.  Also, fatty liver seen on recent ultrasound and biliary dilation.  Simple hepatic cyst.  Patient states she has already gained 4 pounds back.  Unsure why her weight is down.  She weighs 216today; was 222 in her office 2 years ago.  She does wear thyroid has been checked and is normal.  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  Just had her yearly MRI in July.  Via care everywhere, it looks like her biliary dilation is a chronic finding and the sidebranch lesion is not increased in size (1.4 cm diameter).  She is already set up for 1 year MRI.  Does have a fatty liver with normal LFTs she remains significantly obese.  She does not have any reflux symptoms well-controlled on esomeprazole  no early satiety nausea or vomiting no melena rectal bleeding bowel function is described as good.  History of colonic adenoma removed 2022/due for surveillance colonoscopy 2029.  History of H. pylori gastritis found on EGD.  She was treated.  Eradication posttreatment was proven.  Has a burden of taking care of her cousin who lives with her who recently was hospitalized with a CVA.  Past Medical History:  Diagnosis Date   Anemia    Arthritis    Breast injury    Carpal tunnel syndrome    Complication of anesthesia    pt. states that her HR dropped to 20s when waking up after apppendectomy   Fibromyalgia    GERD (gastroesophageal reflux disease)    Headache    Heart murmur    Hypertension    Sciatica    Sleep apnea     Past Surgical History:  Procedure Laterality Date   BIOPSY  09/09/2020   Procedure: BIOPSY;  Surgeon: Shaaron Lamar HERO, MD;  [facility];   Service: Endoscopy;;   BREAST EXCISIONAL BIOPSY Right    benign   CARPAL TUNNEL RELEASE Bilateral    CESAREAN SECTION     COLONOSCOPY WITH PROPOFOL  N/A 09/09/2020   Procedure: COLONOSCOPY WITH PROPOFOL ;  Surgeon: Shaaron Lamar HERO, MD;  Location: [facility];  Service: Endoscopy;  Laterality: N/A;  12:30pm   ESOPHAGOGASTRODUODENOSCOPY (EGD) WITH PROPOFOL  N/A 09/09/2020   Procedure: ESOPHAGOGASTRODUODENOSCOPY (EGD) WITH PROPOFOL ;  Surgeon: Shaaron Lamar HERO, MD;  [facility];  Service: Endoscopy;  Laterality: N/A;   LAPAROSCOPIC APPENDECTOMY N/A 11/05/2019   Procedure: APPENDECTOMY LAPAROSCOPIC;  Surgeon: Kallie Manuelita BROCKS, MD;  Location: [facility];  Service: General;  Laterality: N/A;   MALONEY DILATION N/A 09/09/2020   Procedure: AGAPITO HODGKIN;  Surgeon: Shaaron Lamar HERO, MD;  Location: [facility];  Service: Endoscopy;  Laterality: N/A;   NECK SURGERY     PARTIAL HYSTERECTOMY     POLYPECTOMY  09/09/2020   Procedure: POLYPECTOMY INTESTINAL;  Surgeon: Shaaron Lamar HERO, MD;  Location: [facility];  Service: Endoscopy;;   ROTATOR CUFF REPAIR Left    TUBAL LIGATION      Prior to Admission medications   Medication Sig Start Date End Date Taking? Authorizing Provider  Brinzolamide-Brimonidine [facility]) 1-0.2 % SUSP Place 1 drop into both eyes 3 (three) times daily. 02/27/17  Yes [provider]  Cholecalciferol (VITAMIN D) 125 MCG (5000 UT) CAPS Take 5,000 Units by mouth daily.   Yes [provider]  diphenhydrAMINE  (BENADRYL ) 25 MG tablet 1 tablet Orally Once a day as needed   Yes [provider]  esomeprazole  (NEXIUM ) 40 MG capsule Take 1 capsule (40 mg total) by mouth daily before breakfast. 10/30/22  Yes Ezzard Sonny RAMAN, PA-C  loratadine (CLARITIN) 10 MG tablet Take 10 mg by mouth daily.    [provider]    Allergies as of 11/18/2023 - Review Complete 11/18/2023  Allergen Reaction Noted   Amitriptyline Shortness Of Breath 10/11/2014    Carbamazepine Shortness Of Breath and Nausea And Vomiting 12/15/2020   Duloxetine Shortness Of Breath 01/22/2017   Gabapentin Shortness Of Breath 10/11/2014   Pregabalin Shortness Of Breath 10/11/2014   Sertraline Shortness Of Breath 09/26/2016   Hydrochlorothiazide  Nausea And Vomiting 11/05/2019   Albuterol  10/31/2021   Alprazolam  05/18/2022   Baclofen Hives 04/21/2019   Cyclobenzaprine  02/16/2022   Duloxetine hcl  02/16/2022   Lisinopril Other (See Comments) 10/17/2021   Meclizine hcl  02/16/2022   Pantoprazole  sodium Other (See Comments) 12/19/2021   Prednisone  11/05/2019   Tizanidine  02/16/2022   Tramadol  05/18/2022   Travoprost  02/16/2022   Venlafaxine  02/16/2022   Amlodipine Rash 11/05/2019   Dog epithelium (canis lupus familiaris) Dermatitis and Swelling 12/18/2019   Ibuprofen Nausea And Vomiting and Rash 04/21/2019   Lamotrigine Dermatitis 04/18/2023    Family History  Problem Relation Age of Onset   HIV/AIDS Mother    Hypertension Father     Social History   Socioeconomic History   Marital status: Single    Spouse name: Not on file   Number of children: Not on file   Years of education: Not on file   Highest education level: Not on file  Occupational History   Not on file  Tobacco Use   Smoking status: Former   Smokeless tobacco: Never  Vaping Use   Vaping status: Never Used  Substance and Sexual Activity   Alcohol use: Not Currently    Comment: occaisional   Drug use: Yes    Types: Marijuana    Comment: last used last week   Sexual activity: Not Currently  Other Topics Concern   Not on file  Social History Narrative   Nephew lives with her   Social Drivers of Health   Financial Resource Strain: Low Risk  (08/28/2023)   Received from [facility]   Overall Financial Resource Strain (CARDIA)    Difficulty of Paying Living Expenses: Not hard at all  Food Insecurity: No Food Insecurity (08/28/2023)   Received from [facility]   Hunger Vital Sign    Within the past 12 months, you worried that your food would run out before you got the money to buy more.: Never true    Within the past 12 months, the food you bought just didn't last and you didn't have money to get more.: Never true  Transportation Needs: No Transportation Needs (08/28/2023)   Received from [facility] - Transportation    In the past 12 months, has lack of transportation kept you from medical appointments or from getting medications?: No    Lack of Transportation (Non-Medical): No  Physical Activity: Insufficiently Active (05/21/2023)   Received from [facility]   Exercise Vital Sign  On average, how many days per week do you engage in moderate to strenuous exercise (like a brisk walk)?: 2 days    On average, how many minutes do you engage in exercise at this level?: 10 min  Stress: Stress Concern Present (05/21/2023)   Received from [facility] Occupational Health - Occupational Stress Questionnaire    Feeling of Stress : To some extent  Social Connections: Socially Isolated (05/21/2023)   Received from [facility]   Social Connection and Isolation Panel    In a typical week, how many times do you talk on the phone with family, friends, or neighbors?: Three times a week    How often do you get together with friends or relatives?: More than three times a week    How often do you attend church or religious services?: Never    Do you belong to any clubs or organizations such as church groups, unions, fraternal or athletic groups, or school groups?: No    How often do you attend meetings of the clubs or organizations you belong to?: Never    Are you married, widowed, divorced, separated, never married, or living with a partner?: Never married  Intimate Partner Violence: Not on file    Review of Systems   See HPI, otherwise  negative ROS  Physical Exam: BP 120/77 (BP Location: Right Arm, Patient Position: Sitting, Cuff Size: Large)   Pulse 71   Temp 97.7 F (36.5 C) (Oral)   Ht 5' 6 (1.676 m)   Wt 216 lb (98 kg)   LMP 04/15/2019   SpO2 98%   BMI 34.86 kg/m  General:   Alert,  Well-developed, well-nourished, pleasant and cooperative in NAD Heart:  Regular rate and rhythm; no murmurs, clicks, rubs,  or gallops. Abdomen: Obese.  Non-distended, normal bowel sounds.  Soft and nontender without appreciable mass or hepatosplenomegaly.    Impression/Plan:   64 year old obese lady referred for 10 pound drop in weight over a month which seems to have been spontaneous.  By her report, she has gained 4 pounds back.  She is not far off from her weight here assessed 2 years ago.  She feels well does not have any GI symptoms.  Reportedly her thyroid function is normal.  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  She is actively surveilled at [facility].  Recent ultrasound redemonstrated chronic biliary dilation.  LFTs normal.  She does have a fatty appearing liver on imaging.  Reflux well-controlled.  History of colonic adenoma-due for surveillance colonoscopy 2029.  Recent fluctuation in weight less likely to be significant.  She is under quite a bit of stress at home dealing with her cousin and his recent stroke.  Recommendations:  Looks like you do not have steady weight loss.  Your weight in our chart not far from your baseline here.  Continue esomeprazole  40 mg daily  By report, your thyroid function is normal as checked by Dr. Halbert  CMET and ELF today to check on your liver further.  Nothing else needs to be done with your pancreas other than follow-up with [facility] as specified  Colon polyp in 2022; recommend repeat colonoscopy 2029  Please drop by the office in 1 month and let us  weigh you on our scales once again.  The dilation of your bile duct is chronic and has  been seen on your MRIs previously.  Finding on ultrasound is  nothing to be alarmed about.  It has been there for some time.  Your liver enzymes have been normal which is also good news.  Further recommendations to follow.   Notice: This dictation was prepared with Dragon dictation along with smaller phrase technology. Any transcriptional errors that result from this process are unintentional and may not be corrected upon review.

## 2023-11-22 ENCOUNTER — Ambulatory Visit: Payer: Self-pay | Admitting: Internal Medicine

## 2023-11-26 ENCOUNTER — Emergency Department (HOSPITAL_COMMUNITY)

## 2023-11-26 ENCOUNTER — Other Ambulatory Visit: Payer: Self-pay

## 2023-11-26 ENCOUNTER — Emergency Department (HOSPITAL_COMMUNITY)
Admission: EM | Admit: 2023-11-26 | Discharge: 2023-11-26 | Disposition: A | Discharge status: Home / Self Care | Attending: Emergency Medicine | Admitting: Emergency Medicine

## 2023-11-26 ENCOUNTER — Encounter (HOSPITAL_COMMUNITY): Payer: Self-pay

## 2023-11-26 DIAGNOSIS — R55 Syncope and collapse: Secondary | ICD-10-CM | POA: Insufficient documentation

## 2023-11-26 DIAGNOSIS — R42 Dizziness and giddiness: Secondary | ICD-10-CM | POA: Insufficient documentation

## 2023-11-26 LAB — COMPREHENSIVE METABOLIC PANEL WITH GFR
ALT: 9 IU/L (ref 0–32)
ALT: 11 U/L (ref 0–44)
AST: 12 IU/L (ref 0–40)
AST: 17 U/L (ref 15–41)
Albumin: 4.2 g/dL (ref 3.9–4.9)
Albumin: 4.2 g/dL (ref 3.5–5.0)
Alkaline Phosphatase: 89 IU/L (ref 49–135)
Alkaline Phosphatase: 87 U/L (ref 38–126)
Anion gap: 12 (ref 5–15)
BUN/Creatinine Ratio: 17 (ref 12–28)
BUN: 15 mg/dL (ref 8–27)
BUN: 14 mg/dL (ref 8–23)
Bilirubin Total: 0.3 mg/dL (ref 0.0–1.2)
CO2: 25 mmol/L (ref 20–29)
CO2: 26 mmol/L (ref 22–32)
Calcium: 9.4 mg/dL (ref 8.7–10.3)
Calcium: 9.4 mg/dL (ref 8.9–10.3)
Chloride: 105 mmol/L (ref 96–106)
Chloride: 105 mmol/L (ref 98–111)
Creatinine, Ser: 0.88 mg/dL (ref 0.57–1.00)
Creatinine, Ser: 0.79 mg/dL (ref 0.44–1.00)
GFR, Estimated: >60 mL/min (ref >60)
Globulin, Total: 2.8 g/dL (ref 1.5–4.5)
Glucose, Bld: 105 mg/dL — ABNORMAL HIGH (ref 70–99)
Glucose: 89 mg/dL (ref 70–99)
Potassium: 4.1 mmol/L (ref 3.5–5.2)
Potassium: 3.9 mmol/L (ref 3.5–5.1)
Sodium: 143 mmol/L (ref 134–144)
Sodium: 143 mmol/L (ref 135–145)
Total Bilirubin: 0.3 mg/dL (ref 0.0–1.2)
Total Protein: 7 g/dL (ref 6.0–8.5)
Total Protein: 7.4 g/dL (ref 6.5–8.1)
eGFR: 74 mL/min/1.73 (ref >59)

## 2023-11-26 LAB — CBC WITH DIFFERENTIAL/PLATELET
Abs Immature Granulocytes: 0.02 K/uL (ref 0.00–0.07)
Basophils Absolute: 0 K/uL (ref 0.0–0.1)
Basophils Relative: 1 %
Eosinophils Absolute: 0 K/uL (ref 0.0–0.5)
Eosinophils Relative: 1 %
HCT: 42 % (ref 36.0–46.0)
Hemoglobin: 14.2 g/dL (ref 12.0–15.0)
Immature Granulocytes: 0 %
Lymphocytes Relative: 24 %
Lymphs Abs: 2 K/uL (ref 0.7–4.0)
MCH: 32.2 pg (ref 26.0–34.0)
MCHC: 33.8 g/dL (ref 30.0–36.0)
MCV: 95.2 fL (ref 80.0–100.0)
Monocytes Absolute: 0.4 K/uL (ref 0.1–1.0)
Monocytes Relative: 5 %
Neutro Abs: 5.8 K/uL (ref 1.7–7.7)
Neutrophils Relative %: 69 %
Platelets: 242 K/uL (ref 150–400)
RBC: 4.41 MIL/uL (ref 3.87–5.11)
RDW: 13 % (ref 11.5–15.5)
WBC: 8.4 K/uL (ref 4.0–10.5)
nRBC: 0 % (ref 0.0–0.2)

## 2023-11-26 LAB — TROPONIN T, HIGH SENSITIVITY
Troponin T High Sensitivity: <15 ng/L (ref 0–19)
Troponin T High Sensitivity: <15 ng/L (ref 0–19)

## 2023-11-26 LAB — URINALYSIS, ROUTINE W REFLEX MICROSCOPIC
Bilirubin Urine: NEGATIVE
Glucose, UA: NEGATIVE mg/dL
Hgb urine dipstick: NEGATIVE
Ketones, ur: 5 mg/dL — AB
Leukocytes,Ua: NEGATIVE
Nitrite: NEGATIVE
Protein, ur: NEGATIVE mg/dL
Specific Gravity, Urine: 1.016 (ref 1.005–1.030)
pH: 5 (ref 5.0–8.0)

## 2023-11-26 LAB — LIPASE, BLOOD: Lipase: 17 U/L (ref 11–51)

## 2023-11-26 LAB — ENHANCED LIVER FIBROSIS (ELF): ELF(TM) Score: 8.18 (ref <9.80)

## 2023-11-26 MED ORDER — ONDANSETRON HCL 4 MG/2ML IJ SOLN
4.0000 mg | Freq: Once | INTRAMUSCULAR | Status: DC
  Filled 2023-11-26: qty 2

## 2023-11-26 MED ORDER — SODIUM CHLORIDE 0.9 % IV BOLUS
500.0000 mL | Freq: Once | INTRAVENOUS | Status: AC
  Administered 2023-11-26: 500 mL via INTRAVENOUS

## 2023-11-26 NOTE — ED Triage Notes (Signed)
 Pt arrived via [facility]; Pt had syncopal episode last night, No LOC. Pt complains of N/V. Pt doctor told her possible stroke and to go to hospital. Pt negative for stroke scale, no blurred vision, no headache, just dizziness. BP elevated w/ EMS 170's-200. 4 of zofran  given by EMS.

## 2023-11-26 NOTE — ED Notes (Signed)
 Pt ambulated to restroom w/o assistance. Gait steady; pt stated she felt lightheaded.

## 2023-11-26 NOTE — Discharge Instructions (Signed)
 You were seen in the emergency department for dizziness faintness.  You had lab work EKG and a CAT scan of your head that did not show an obvious explanation for your symptoms.  Please rest and keep well-hydrated.  Follow-up with your primary care doctor.  Return to the Emergency Department if any worsening or concerning symptoms

## 2023-11-26 NOTE — ED Provider Notes (Signed)
 [facility]ER[facility]ENC[facility] DEPARTMENT AT [facility] Provider Note   CSN: [id number] Arrival date & time: 11/26/23  1408     Patient presents with: Near Syncope and Hypertension   Kara Jimenez is a 63 y.o. female.  She is brought in by ambulance from home.  She said she stood up last night and felt acutely dizzy and lightheaded and thinks she might of passed out for seconds.  Denies hitting her head.  Today she has felt similarly dizzy and has been very nauseous and had some vomiting of some liquid.  No headache chest pain shortness of breath abdominal pain diarrhea urinary symptoms.  No numbness or weakness other than her chronic carpal tunnel and sciatica.  No recent medication changes.  She said she has had this dizziness before when she was on blood pressure medicine and her doctor took her off.  {Add pertinent medical, surgical, social history, OB history to YEP:67052} The history is provided by the patient.  Near Syncope This is a new problem. The current episode started yesterday. The problem has been resolved. Pertinent negatives include no chest pain, no abdominal pain and no shortness of breath. The symptoms are aggravated by standing. Nothing relieves the symptoms. She has tried nothing for the symptoms. The treatment provided no relief.       Prior to Admission medications   Medication Sig Start Date End Date Taking? Authorizing Provider  Brinzolamide-Brimonidine [facility]) 1-0.2 % SUSP Place 1 drop into both eyes 3 (three) times daily. 02/27/17   [provider]  Cholecalciferol (VITAMIN D) 125 MCG (5000 UT) CAPS Take 5,000 Units by mouth daily.    [provider]  diphenhydrAMINE  (BENADRYL ) 25 MG tablet 1 tablet Orally Once a day as needed    [provider]  esomeprazole  (NEXIUM ) 40 MG capsule Take 1 capsule (40 mg total) by mouth daily before breakfast. 10/30/22   Ezzard Sonny RAMAN, PA-C  loratadine (CLARITIN) 10 MG tablet Take 10 mg by  mouth daily.    [provider]    Allergies: Amitriptyline, Carbamazepine, Duloxetine, Duloxetine hcl, Gabapentin, Pregabalin, Sertraline, Venlafaxine, Albuterol, Alprazolam, Cyclobenzaprine, Hydrochlorothiazide , Ibuprofen, Prednisone, Tramadol, Amlodipine, Baclofen, Dog epithelium (canis lupus familiaris), Lamotrigine, Lisinopril, Meclizine hcl, Pantoprazole  sodium, Tizanidine, and Travoprost    Review of Systems  Constitutional:  Negative for fever.  Respiratory:  Negative for shortness of breath.   Cardiovascular:  Positive for near-syncope. Negative for chest pain.  Gastrointestinal:  Positive for nausea and vomiting. Negative for abdominal pain.  Genitourinary:  Negative for dysuria.  Skin:  Negative for rash.  Neurological:  Positive for dizziness, syncope and light-headedness.    Updated Vital Signs BP (!) 142/88   Pulse 74   Temp 97.8 F (36.6 C) (Oral)   Resp 13   Ht 5' 6 (1.676 m)   Wt 98.9 kg   LMP 04/15/2019   SpO2 94%   BMI 35.19 kg/m   Physical Exam Vitals and nursing note reviewed.  Constitutional:      General: She is not in acute distress.    Appearance: Normal appearance. She is well-developed.  HENT:     Head: Normocephalic and atraumatic.  Eyes:     Conjunctiva/sclera: Conjunctivae normal.  Cardiovascular:     Rate and Rhythm: Normal rate and regular rhythm.     Heart sounds: No murmur heard. Pulmonary:     Effort: Pulmonary effort is normal. No respiratory distress.     Breath sounds: Normal breath sounds.  Abdominal:  Palpations: Abdomen is soft.     Tenderness: There is no abdominal tenderness. There is no guarding or rebound.  Musculoskeletal:        General: No deformity.     Cervical back: Neck supple.  Skin:    General: Skin is warm and dry.     Capillary Refill: Capillary refill takes less than 2 seconds.  Neurological:     General: No focal deficit present.     Mental Status: She is alert and oriented to person, place,  and time.     Cranial Nerves: No cranial nerve deficit.     Sensory: No sensory deficit.     Motor: No weakness.     (all labs ordered are listed, but only abnormal results are displayed) Labs Reviewed  COMPREHENSIVE METABOLIC PANEL WITH GFR  LIPASE, BLOOD  CBC WITH DIFFERENTIAL/PLATELET  URINALYSIS, ROUTINE W REFLEX MICROSCOPIC  TROPONIN T, HIGH SENSITIVITY    EKG: None  Radiology: No results found.  {Document cardiac monitor, telemetry assessment procedure when appropriate:32947} Procedures   Medications Ordered in the ED  ondansetron  (ZOFRAN ) injection 4 mg (has no administration in time range)  sodium chloride  0.9 % bolus 500 mL (has no administration in time range)      {Click here for ABCD2, HEART and other calculators REFRESH Note before signing:1}                              Medical Decision Making Amount and/or Complexity of Data Reviewed Labs: ordered. Radiology: ordered.  Risk Prescription drug management.   This patient complains of ***; this involves an extensive number of treatment Options and is a complaint that carries with it a high risk of complications and morbidity. The differential includes ***  I ordered, reviewed and interpreted labs, which included *** I ordered medication *** and reviewed PMP when indicated. I ordered imaging studies which included *** and I independently    visualized and interpreted imaging which showed *** Additional history obtained from *** Previous records obtained and reviewed *** I consulted *** and discussed lab and imaging findings and discussed disposition.  Cardiac monitoring reviewed, *** Social determinants considered, *** Critical Interventions: ***  After the interventions stated above, I reevaluated the patient and found *** Admission and further testing considered, ***   {Document critical care time when appropriate  Document review of labs and clinical decision tools ie CHADS2VASC2, etc   Document your independent review of radiology images and any outside records  Document your discussion with family members, caretakers and with consultants  Document social determinants of health affecting pt's care  Document your decision making why or why not admission, treatments were needed:32947:::1}   Final diagnoses:  None    ED Discharge Orders     None

## 2023-12-04 ENCOUNTER — Other Ambulatory Visit: Payer: Self-pay | Admitting: Gastroenterology

## 2023-12-10 ENCOUNTER — Encounter: Payer: Self-pay | Admitting: Internal Medicine

## 2023-12-23 ENCOUNTER — Telehealth: Payer: Self-pay

## 2023-12-23 NOTE — Telephone Encounter (Signed)
 noted

## 2023-12-23 NOTE — Telephone Encounter (Signed)
 Pt was made aware.

## 2023-12-23 NOTE — Telephone Encounter (Signed)
 Pt came by the office for a weight check. Pt's weight today was 215.6 lb pt states that it continues to go up and down by 3 lbs or so.

## 2024-01-27 ENCOUNTER — Other Ambulatory Visit: Payer: Self-pay | Admitting: Physical Medicine & Rehabilitation

## 2024-01-27 DIAGNOSIS — M5414 Radiculopathy, thoracic region: Secondary | ICD-10-CM

## 2024-02-04 ENCOUNTER — Ambulatory Visit: Admitting: Internal Medicine

## 2024-02-04 ENCOUNTER — Ambulatory Visit
Admission: RE | Admit: 2024-02-04 | Discharge: 2024-02-04 | Disposition: A | Source: Ambulatory Visit | Attending: Physical Medicine & Rehabilitation | Admitting: Physical Medicine & Rehabilitation

## 2024-02-04 DIAGNOSIS — M5414 Radiculopathy, thoracic region: Secondary | ICD-10-CM

## 2024-02-11 ENCOUNTER — Ambulatory Visit: Admitting: Internal Medicine

## 2024-02-16 ENCOUNTER — Encounter (INDEPENDENT_AMBULATORY_CARE_PROVIDER_SITE_OTHER): Payer: Self-pay

## 2024-02-18 ENCOUNTER — Ambulatory Visit: Admitting: Internal Medicine

## 2024-03-09 ENCOUNTER — Ambulatory Visit: Admitting: Internal Medicine
